# Patient Record
Sex: Male | Born: 1985 | Race: Black or African American | Hispanic: No | Marital: Single | Smoking: Current every day smoker
Health system: Southern US, Community
[De-identification: ages and names within clinical notes are randomized; demographics above are authoritative.]

## PROBLEM LIST (undated history)

## (undated) DIAGNOSIS — F32A Depression, unspecified: Secondary | ICD-10-CM

## (undated) DIAGNOSIS — F419 Anxiety disorder, unspecified: Secondary | ICD-10-CM

## (undated) HISTORY — DX: Anxiety disorder, unspecified: F41.9

## (undated) HISTORY — PX: NO PAST SURGERIES: SHX2092

## (undated) HISTORY — DX: Depression, unspecified: F32.A

---

## 2020-11-20 NOTE — Progress Notes (Signed)
Patient did not show for appointment.   

## 2020-11-21 ENCOUNTER — Telehealth: Payer: Self-pay | Admitting: Family

## 2020-11-21 ENCOUNTER — Encounter: Payer: Self-pay | Admitting: Family

## 2020-11-21 DIAGNOSIS — Z7689 Persons encountering health services in other specified circumstances: Secondary | ICD-10-CM

## 2020-11-24 ENCOUNTER — Ambulatory Visit (INDEPENDENT_AMBULATORY_CARE_PROVIDER_SITE_OTHER): Payer: Self-pay | Admitting: Physician Assistant

## 2020-11-24 ENCOUNTER — Other Ambulatory Visit: Payer: Self-pay

## 2020-11-24 ENCOUNTER — Ambulatory Visit (INDEPENDENT_AMBULATORY_CARE_PROVIDER_SITE_OTHER): Payer: Self-pay

## 2020-11-24 VITALS — BP 125/80 | HR 64 | Temp 97.4°F | Resp 18 | Ht 71.0 in | Wt 180.0 lb

## 2020-11-24 DIAGNOSIS — Z1159 Encounter for screening for other viral diseases: Secondary | ICD-10-CM

## 2020-11-24 DIAGNOSIS — Z114 Encounter for screening for human immunodeficiency virus [HIV]: Secondary | ICD-10-CM

## 2020-11-24 DIAGNOSIS — G8929 Other chronic pain: Secondary | ICD-10-CM

## 2020-11-24 DIAGNOSIS — M5441 Lumbago with sciatica, right side: Secondary | ICD-10-CM

## 2020-11-24 DIAGNOSIS — F411 Generalized anxiety disorder: Secondary | ICD-10-CM | POA: Insufficient documentation

## 2020-11-24 DIAGNOSIS — Z0000 Encounter for general adult medical examination without abnormal findings: Secondary | ICD-10-CM

## 2020-11-24 DIAGNOSIS — F5104 Psychophysiologic insomnia: Secondary | ICD-10-CM

## 2020-11-24 MED ORDER — HYDROXYZINE HCL 25 MG PO TABS: ORAL_TABLET | ORAL | 0 refills | Status: DC

## 2020-11-24 NOTE — Progress Notes (Signed)
**Note Randy-Identified via Obfuscation** New Patient Office Visit  Subjective:  Patient ID: Randy Braun, male    DOB: May 05, 1986  Age: 35 y.o. MRN: [id number]  CC:  Chief Complaint  Patient presents with   Establish Care    HPI Randy Braun presents as a new patient with several complaints.  Reports that he has been having elevated anxiety, difficulty sleeping, and back pain.  Reports that he has not been seen by a medical provider in 1 the past 20 years.  Reports that he has been experiencing elevated anxiety, believes that it is stress related.  States that when he gets upset his chest becomes tight, and then he will experience an anxiety attack he said his last one was on Friday, November 24, 2020, states it lasted approximately 20 minutes.  Endorses increased stressors, recently moved, starting a new job.  States that he does not sleep well, states that he will toss and turn most of the night, has used melatonin in the past with relief.  Reports that he has chronic low back pain, states that he was diagnosed with scoliosis when he was younger.  Reports that he will occasionally have sciatic pain involved as well.  Reports his back pain can usually be treated with ibuprofen with relief but has been using kratom as well.  Reports that he has been taking the kratom on a daily basis, hoping that it will also help him with his anxiety Reports that he uses 3 g every day    History reviewed. No pertinent past medical history.  History reviewed. No pertinent surgical history.  History reviewed. No pertinent family history.  Social History   Socioeconomic History   Marital status: Single    Spouse name: Not on file   Number of children: Not on file   Years of education: Not on file   Highest education level: Not on file  Occupational History   Not on file  Tobacco Use   Smoking status: Never Smoker   Smokeless tobacco: Never Used  Substance and Sexual Activity   Alcohol use: Not Currently   Drug use:  Never   Sexual activity: Yes  Other Topics Concern   Not on file  Social History Narrative   Not on file   Social Determinants of Health   Financial Resource Strain: Not on file  Food Insecurity: Not on file  Transportation Needs: Not on file  Physical Activity: Not on file  Stress: Not on file  Social Connections: Not on file  Intimate Partner Violence: Not on file    ROS Review of Systems  Constitutional: Negative.   HENT: Negative.   Eyes: Negative.   Respiratory: Negative.   Cardiovascular: Negative.   Gastrointestinal: Negative.   Endocrine: Negative.   Genitourinary: Negative.   Musculoskeletal: Positive for back pain.  Skin: Negative.   Allergic/Immunologic: Negative.   Neurological: Negative.   Hematological: Negative.   Psychiatric/Behavioral: Positive for sleep disturbance. Negative for self-injury and suicidal ideas. The patient is nervous/anxious.     Objective:   Today's Vitals: BP 125/80 (BP Location: Left Arm, Patient Position: Sitting, Cuff Size: Normal)    Pulse 64    Temp (!) 97.4 F (36.3 C) (Oral)    Resp 18    Ht 5\' 11"  (1.803 m)    Wt 180 lb (81.6 kg)    SpO2 98%    BMI 25.10 kg/m   Physical Exam Vitals and nursing note reviewed.   BP 125/80 (BP Location: Left Arm, Patient Position: Sitting,  Cuff Size: Normal)    Pulse 64    Temp (!) 97.4 F (36.3 C) (Oral)    Resp 18    Ht 5\' 11"  (1.803 m)    Wt 180 lb (81.6 kg)    SpO2 98%    BMI 25.10 kg/m   General Appearance:    Alert, cooperative, no distress, appears stated age  Head:    Normocephalic, without obvious abnormality, atraumatic  Eyes:    PERRL, conjunctiva/corneas clear, EOM's intact, fundi    benign, both eyes       Ears:    Normal TM's and external ear canals, both ears  Nose:   Nares normal, septum midline, mucosa normal, no drainage   or sinus tenderness  Throat:   Lips, mucosa, and tongue normal; teeth and gums normal  Neck:   Supple, symmetrical, trachea midline, no  adenopathy;       thyroid:  No enlargement/tenderness/nodules; no carotid   bruit or JVD  Back:     Symmetric, no curvature, ROM normal, no CVA tenderness  Lungs:     Clear to auscultation bilaterally, respirations unlabored  Chest wall:    No tenderness or deformity  Heart:    Regular rate and rhythm, S1 and S2 normal, no murmur, rub   or gallop           Extremities:   Extremities normal, atraumatic, no cyanosis or edema  Pulses:   2+ and symmetric all extremities  Skin:   Skin color, texture, turgor normal, no rashes or lesions  Lymph nodes:   Cervical, supraclavicular, and axillary nodes normal  Neurologic:   CNII-XII intact. Normal strength, sensation and reflexes      throughout     Assessment & Plan:   Problem List Items Addressed This Visit      Nervous and Auditory   Chronic right-sided low back pain with right-sided sciatica - Primary   Relevant Medications   hydrOXYzine (ATARAX/VISTARIL) 25 MG tablet   Other Relevant Orders   DG Lumbar Spine Complete (Completed)     Other   Anxiety state   Relevant Medications   hydrOXYzine (ATARAX/VISTARIL) 25 MG tablet   Other Relevant Orders   TSH   Vitamin D 1,25 dihydroxy   Psychophysiological insomnia   Relevant Orders   TSH    Other Visit Diagnoses    Wellness examination       Relevant Orders   CBC with Differential/Platelet   Comp. Metabolic Panel (12)   Screening for HIV without presence of risk factors       Relevant Orders   HIV antibody (with reflex)   Encounter for HCV screening test for low risk patient       Relevant Orders   HCV Ab w/Rflx to Verification      Outpatient Encounter Medications as of 11/24/2020  Medication Sig   hydrOXYzine (ATARAX/VISTARIL) 25 MG tablet Take 1 tab PO TID as needed for anxiety and take 1-2 tabs PO QHS PRN for insomnia   No facility-administered encounter medications on file as of 11/24/2020.   1. Chronic right-sided low back pain with right-sided sciatica Patient  education given, encouraged patient to work on discontinuing kratom - DG Lumbar Spine Complete  2. Anxiety state .  Trial hydroxyzine as needed during the day for anxiety, and at bedtime for insomnia - TSH - hydrOXYzine (ATARAX/VISTARIL) 25 MG tablet; Take 1 tab PO TID as needed for anxiety and take 1-2 tabs PO QHS PRN for insomnia  Dispense: 60 tablet; Refill: 0 - Vitamin D 1,25 dihydroxy  3. Psychophysiological insomnia  - TSH  4. Wellness examination  - CBC with Differential/Platelet - Comp. Metabolic Panel (12)  5. Screening for HIV without presence of risk factors  - HIV antibody (with reflex)  6. Encounter for HCV screening test for low risk patient  - HCV Ab w/Rflx to Verification  Patient given appointment to establish care with PCP, information for [place] financial assistance   I have reviewed the patient's medical history (PMH, PSH, Social History, Family History, Medications, and allergies) , and have been updated if relevant. I spent 30 minutes reviewing chart and  face to face time with patient.    Follow-up: Return in about 4 weeks (around 12/22/2020).   Kasandra Knudsen Mayers, PA-C

## 2020-11-24 NOTE — Patient Instructions (Signed)
For your anxiety, you can use 1/2 tablet of hydroxyzine, 12.5 mg 3 times a day.  For your insomnia, you can use 25 to 50 mg of the hydroxyzine.  Make sure that you have a quiet dark, cool sleeping environment without any electronics.  I encourage you to work on discontinuing use of the kratom.  We will call you with your lab results and x-ray results.  Please let us know if there is anything else we can do for you  Roney Jaffe, PA-C Physician Assistant [facility] [email or web address]    Managing Anxiety, Adult After being diagnosed with an anxiety disorder, you may be relieved to know why you have felt or behaved a certain way. You may also feel overwhelmed about the treatment ahead and what it will mean for your life. With care and support, you can manage this condition and recover from it. How to manage lifestyle changes Managing stress and anxiety Stress is your body's reaction to life changes and events, both good and bad. Most stress will last just a few hours, but stress can be ongoing and can lead to more than just stress. Although stress can play a major role in anxiety, it is not the same as anxiety. Stress is usually caused by something external, such as a deadline, test, or competition. Stress normally passes after the triggering event has ended.  Anxiety is caused by something internal, such as imagining a terrible outcome or worrying that something will go wrong that will devastate you. Anxiety often does not go away even after the triggering event is over, and it can become long-term (chronic) worry. It is important to understand the differences between stress and anxiety and to manage your stress effectively so that it does not lead to an anxious response. Talk with your health care provider or a counselor to learn more about reducing anxiety and stress. He or she may suggest tension reduction techniques, such as:  Music  therapy. This can include creating or listening to music that you enjoy and that inspires you.  Mindfulness-based meditation. This involves being aware of your normal breaths while not trying to control your breathing. It can be done while sitting or walking.  Centering prayer. This involves focusing on a word, phrase, or sacred image that means something to you and brings you peace.  Deep breathing. To do this, expand your stomach and inhale slowly through your nose. Hold your breath for 3-5 seconds. Then exhale slowly, letting your stomach muscles relax.  Self-talk. This involves identifying thought patterns that lead to anxiety reactions and changing those patterns.  Muscle relaxation. This involves tensing muscles and then relaxing them. Choose a tension reduction technique that suits your lifestyle and personality. These techniques take time and practice. Set aside 5-15 minutes a day to do them. Therapists can offer counseling and training in these techniques. The training to help with anxiety may be covered by some insurance plans. Other things you can do to manage stress and anxiety include:  Keeping a stress/anxiety diary. This can help you learn what triggers your reaction and then learn ways to manage your response.  Thinking about how you react to certain situations. You may not be able to control everything, but you can control your response.  Making time for activities that help you relax and not feeling guilty about spending your time in this way.  Visual imagery and yoga can help you stay calm and relax.   Medicines Medicines can help  ease symptoms. Medicines for anxiety include:  Anti-anxiety drugs.  Antidepressants. Medicines are often used as a primary treatment for anxiety disorder. Medicines will be prescribed by a health care provider. When used together, medicines, psychotherapy, and tension reduction techniques may be the most effective  treatment. Relationships Relationships can play a big part in helping you recover. Try to spend more time connecting with trusted friends and family members. Consider going to couples counseling, taking family education classes, or going to family therapy. Therapy can help you and others better understand your condition. How to recognize changes in your anxiety Everyone responds differently to treatment for anxiety. Recovery from anxiety happens when symptoms decrease and stop interfering with your daily activities at home or work. This may mean that you will start to:  Have better concentration and focus. Worry will interfere less in your daily thinking.  Sleep better.  Be less irritable.  Have more energy.  Have improved memory. It is important to recognize when your condition is getting worse. Contact your health care provider if your symptoms interfere with home or work and you feel like your condition is not improving. Follow these instructions at home: Activity  Exercise. Most adults should do the following: ? Exercise for at least 150 minutes each week. The exercise should increase your heart rate and make you sweat (moderate-intensity exercise). ? Strengthening exercises at least twice a week.  Get the right amount and quality of sleep. Most adults need 7-9 hours of sleep each night. Lifestyle  Eat a healthy diet that includes plenty of vegetables, fruits, whole grains, low-fat dairy products, and lean protein. Do not eat a lot of foods that are high in solid fats, added sugars, or salt.  Make choices that simplify your life.  Do not use any products that contain nicotine or tobacco, such as cigarettes, e-cigarettes, and chewing tobacco. If you need help quitting, ask your health care provider.  Avoid caffeine, alcohol, and certain over-the-counter cold medicines. These may make you feel worse. Ask your pharmacist which medicines to avoid.   General instructions  Take  over-the-counter and prescription medicines only as told by your health care provider.  Keep all follow-up visits as told by your health care provider. This is important. Where to find support You can get help and support from these sources:  Self-help groups.  Online and [employer].  A trusted spiritual leader.  Couples counseling.  Family education classes.  Family therapy. Where to find more information You may find that joining a support group helps you deal with your anxiety. The following sources can help you locate counselors or support groups near you:  Mental Health America: www.mentalhealthamerica.net  Anxiety and Depression Association of [place] (ADAA): ProgramCam.de  [employer] on Mental Illness (NAMI): www.nami.org Contact a health care provider if you:  Have a hard time staying focused or finishing daily tasks.  Spend many hours a day feeling worried about everyday life.  Become exhausted by worry.  Start to have headaches, feel tense, or have nausea.  Urinate more than normal.  Have diarrhea. Get help right away if you have:  A racing heart and shortness of breath.  Thoughts of hurting yourself or others. If you ever feel like you may hurt yourself or others, or have thoughts about taking your own life, get help right away. You can go to your nearest emergency department or call:  Your local emergency services (911 in the [place]  A suicide crisis helpline, such as the  National Suicide Prevention Lifeline at (865)670-2540. This is open 24 hours a day. Summary  Taking steps to learn and use tension reduction techniques can help calm you and help prevent triggering an anxiety reaction.  When used together, medicines, psychotherapy, and tension reduction techniques may be the most effective treatment.  Family, friends, and partners can play a big part in helping you recover from an anxiety disorder. This information is not  intended to replace advice given to you by your health care provider. Make sure you discuss any questions you have with your health care provider. Document Revised: 03/13/2019 Document Reviewed: 03/13/2019 Elsevier Patient Education  2021 Elsevier Inc.   Insomnia Insomnia is a sleep disorder that makes it difficult to fall asleep or stay asleep. Insomnia can cause fatigue, low energy, difficulty concentrating, mood swings, and poor performance at work or school. There are three different ways to classify insomnia:  Difficulty falling asleep.  Difficulty staying asleep.  Waking up too early in the morning. Any type of insomnia can be long-term (chronic) or short-term (acute). Both are common. Short-term insomnia usually lasts for three months or less. Chronic insomnia occurs at least three times a week for longer than three months. What are the causes? Insomnia may be caused by another condition, situation, or substance, such as:  Anxiety.  Certain medicines.  Gastroesophageal reflux disease (GERD) or other gastrointestinal conditions.  Asthma or other breathing conditions.  Restless legs syndrome, sleep apnea, or other sleep disorders.  Chronic pain.  Menopause.  Stroke.  Abuse of alcohol, tobacco, or illegal drugs.  Mental health conditions, such as depression.  Caffeine.  Neurological disorders, such as Alzheimer's disease.  An overactive thyroid (hyperthyroidism). Sometimes, the cause of insomnia may not be known. What increases the risk? Risk factors for insomnia include:  Gender. Women are affected more often than men.  Age. Insomnia is more common as you get older.  Stress.  Lack of exercise.  Irregular work schedule or working night shifts.  Traveling between different time zones.  Certain medical and mental health conditions. What are the signs or symptoms? If you have insomnia, the main symptom is having trouble falling asleep or having trouble  staying asleep. This may lead to other symptoms, such as:  Feeling fatigued or having low energy.  Feeling nervous about going to sleep.  Not feeling rested in the morning.  Having trouble concentrating.  Feeling irritable, anxious, or depressed. How is this diagnosed? This condition may be diagnosed based on:  Your symptoms and medical history. Your health care provider may ask about: ? Your sleep habits. ? Any medical conditions you have. ? Your mental health.  A physical exam. How is this treated? Treatment for insomnia depends on the cause. Treatment may focus on treating an underlying condition that is causing insomnia. Treatment may also include:  Medicines to help you sleep.  Counseling or therapy.  Lifestyle adjustments to help you sleep better. Follow these instructions at home: Eating and drinking  Limit or avoid alcohol, caffeinated beverages, and cigarettes, especially close to bedtime. These can disrupt your sleep.  Do not eat a large meal or eat spicy foods right before bedtime. This can lead to digestive discomfort that can make it hard for you to sleep.   Sleep habits  Keep a sleep diary to help you and your health care provider figure out what could be causing your insomnia. Write down: ? When you sleep. ? When you wake up during the night. ? How  well you sleep. ? How rested you feel the next day. ? Any side effects of medicines you are taking. ? What you eat and drink.  Make your bedroom a dark, comfortable place where it is easy to fall asleep. ? Put up shades or blackout curtains to block light from outside. ? Use a white noise machine to block noise. ? Keep the temperature cool.  Limit screen use before bedtime. This includes: ? Watching TV. ? Using your smartphone, tablet, or computer.  Stick to a routine that includes going to bed and waking up at the same times every day and night. This can help you fall asleep faster. Consider making a  quiet activity, such as reading, part of your nighttime routine.  Try to avoid taking naps during the day so that you sleep better at night.  Get out of bed if you are still awake after 15 minutes of trying to sleep. Keep the lights down, but try reading or doing a quiet activity. When you feel sleepy, go back to bed.   General instructions  Take over-the-counter and prescription medicines only as told by your health care provider.  Exercise regularly, as told by your health care provider. Avoid exercise starting several hours before bedtime.  Use relaxation techniques to manage stress. Ask your health care provider to suggest some techniques that may work well for you. These may include: ? Breathing exercises. ? Routines to release muscle tension. ? Visualizing peaceful scenes.  Make sure that you drive carefully. Avoid driving if you feel very sleepy.  Keep all follow-up visits as told by your health care provider. This is important. Contact a health care provider if:  You are tired throughout the day.  You have trouble in your daily routine due to sleepiness.  You continue to have sleep problems, or your sleep problems get worse. Get help right away if:  You have serious thoughts about hurting yourself or someone else. If you ever feel like you may hurt yourself or others, or have thoughts about taking your own life, get help right away. You can go to your nearest emergency department or call:  Your local emergency services (911 in the [place]  A suicide crisis helpline, such as the National Suicide Prevention Lifeline at 424 584 6013. This is open 24 hours a day. Summary  Insomnia is a sleep disorder that makes it difficult to fall asleep or stay asleep.  Insomnia can be long-term (chronic) or short-term (acute).  Treatment for insomnia depends on the cause. Treatment may focus on treating an underlying condition that is causing insomnia.  Keep a sleep diary to help you  and your health care provider figure out what could be causing your insomnia. This information is not intended to replace advice given to you by your health care provider. Make sure you discuss any questions you have with your health care provider. Document Revised: 08/21/2020 Document Reviewed: 08/21/2020 Elsevier Patient Education  2021 [employer].

## 2020-11-24 NOTE — Progress Notes (Signed)
Patient presents to establish care

## 2020-11-25 ENCOUNTER — Encounter: Payer: Self-pay | Admitting: Physician Assistant

## 2020-11-25 ENCOUNTER — Telehealth: Payer: Self-pay | Admitting: Physician Assistant

## 2020-11-25 NOTE — Telephone Encounter (Signed)
Pt would like to know his recent results from 11/24/20. My Chart was recommended for viewing  but would still like a call from office. Please advise and thank you.

## 2020-11-25 NOTE — Telephone Encounter (Signed)
Please inform patients of a 72 hr call back for results. His results were just drawn on yesterday.

## 2020-11-26 ENCOUNTER — Encounter: Payer: Self-pay | Admitting: Physician Assistant

## 2020-11-27 ENCOUNTER — Telehealth: Payer: Self-pay | Admitting: *Deleted

## 2020-11-27 NOTE — Telephone Encounter (Signed)
-----   Message from Roney Jaffe, [place] sent at 11/26/2020  1:18 PM EST ----- Please call patient and let him know that his x-ray did not show any abnormalities, and only mild scoliosis.

## 2020-11-27 NOTE — Telephone Encounter (Signed)
Patient verified DOB Patient is aware of xray showing no concerns only mild scoliosis. Patient is aware of lab results being provided 10 days after drawing due to send off vitamin d level.

## 2020-11-29 LAB — CBC WITH DIFFERENTIAL/PLATELET
Basophils Absolute: 0.1 10*3/uL (ref 0.0–0.2)
Basos: 1 %
EOS (ABSOLUTE): 0 10*3/uL (ref 0.0–0.4)
Eos: 1 %
Hematocrit: 43.3 % (ref 37.5–51.0)
Hemoglobin: 13.3 g/dL (ref 13.0–17.7)
Immature Grans (Abs): 0 10*3/uL (ref 0.0–0.1)
Immature Granulocytes: 0 %
Lymphocytes Absolute: 2.2 10*3/uL (ref 0.7–3.1)
Lymphs: 33 %
MCH: 24.8 pg — ABNORMAL LOW (ref 26.6–33.0)
MCHC: 30.7 g/dL — ABNORMAL LOW (ref 31.5–35.7)
MCV: 81 fL (ref 79–97)
Monocytes Absolute: 0.5 10*3/uL (ref 0.1–0.9)
Monocytes: 8 %
Neutrophils Absolute: 3.8 10*3/uL (ref 1.4–7.0)
Neutrophils: 57 %
Platelets: 178 10*3/uL (ref 150–450)
RBC: 5.37 x10E6/uL (ref 4.14–5.80)
RDW: 15.5 % — ABNORMAL HIGH (ref 11.6–15.4)
WBC: 6.6 10*3/uL (ref 3.4–10.8)

## 2020-11-29 LAB — COMP. METABOLIC PANEL (12)
AST: 23 IU/L (ref 0–40)
Albumin/Globulin Ratio: 2.5 — ABNORMAL HIGH (ref 1.2–2.2)
Albumin: 4.8 g/dL (ref 4.0–5.0)
Alkaline Phosphatase: 75 IU/L (ref 44–121)
BUN/Creatinine Ratio: 8 — ABNORMAL LOW (ref 9–20)
BUN: 7 mg/dL (ref 6–20)
Bilirubin Total: 0.3 mg/dL (ref 0.0–1.2)
Calcium: 9.3 mg/dL (ref 8.7–10.2)
Chloride: 103 mmol/L (ref 96–106)
Creatinine, Ser: 0.92 mg/dL (ref 0.76–1.27)
GFR calc Af Amer: 125 mL/min/{1.73_m2} (ref >59)
GFR calc non Af Amer: 108 mL/min/{1.73_m2} (ref >59)
Globulin, Total: 1.9 g/dL (ref 1.5–4.5)
Glucose: 89 mg/dL (ref 65–99)
Potassium: 4.5 mmol/L (ref 3.5–5.2)
Sodium: 141 mmol/L (ref 134–144)
Total Protein: 6.7 g/dL (ref 6.0–8.5)

## 2020-11-29 LAB — HIV ANTIBODY (ROUTINE TESTING W REFLEX): HIV Screen 4th Generation wRfx: NONREACTIVE

## 2020-11-29 LAB — HCV INTERPRETATION

## 2020-11-29 LAB — HCV AB W/RFLX TO VERIFICATION: HCV Ab: <0.1 s/co ratio (ref 0.0–0.9)

## 2020-11-29 LAB — VITAMIN D 1,25 DIHYDROXY
Vitamin D 1, 25 (OH)2 Total: 55 pg/mL
Vitamin D2 1, 25 (OH)2: <10 pg/mL
Vitamin D3 1, 25 (OH)2: 55 pg/mL

## 2020-11-29 LAB — TSH: TSH: 1.74 u[IU]/mL (ref 0.450–4.500)

## 2020-12-02 ENCOUNTER — Telehealth: Payer: Self-pay | Admitting: *Deleted

## 2020-12-02 NOTE — Telephone Encounter (Signed)
Patient verified DOB Patient is aware of labs being normal and no changes being made at this time. MA made patient aware of Financial appointment being this Friday at 4pm and to bring his completed application and paperwork to that appointment at [address].

## 2020-12-05 ENCOUNTER — Ambulatory Visit: Payer: Self-pay | Attending: Family Medicine

## 2020-12-05 ENCOUNTER — Other Ambulatory Visit: Payer: Self-pay

## 2020-12-29 ENCOUNTER — Telehealth: Payer: Self-pay

## 2020-12-29 NOTE — Telephone Encounter (Signed)
Copied from CRM [id number]. Topic: General - Other >> Dec 29, 2020 12:26 PM Gaetana Michaelis A wrote: Reason for CRM: Patient would like to be contacted regarding financial counseling Patient was unable to receive an orange card but continues to have financial difficulty   Patient had an appointment on 2/11 with you. Please follow up.

## 2020-12-30 ENCOUNTER — Telehealth: Payer: Self-pay | Admitting: General Practice

## 2020-12-30 NOTE — Telephone Encounter (Signed)
I return Pt call, he was inform that we still waiting for the cone application to be review

## 2021-01-12 ENCOUNTER — Telehealth: Payer: Self-pay | Admitting: General Practice

## 2021-01-12 NOTE — Telephone Encounter (Signed)
Pt. Has scheduled an appt. To establish care and he wants to speak with someone regarding mental health services. Pt. Saw Cari in January. Please follow up.

## 2021-01-15 NOTE — Telephone Encounter (Signed)
Placed call to patient. LCSW informed patient of services at [facility], which provides therapy and medication management to uninsured individuals. Pt requested LCSW to e-mail flyer to [email or web address]  Pt is requesting an internal referral from NP Zonia Kief, as his work schedule does not align with walk-in hours.

## 2021-01-16 NOTE — Telephone Encounter (Signed)
Referral placed per patient request

## 2021-01-16 NOTE — Addendum Note (Signed)
Addended by: Rema Fendt on: 01/16/2021 07:37 AM   Modules accepted: Orders

## 2021-01-23 ENCOUNTER — Encounter: Payer: Self-pay | Admitting: Family

## 2021-01-23 ENCOUNTER — Ambulatory Visit (INDEPENDENT_AMBULATORY_CARE_PROVIDER_SITE_OTHER): Payer: Self-pay | Admitting: Family

## 2021-01-23 ENCOUNTER — Other Ambulatory Visit: Payer: Self-pay

## 2021-01-23 VITALS — BP 106/70 | HR 79 | Ht 68.62 in | Wt 178.0 lb

## 2021-01-23 DIAGNOSIS — L989 Disorder of the skin and subcutaneous tissue, unspecified: Secondary | ICD-10-CM

## 2021-01-23 DIAGNOSIS — F5104 Psychophysiologic insomnia: Secondary | ICD-10-CM

## 2021-01-23 DIAGNOSIS — Z0120 Encounter for dental examination and cleaning without abnormal findings: Secondary | ICD-10-CM

## 2021-01-23 DIAGNOSIS — F411 Generalized anxiety disorder: Secondary | ICD-10-CM

## 2021-01-23 DIAGNOSIS — Z7689 Persons encountering health services in other specified circumstances: Secondary | ICD-10-CM

## 2021-01-23 NOTE — Patient Instructions (Addendum)
- Return for annual physical examination, labs, and health maintenance. Arrive fasting meaning having had no food and/or nothing to drink for at least 8 hours prior to appointment.  Please take scheduled medications as normal. - Referral to Psychiatry. [facility] [address] [place], [place] [address] Phone # [phone] [address]. - Referral to Dentistry. - Referral to Dermatology. Thank you for choosing Primary Care at [facility] for your medical home!    Randy Braun was seen by Rema Fendt, NP today.   Lajuan Byer's primary care provider is Rema Fendt, NP.   For the best care possible,  you should try to see Ricky Stabs, NP whenever you come to clinic.   We look forward to seeing you again soon!  If you have any questions about your visit today,  please call us at [phone]  Or feel free to reach your provider via MyChart.    DASH Eating Plan DASH stands for Dietary Approaches to Stop Hypertension. The DASH eating plan is a healthy eating plan that has been shown to:  Reduce high blood pressure (hypertension).  Reduce your risk for type 2 diabetes, heart disease, and stroke.  Help with weight loss. What are tips for following this plan? Reading food labels  Check food labels for the amount of salt (sodium) per serving. Choose foods with less than 5 percent of the Daily Value of sodium. Generally, foods with less than 300 milligrams (mg) of sodium per serving fit into this eating plan.  To find whole grains, look for the word "whole" as the first word in the ingredient list. Shopping  Buy products labeled as "low-sodium" or "no salt added."  Buy fresh foods. Avoid canned foods and pre-made or frozen meals. Cooking  Avoid adding salt when cooking. Use salt-free seasonings or herbs instead of table salt or sea salt. Check with your health care provider or pharmacist before using salt substitutes.  Do not fry foods.  Cook foods using healthy methods such as baking, boiling, grilling, roasting, and broiling instead.  Cook with heart-healthy oils, such as olive, canola, avocado, soybean, or sunflower oil. Meal planning  Eat a balanced diet that includes: ? 4 or more servings of fruits and 4 or more servings of vegetables each day. Try to fill one-half of your plate with fruits and vegetables. ? 6-8 servings of whole grains each day. ? Less than 6 oz (170 g) of lean meat, poultry, or fish each day. A 3-oz (85-g) serving of meat is about the same size as a deck of cards. One egg equals 1 oz (28 g). ? 2-3 servings of low-fat dairy each day. One serving is 1 cup (237 mL). ? 1 serving of nuts, seeds, or beans 5 times each week. ? 2-3 servings of heart-healthy fats. Healthy fats called omega-3 fatty acids are found in foods such as walnuts, flaxseeds, fortified milks, and eggs. These fats are also found in cold-water fish, such as sardines, salmon, and mackerel.  Limit how much you eat of: ? Canned or prepackaged foods. ? Food that is high in trans fat, such as some fried foods. ? Food that is high in saturated fat, such as fatty meat. ? Desserts and other sweets, sugary drinks, and other foods with added sugar. ? Full-fat dairy products.  Do not salt foods before eating.  Do not eat more than 4 egg yolks a week.  Try to eat at least 2 vegetarian meals a week.  Eat more home-cooked food and less restaurant, buffet, and fast food.   Lifestyle  When eating at a restaurant, ask that your food be prepared with less salt or no salt, if possible.  If you drink alcohol: ? Limit how much you use to:  0-1 drink a day for women who are not pregnant.  0-2 drinks a day for men. ? Be aware of how much alcohol is in your drink. In the [place] one drink equals one 12 oz bottle of beer (355 mL), one 5 oz glass of wine (148 mL), or one 1 oz glass of hard liquor (44 mL). General information  Avoid eating more than  2,300 mg of salt a day. If you have hypertension, you may need to reduce your sodium intake to 1,500 mg a day.  Work with your health care provider to maintain a healthy body weight or to lose weight. Ask what an ideal weight is for you.  Get at least 30 minutes of exercise that causes your heart to beat faster (aerobic exercise) most days of the week. Activities may include walking, swimming, or biking.  Work with your health care provider or dietitian to adjust your eating plan to your individual calorie needs. What foods should I eat? Fruits All fresh, dried, or frozen fruit. Canned fruit in natural juice (without added sugar). Vegetables Fresh or frozen vegetables (raw, steamed, roasted, or grilled). Low-sodium or reduced-sodium tomato and vegetable juice. Low-sodium or reduced-sodium tomato sauce and tomato paste. Low-sodium or reduced-sodium canned vegetables. Grains Whole-grain or whole-wheat bread. Whole-grain or whole-wheat pasta. Brown rice. Orpah Cobb. Bulgur. Whole-grain and low-sodium cereals. Pita bread. Low-fat, low-sodium crackers. Whole-wheat flour tortillas. Meats and other proteins Skinless chicken or [place]. Ground chicken or [place]. Pork with fat trimmed off. Fish and seafood. Egg whites. Dried beans, peas, or lentils. Unsalted nuts, nut butters, and seeds. Unsalted canned beans. Lean cuts of beef with fat trimmed off. Low-sodium, lean precooked or cured meat, such as sausages or meat loaves. Dairy Low-fat (1%) or fat-free (skim) milk. Reduced-fat, low-fat, or fat-free cheeses. Nonfat, low-sodium ricotta or cottage cheese. Low-fat or nonfat yogurt. Low-fat, low-sodium cheese. Fats and oils Soft margarine without trans fats. Vegetable oil. Reduced-fat, low-fat, or light mayonnaise and salad dressings (reduced-sodium). Canola, safflower, olive, avocado, soybean, and sunflower oils. Avocado. Seasonings and condiments Herbs. Spices. Seasoning mixes without salt. Other  foods Unsalted popcorn and pretzels. Fat-free sweets. The items listed above may not be a complete list of foods and beverages you can eat. Contact a dietitian for more information. What foods should I avoid? Fruits Canned fruit in a light or heavy syrup. Fried fruit. Fruit in cream or butter sauce. Vegetables Creamed or fried vegetables. Vegetables in a cheese sauce. Regular canned vegetables (not low-sodium or reduced-sodium). Regular canned tomato sauce and paste (not low-sodium or reduced-sodium). Regular tomato and vegetable juice (not low-sodium or reduced-sodium). Rosita Fire. Olives. Grains Baked goods made with fat, such as croissants, muffins, or some breads. Dry pasta or rice meal packs. Meats and other proteins Fatty cuts of meat. Ribs. Fried meat. Tomasa Blase. Bologna, salami, and other precooked or cured meats, such as sausages or meat loaves. Fat from the back of a pig (fatback). Bratwurst. Salted nuts and seeds. Canned beans with added salt. Canned or smoked fish. Whole eggs or egg yolks. Chicken or [place] with skin. Dairy Whole or 2% milk, cream, and half-and-half. Whole or full-fat cream cheese. Whole-fat or sweetened yogurt. Full-fat cheese. Nondairy creamers. Whipped toppings. Processed cheese and  cheese spreads. Fats and oils Butter. Stick margarine. Lard. Shortening. Ghee. Bacon fat. Tropical oils, such as coconut, palm kernel, or palm oil. Seasonings and condiments Onion salt, garlic salt, seasoned salt, table salt, and sea salt. Worcestershire sauce. Tartar sauce. Barbecue sauce. Teriyaki sauce. Soy sauce, including reduced-sodium. Steak sauce. Canned and packaged gravies. Fish sauce. Oyster sauce. Cocktail sauce. Store-bought horseradish. Ketchup. Mustard. Meat flavorings and tenderizers. Bouillon cubes. Hot sauces. Pre-made or packaged marinades. Pre-made or packaged taco seasonings. Relishes. Regular salad dressings. Other foods Salted popcorn and pretzels. The items listed above  may not be a complete list of foods and beverages you should avoid. Contact a dietitian for more information. Where to find more information  National Heart, Lung, and Blood Institute: PopSteam.is  American Heart Association: www.heart.org  Academy of Nutrition and Dietetics: www.eatright.org  National Kidney Foundation: www.kidney.org Summary  The DASH eating plan is a healthy eating plan that has been shown to reduce high blood pressure (hypertension). It may also reduce your risk for type 2 diabetes, heart disease, and stroke.  When on the DASH eating plan, aim to eat more fresh fruits and vegetables, whole grains, lean proteins, low-fat dairy, and heart-healthy fats.  With the DASH eating plan, you should limit salt (sodium) intake to 2,300 mg a day. If you have hypertension, you may need to reduce your sodium intake to 1,500 mg a day.  Work with your health care provider or dietitian to adjust your eating plan to your individual calorie needs. This information is not intended to replace advice given to you by your health care provider. Make sure you discuss any questions you have with your health care provider. Document Revised: 09/14/2019 Document Reviewed: 09/14/2019 Elsevier Patient Education  2021 [employer].

## 2021-01-23 NOTE — Progress Notes (Signed)
Establish care  Been having some mental health issues

## 2021-01-23 NOTE — Progress Notes (Signed)
Subjective:    Randy Braun - 35 y.o. male MRN [id number]  Date of birth: Oct 20, 1986  HPI  Randy Braun is to establish care. Patient has a PMH significant for chronic right-sided low back pain with right-sided sciatica, anxiety state, and psychological insomnia.   Current issues and/or concerns: 1. ANXIETY FOLLOW-UP: 11/24/2020 per PA note: - Trial hydroxyzine as needed during the day for anxiety, and at bedtime for insomnia  01/23/2021: Reports difficulty at his job where he works at a [employer]. Reports it was difficult for him to find this job. A friend of his helped him get the job and encouraged him that he would make new friends there.   Currently having issues with management. Reports he has to haggle customers to purchase items and he does not like that. He is paid hourly and by commission. He does not like arguing with customers and wants to be able to put his foot down when dealing with customers. Reports his manager told him that maybe he should find a new job. As a result 2 weeks ago he was suicidal with the intention of slitting his wrist. States he was able to "come out of the hole" by smoking marijuana and listening to motivational speaker Hassell Halim. He is still taking Hydroxyzine as needed. Reports sometimes taking Hydroxyzine too late at night and as a result he is tired the following morning. No longer using Kratom.  He is not suicidal or homicidal during today's visit. Requesting referral to Psychiatry.   2. DENTIST REQUEST: Swelling bilateral lower gums, some teeth need to be pulled, and impacted teeth. Requesting referral to Dentist.      3. SKIN CONCERNS: Bumps spontaneously appearing on left posterior upper arm and posterior lower neck. History of forehead bump since high school. Forehead bump usually worse when using a particular hair grease. Endorses he does pick with the bumps trying to pop them. Would like to have skin evaluated.     ROS per HPI     Health Maintenance:  There are no preventive care reminders to display for this patient.   Past Medical History: Patient Active Problem List   Diagnosis Date Noted  . Chronic right-sided low back pain with right-sided sciatica 11/24/2020  . Anxiety state 11/24/2020  . Psychophysiological insomnia 11/24/2020      Social History   reports that he has never smoked. He has never used smokeless tobacco. He reports previous alcohol use. He reports current drug use. Drug: Marijuana.   Family History  Family history is unknown by patient.   Medications: reviewed and updated   Objective:   Physical Exam BP 106/70 (BP Location: Left Arm, Patient Position: Sitting)   Pulse 79   Ht 5' 8.62" (1.743 m)   Wt 178 lb (80.7 kg)   SpO2 96%   BMI 26.58 kg/m  Physical Exam HENT:     Head: Normocephalic and atraumatic.  Eyes:     Extraocular Movements: Extraocular movements intact.     Conjunctiva/sclera: Conjunctivae normal.     Pupils: Pupils are equal, round, and reactive to light.  Cardiovascular:     Rate and Rhythm: Normal rate and regular rhythm.     Pulses: Normal pulses.     Heart sounds: Normal heart sounds.  Pulmonary:     Effort: Pulmonary effort is normal.     Breath sounds: Normal breath sounds.  Musculoskeletal:     Cervical back: Normal range of motion and neck supple.  Neurological:  General: No focal deficit present.     Mental Status: He is alert and oriented to person, place, and time.  Psychiatric:        Mood and Affect: Mood normal.        Behavior: Behavior normal.     Assessment & Plan:  1. Encounter to establish care: - Patient presents today to establish care.  - Return for annual physical examination, labs, and health maintenance. Arrive fasting meaning having had no food and/or nothing to drink for at least 8 hours prior to appointment.  Please take scheduled medications as normal.  2. Anxiety state: 3. Psychophysiological insomnia: -  Denies thoughts of self-harm, suicidal ideations, and homicidal ideations.  - Continue Hydroxyzine as prescribed.  - Referral to Psychiatry, [facility], placed 01/16/2021. Patient provided with Psychiatry referral address and phone number during today's visit. - Follow-up with primary provider as scheduled.  - hydrOXYzine (ATARAX/VISTARIL) 25 MG tablet; Take 1 tab PO TID as needed for anxiety and take 1-2 tabs PO QHS PRN for insomnia  Dispense: 60 tablet; Refill: 0  4. Encounter for routine dental examination: - Referral to Dentistry for further evaluation and management.  - Ambulatory referral to Dentistry  5. Bumps on skin: - Referral to Dermatology for further evaluation and management.  - Ambulatory referral to Dermatology   Patient was given clear instructions to go to Emergency Department or return to medical center if symptoms don't improve, worsen, or new problems develop.The patient verbalized understanding.  I discussed the assessment and treatment plan with the patient. The patient was provided an opportunity to ask questions and all were answered. The patient agreed with the plan and demonstrated an understanding of the instructions.   The patient was advised to call back or seek an in-person evaluation if the symptoms worsen or if the condition fails to improve as anticipated.    Ricky Stabs, NP 01/23/2021, 3:34 PM Primary Care at [facility]

## 2021-01-25 MED ORDER — HYDROXYZINE HCL 25 MG PO TABS: ORAL_TABLET | ORAL | 0 refills | Status: DC

## 2021-02-08 NOTE — Progress Notes (Signed)
Patient did not show for appointment.   

## 2021-02-09 ENCOUNTER — Encounter: Payer: Self-pay | Admitting: Family

## 2021-02-09 DIAGNOSIS — Z13220 Encounter for screening for lipoid disorders: Secondary | ICD-10-CM

## 2021-02-09 DIAGNOSIS — Z131 Encounter for screening for diabetes mellitus: Secondary | ICD-10-CM

## 2021-02-09 DIAGNOSIS — Z1329 Encounter for screening for other suspected endocrine disorder: Secondary | ICD-10-CM

## 2021-02-09 DIAGNOSIS — Z130 Encounter for screening for diseases of the blood and blood-forming organs and certain disorders involving the immune mechanism: Secondary | ICD-10-CM

## 2021-02-09 DIAGNOSIS — Z13228 Encounter for screening for other metabolic disorders: Secondary | ICD-10-CM

## 2021-02-09 DIAGNOSIS — Z0000 Encounter for general adult medical examination without abnormal findings: Secondary | ICD-10-CM

## 2021-02-25 NOTE — Progress Notes (Signed)
Patient ID: Randy Braun, male    DOB: 11-Sep-1986  MRN: [id number]  CC: Annual Physical Exam  Subjective: Randy Braun is a 35 y.o. male who presents for annual physical exam. His concerns today include:   1. KNEE PAIN: Duration: months Involved knee: left Mechanism of injury: none Location:diffuse Onset: gradual Radiation: no Aggravating factors: standing for long periods of time  Alleviating factors: Lidocaine patch  Status: fluctuating Treatments attempted: Lidocaine patch and ibuprofen  Relief with NSAIDs?:  mild Weakness with weight bearing or walking: no Sensation of giving way: no Locking: no Popping: no Bruising: no Swelling: no Redness: no Paresthesias/decreased sensation: no Fevers: no  2. ANXIETY STATE AND PSYCHOLOGICAL INSOMNIA FOLLOW-UP: 01/23/2021: - Continue Hydroxyzine as prescribed.  - Referral to Psychiatry, [facility], placed 01/16/2021. Patient provided with Psychiatry referral address and phone number during today's visit. - Follow-up with primary provider as scheduled.   02/27/2021: Requesting a decreased dose of Hydroxyzine. Reports does not take often. Reports when he does take medication during the day he feels very tired. Reports when he takes the medication at bedtime it is usually around 10:30 pm or 11:00 pm and difficult for him to get his morning started the next day. Reports he does plan to follow-up with Psychiatry soon but has been unable to make an appointment as of yet.  Depression screen [facility] 2/9 02/27/2021 01/23/2021 11/24/2020  Decreased Interest 1 2 3   Down, Depressed, Hopeless 1 3 3   PHQ - 2 Score 2 5 6   Altered sleeping 0 3 3  Tired, decreased energy 0 2 2  Change in appetite 1 1 2   Feeling bad or failure about yourself  0 3 3  Trouble concentrating 1 3 0  Moving slowly or fidgety/restless 0 2 2  Suicidal thoughts 0 3 0  PHQ-9 Score 4 22 18   Difficult doing work/chores Somewhat difficult Very  difficult -    Patient Active Problem List   Diagnosis Date Noted  . Chronic right-sided low back pain with right-sided sciatica 11/24/2020  . Anxiety state 11/24/2020  . Psychophysiological insomnia 11/24/2020     No current outpatient medications on file prior to visit.   No current facility-administered medications on file prior to visit.    Allergies  Allergen Reactions  . Vinegar [Acetic Acid] Other (See Comments)    Red Vinegar- flush    Social History   Socioeconomic History  . Marital status: Single    Spouse name: Not on file  . Number of children: Not on file  . Years of education: Not on file  . Highest education level: Not on file  Occupational History  . Not on file  Tobacco Use  . Smoking status: Never Smoker  . Smokeless tobacco: Never Used  Vaping Use  . Vaping Use: Every day  . Substances: Nicotine, THC  Substance and Sexual Activity  . Alcohol use: Not Currently  . Drug use: Yes    Types: Marijuana  . Sexual activity: Yes  Other Topics Concern  . Not on file  Social History Narrative  . Not on file   Social Determinants of Health   Financial Resource Strain: Not on file  Food Insecurity: Not on file  Transportation Needs: Not on file  Physical Activity: Not on file  Stress: Not on file  Social Connections: Not on file  Intimate Partner Violence: Not on file    Family History  Family history unknown: Yes    Past Surgical  History:  Procedure Laterality Date  . NO PAST SURGERIES      ROS: Review of Systems Negative except as stated above  PHYSICAL EXAM: BP 107/76 (BP Location: Left Arm, Patient Position: Sitting, Cuff Size: Normal)   Pulse (!) 57   Temp 98.4 F (36.9 C)   Resp 18   Ht 5' 8.62" (1.743 m)   Wt 178 lb 3.2 oz (80.8 kg)   SpO2 95%   BMI 26.61 kg/m   Wt Readings from Last 3 Encounters:  02/27/21 178 lb 3.2 oz (80.8 kg)  01/23/21 178 lb (80.7 kg)  11/24/20 180 lb (81.6 kg)    Physical Exam HENT:      Head: Normocephalic and atraumatic.     Right Ear: Tympanic membrane, ear canal and external ear normal.     Left Ear: Tympanic membrane, ear canal and external ear normal.     Nose: Nose normal.     Mouth/Throat:     Mouth: Mucous membranes are moist.     Pharynx: Oropharynx is clear.  Eyes:     Extraocular Movements: Extraocular movements intact.     Conjunctiva/sclera: Conjunctivae normal.     Pupils: Pupils are equal, round, and reactive to light.  Cardiovascular:     Rate and Rhythm: Bradycardia present.     Pulses: Normal pulses.     Heart sounds: Normal heart sounds.  Pulmonary:     Effort: Pulmonary effort is normal.     Breath sounds: Normal breath sounds.  Abdominal:     General: Bowel sounds are normal.     Palpations: Abdomen is soft.  Genitourinary:    Comments: Patient declined examination.  Musculoskeletal:        General: Normal range of motion.     Cervical back: Normal range of motion and neck supple.  Skin:    General: Skin is warm and dry.     Capillary Refill: Capillary refill takes less than 2 seconds.  Neurological:     General: No focal deficit present.     Mental Status: He is alert and oriented to person, place, and time.  Psychiatric:        Mood and Affect: Mood normal.        Behavior: Behavior normal.     ASSESSMENT AND PLAN: 1. Annual physical exam: - Counseled on 150 minutes of exercise per week as tolerated, healthy eating (including decreased daily intake of saturated fats, cholesterol, added sugars, sodium), STI prevention, and routine healthcare maintenance.  2. Screening for metabolic disorder: - CMP last obtained 11/24/2020.  3. Screening for deficiency anemia: - CBC last obtained 11/24/2020.  4. Diabetes mellitus screening: - Hemoglobin A1c to screen for pre-diabetes/diabetes. - Hemoglobin A1c  5. Screening cholesterol level: - Lipid panel to screen for high cholesterol.  - Lipid panel  6. Thyroid disorder screen: - TSH  last obtained 11/24/2020.  7. Anxiety state: 8. Psychophysiological insomnia - Stable. Denies thoughts of self-harm, suicidal ideations, and homicidal ideations.  - Decrease Hydroxyzine from 25 mg three times daily as needed to 10 mg daily at bedtime as needed. Patient with increased side effects of fatigue on previous dose.   - Counseled patient to try taking medication around 8:00 pm instead of 11:00 pm to see if this helps with side effect of fatigue. - Patient reports plans to follow-up with Psychiatry soon.  - Follow-up with primary provider in 4 weeks or sooner if needed.  - hydrOXYzine (ATARAX/VISTARIL) 10 MG tablet; Take 1 tablet (  10 mg total) by mouth at bedtime as needed.  Dispense: 30 tablet; Refill: 0  9. Acute pain of left knee: - Ibuprofen as prescribed. Take with food and plenty of water. Do not take any other products containing Ibuprofen, Motrin, Aleve, Naproxen, and Aspirin.  - Follow-up with primary provider as scheduled.  - ibuprofen (ADVIL) 600 MG tablet; Take 1 tablet (600 mg total) by mouth every 8 (eight) hours as needed.  Dispense: 30 tablet; Refill: 0  Patient was given the opportunity to ask questions.  Patient verbalized understanding of the plan and was able to repeat key elements of the plan. Patient was given clear instructions to go to Emergency Department or return to medical center if symptoms don't improve, worsen, or new problems develop.The patient verbalized understanding.   Orders Placed This Encounter  Procedures  . Lipid panel  . Hemoglobin A1c     Requested Prescriptions   Signed Prescriptions Disp Refills  . hydrOXYzine (ATARAX/VISTARIL) 10 MG tablet 30 tablet 0    Sig: Take 1 tablet (10 mg total) by mouth at bedtime as needed.  Marland Kitchen ibuprofen (ADVIL) 600 MG tablet 30 tablet 0    Sig: Take 1 tablet (600 mg total) by mouth every 8 (eight) hours as needed.    Return in about 4 weeks (around 03/27/2021) for Follow-Up anxiety.  Rema Fendt, NP

## 2021-02-27 ENCOUNTER — Other Ambulatory Visit: Payer: Self-pay

## 2021-02-27 ENCOUNTER — Encounter: Payer: Self-pay | Admitting: Family

## 2021-02-27 ENCOUNTER — Ambulatory Visit (INDEPENDENT_AMBULATORY_CARE_PROVIDER_SITE_OTHER): Payer: Self-pay | Admitting: Family

## 2021-02-27 VITALS — BP 107/76 | HR 57 | Temp 98.4°F | Resp 18 | Ht 68.62 in | Wt 178.2 lb

## 2021-02-27 DIAGNOSIS — F411 Generalized anxiety disorder: Secondary | ICD-10-CM

## 2021-02-27 DIAGNOSIS — Z13228 Encounter for screening for other metabolic disorders: Secondary | ICD-10-CM

## 2021-02-27 DIAGNOSIS — Z13220 Encounter for screening for lipoid disorders: Secondary | ICD-10-CM

## 2021-02-27 DIAGNOSIS — Z1329 Encounter for screening for other suspected endocrine disorder: Secondary | ICD-10-CM

## 2021-02-27 DIAGNOSIS — Z130 Encounter for screening for diseases of the blood and blood-forming organs and certain disorders involving the immune mechanism: Secondary | ICD-10-CM

## 2021-02-27 DIAGNOSIS — M25562 Pain in left knee: Secondary | ICD-10-CM

## 2021-02-27 DIAGNOSIS — Z131 Encounter for screening for diabetes mellitus: Secondary | ICD-10-CM

## 2021-02-27 DIAGNOSIS — Z0000 Encounter for general adult medical examination without abnormal findings: Secondary | ICD-10-CM

## 2021-02-27 DIAGNOSIS — F5104 Psychophysiologic insomnia: Secondary | ICD-10-CM

## 2021-02-27 MED ORDER — IBUPROFEN 600 MG PO TABS: 600.0000 mg | ORAL_TABLET | Freq: Three times a day (TID) | ORAL | 0 refills | Status: DC | PRN

## 2021-02-27 MED ORDER — HYDROXYZINE HCL 10 MG PO TABS: 10.0000 mg | ORAL_TABLET | Freq: Every evening | ORAL | 0 refills | Status: DC | PRN

## 2021-02-27 NOTE — Patient Instructions (Signed)
 Annual physical exam and labs today. Preventive Care 35-35 Years Old, Male Preventive care refers to lifestyle choices and visits with your health care provider that can promote health and wellness. This includes:  A yearly physical exam. This is also called an annual wellness visit.  Regular dental and eye exams.  Immunizations.  Screening for certain conditions.  Healthy lifestyle choices, such as: ? Eating a healthy diet. ? Getting regular exercise. ? Not using drugs or products that contain nicotine and tobacco. ? Limiting alcohol use. What can I expect for my preventive care visit? Physical exam Your health care provider may check your:  Height and weight. These may be used to calculate your BMI (body mass index). BMI is a measurement that tells if you are at a healthy weight.  Heart rate and blood pressure.  Body temperature.  Skin for abnormal spots. Counseling Your health care provider may ask you questions about your:  Past medical problems.  Family's medical history.  Alcohol, tobacco, and drug use.  Emotional well-being.  Home life and relationship well-being.  Sexual activity.  Diet, exercise, and sleep habits.  Work and work environment.  Access to firearms. What immunizations do I need? Vaccines are usually given at various ages, according to a schedule. Your health care provider will recommend vaccines for you based on your age, medical history, and lifestyle or other factors, such as travel or where you work.   What tests do I need? Blood tests  Lipid and cholesterol levels. These may be checked every 5 years starting at age 20.  Hepatitis C test.  Hepatitis B test. Screening  Diabetes screening. This is done by checking your blood sugar (glucose) after you have not eaten for a while (fasting).  Genital exam to check for testicular cancer or hernias.  STD (sexually transmitted disease) testing, if you are at risk. Talk with your  health care provider about your test results, treatment options, and if necessary, the need for more tests.   Follow these instructions at home: Eating and drinking  Eat a healthy diet that includes fresh fruits and vegetables, whole grains, lean protein, and low-fat dairy products.  Drink enough fluid to keep your urine pale yellow.  Take vitamin and mineral supplements as recommended by your health care provider.  Do not drink alcohol if your health care provider tells you not to drink.  If you drink alcohol: ? Limit how much you have to 0-2 drinks a day. ? Be aware of how much alcohol is in your drink. In the [place] one drink equals one 12 oz bottle of beer (355 mL), one 5 oz glass of wine (148 mL), or one 1 oz glass of hard liquor (44 mL).   Lifestyle  Take daily care of your teeth and gums. Brush your teeth every morning and night with fluoride toothpaste. Floss one time each day.  Stay active. Exercise for at least 30 minutes 5 or more days each week.  Do not use any products that contain nicotine or tobacco, such as cigarettes, e-cigarettes, and chewing tobacco. If you need help quitting, ask your health care provider.  Do not use drugs.  If you are sexually active, practice safe sex. Use a condom or other form of protection to prevent STIs (sexually transmitted infections).  Find healthy ways to cope with stress, such as: ? Meditation, yoga, or listening to music. ? Journaling. ? Talking to a trusted person. ? Spending time with friends and family. Safety    Always wear your seat belt while driving or riding in a vehicle.  Do not drive: ? If you have been drinking alcohol. Do not ride with someone who has been drinking. ? When you are tired or distracted. ? While texting.  Wear a helmet and other protective equipment during sports activities.  If you have firearms in your house, make sure you follow all gun safety procedures.  Seek help if you have been physically  or sexually abused. What's next?  Go to your health care provider once a year for an annual wellness visit.  Ask your health care provider how often you should have your eyes and teeth checked.  Stay up to date on all vaccines. This information is not intended to replace advice given to you by your health care provider. Make sure you discuss any questions you have with your health care provider. Document Revised: 06/27/2019 Document Reviewed: 10/05/2018 Elsevier Patient Education  2021 [employer].

## 2021-02-27 NOTE — Progress Notes (Signed)
Physical Left knee pain-stands all day at work  Wants lower dose of hydroxyzine if that is an option

## 2021-02-28 LAB — HEMOGLOBIN A1C
Est. average glucose Bld gHb Est-mCnc: 120 mg/dL
Hgb A1c MFr Bld: 5.8 % — ABNORMAL HIGH (ref 4.8–5.6)

## 2021-02-28 LAB — LIPID PANEL
Chol/HDL Ratio: 2.4 ratio (ref 0.0–5.0)
Cholesterol, Total: 134 mg/dL (ref 100–199)
HDL: 55 mg/dL (ref >39)
LDL Chol Calc (NIH): 67 mg/dL (ref 0–99)
Triglycerides: 57 mg/dL (ref 0–149)
VLDL Cholesterol Cal: 12 mg/dL (ref 5–40)

## 2021-02-28 NOTE — Progress Notes (Signed)
Cholesterol normal.   Hemoglobin A1c is consistent with pre-diabetes. Practice healthy eating habits of fresh fruit and vegetables, lean baked meats such as chicken, fish, and [place]; limit breads, rice, pastas, and desserts; practice regular aerobic exercise (at least 150 minutes a week as tolerated). Patient encouraged to have rechecked in 6 months.

## 2021-03-26 ENCOUNTER — Other Ambulatory Visit: Payer: Self-pay

## 2021-03-26 ENCOUNTER — Ambulatory Visit (INDEPENDENT_AMBULATORY_CARE_PROVIDER_SITE_OTHER): Payer: Self-pay | Admitting: Family Medicine

## 2021-03-26 DIAGNOSIS — L729 Follicular cyst of the skin and subcutaneous tissue, unspecified: Secondary | ICD-10-CM

## 2021-03-26 NOTE — Assessment & Plan Note (Signed)
Punch biopsy performed today to area of concern in the left upper lateral arm.  No complications.  Area appeared benign, thus did not send for pathology.  Postprocedural skin care instructions given to patient.

## 2021-03-26 NOTE — Progress Notes (Signed)
Patient ID: Randy Braun, male    DOB: Apr 25, 1986  MRN: [id number]  CC: Anxiety Follow-Up   Subjective: Randy Braun is a 35 y.o. male who presents for anxiety follow-up.   His concerns today include:   1. ANXIETY FOLLOW-UP: 02/27/2021: - Decrease Hydroxyzine from 25 mg three times daily as needed to 10 mg daily at bedtime as needed. Patient with increased side effects of fatigue on previous dose.   - Counseled patient to try taking medication around 8:00 pm instead of 11:00 pm to see if this helps with side effect of fatigue. - Patient reports plans to follow-up with Psychiatry soon.  - Follow-up with primary provider in 4 weeks or sooner if needed.   03/27/2021: Doing well on current dose of Hydroxyzine.  2. BACK PAIN: Location: upper back Onset: 1 week Description: feels like he pulled something, does heavy lifting  Radiation: neck Better with: nothing Trauma: no Popping/clicking sounds with movement/bending: no Muscle spasms: no   Depression screen [facility] 2/9 03/27/2021 02/27/2021 01/23/2021 11/24/2020  Decreased Interest 1 1 2 3   Down, Depressed, Hopeless 1 1 3 3   PHQ - 2 Score 2 2 5 6   Altered sleeping 1 0 3 3  Tired, decreased energy 0 0 2 2  Change in appetite 0 1 1 2   Feeling bad or failure about yourself  0 0 3 3  Trouble concentrating 0 1 3 0  Moving slowly or fidgety/restless 0 0 2 2  Suicidal thoughts 0 0 3 0  PHQ-9 Score 3 4 22 18   Difficult doing work/chores Not difficult at all Somewhat difficult Very difficult -     Patient Active Problem List   Diagnosis Date Noted  . Cyst of skin 03/26/2021  . Chronic right-sided low back pain with right-sided sciatica 11/24/2020  . Anxiety state 11/24/2020  . Psychophysiological insomnia 11/24/2020     Current Outpatient Medications on File Prior to Visit  Medication Sig Dispense Refill  . ibuprofen (ADVIL) 600 MG tablet Take 1 tablet (600 mg total) by mouth every 8 (eight) hours as needed. 30 tablet 0   No  current facility-administered medications on file prior to visit.    Allergies  Allergen Reactions  . Vinegar [Acetic Acid] Other (See Comments)    Red Vinegar- flush    Social History   Socioeconomic History  . Marital status: Single    Spouse name: Not on file  . Number of children: Not on file  . Years of education: Not on file  . Highest education level: Not on file  Occupational History  . Not on file  Tobacco Use  . Smoking status: Never Smoker  . Smokeless tobacco: Never Used  Vaping Use  . Vaping Use: Every day  . Substances: Nicotine, THC  Substance and Sexual Activity  . Alcohol use: Not Currently  . Drug use: Yes    Types: Marijuana  . Sexual activity: Yes  Other Topics Concern  . Not on file  Social History Narrative  . Not on file   Social Determinants of Health   Financial Resource Strain: Not on file  Food Insecurity: Not on file  Transportation Needs: Not on file  Physical Activity: Not on file  Stress: Not on file  Social Connections: Not on file  Intimate Partner Violence: Not on file    Family History  Family history unknown: Yes    Past Surgical History:  Procedure Laterality Date  . NO PAST SURGERIES  ROS: Review of Systems Negative except as stated above  PHYSICAL EXAM: BP 107/73 (BP Location: Left Arm, Patient Position: Sitting, Cuff Size: Normal)   Pulse 65   Temp 98.4 F (36.9 C)   Resp 15   Ht 5' 8.62" (1.743 m)   Wt 176 lb 3.2 oz (79.9 kg)   SpO2 98%   BMI 26.31 kg/m   Physical Exam HENT:     Head: Normocephalic and atraumatic.  Eyes:     Extraocular Movements: Extraocular movements intact.     Conjunctiva/sclera: Conjunctivae normal.     Pupils: Pupils are equal, round, and reactive to light.  Cardiovascular:     Rate and Rhythm: Normal rate and regular rhythm.     Pulses: Normal pulses.     Heart sounds: Normal heart sounds.  Pulmonary:     Effort: Pulmonary effort is normal.     Breath sounds: Normal  breath sounds.  Abdominal:     General: Bowel sounds are normal.     Palpations: Abdomen is soft.  Musculoskeletal:     Cervical back: Normal range of motion and neck supple.  Skin:    Capillary Refill: Capillary refill takes less than 2 seconds.  Neurological:     General: No focal deficit present.     Mental Status: He is alert and oriented to person, place, and time.  Psychiatric:        Mood and Affect: Mood normal.        Behavior: Behavior normal.     ASSESSMENT AND PLAN: 1. Anxiety state: 2. Psychophysiological insomnia: - Patient denies thoughts of self-harm, suicidal ideations, and homicidal ideations.   - Continue Hydroxyzine as prescribed.  - Follow-up with primary provider as scheduled.  - hydrOXYzine (ATARAX/VISTARIL) 10 MG tablet; Take 1 tablet (10 mg total) by mouth at bedtime as needed.  Dispense: 30 tablet; Refill: 1  3. Upper back pain: - Meloxicam as prescribed.  - Follow-up with primary provider in 4 weeks or sooner if needed. - meloxicam (MOBIC) 7.5 MG tablet; Take 1 tablet (7.5 mg total) by mouth daily.  Dispense: 30 tablet; Refill: 0   Patient was given the opportunity to ask questions.  Patient verbalized understanding of the plan and was able to repeat key elements of the plan. Patient was given clear instructions to go to Emergency Department or return to medical center if symptoms don't improve, worsen, or new problems develop.The patient verbalized understanding.    Requested Prescriptions   Signed Prescriptions Disp Refills  . meloxicam (MOBIC) 7.5 MG tablet 30 tablet 0    Sig: Take 1 tablet (7.5 mg total) by mouth daily.  . hydrOXYzine (ATARAX/VISTARIL) 10 MG tablet 30 tablet 1    Sig: Take 1 tablet (10 mg total) by mouth at bedtime as needed.    Return in about 4 weeks (around 04/24/2021) for Follow-Up back pain.  Rema Fendt, NP

## 2021-03-26 NOTE — Progress Notes (Signed)
    SUBJECTIVE:   CHIEF COMPLAINT / HPI:   Skin Concerns Patient presents today to discuss multiple areas of skin concerns.  States that several years ago he had a spot on his forehead that he believed was a pimple.  Initially, he did not do anything about it.  Once people started pointing it out repeatedly, states he took it to himself to take care of it.  Took a pen to the area and opened it up.  Since that time, he is noticed intermittent draining from the area.  He feels that it keeps coming up.  Lately, within the last month, he has noticed some foul odor to the area which has been most concerning to him.  Also notes a spot on the lateral aspect of his left arm which he has also picked at.  Feels that he did not completely get all of it out and it is bothersome to him.  There is no pain to the area and it is not growing.   Also notes an area to the back of his head that was similar. States that he had to put off getting a haircut because he was concerned that this area would bleed with the barbers blade.  He did feel somewhat embarrassed as well and also put off a haircut because of that.  He would like these areas removed.  PERTINENT  PMH / PSH:  Past Medical History:  Diagnosis Date  . Anxiety   . Depression     OBJECTIVE:   BP 100/60   Pulse 61   Ht 5\' 11"  (1.803 m)   Wt 179 lb 3.2 oz (81.3 kg)   SpO2 98%   BMI 24.99 kg/m    General: NAD, pleasant, able to participate in exam Respiratory: In no respiratory distress, breathing comfortably on room air Skin: Forehead without any lesions or bumps. No skin openings. Back of neck also without any obvious bump or opening of skin. Left forearm with 0.3x0.3 cm area that is hardened without erythema or pain on palpation with open comedone beneath  Psych: Normal affect and mood  Skin punch biopsy:   After informed written consent was obtained, using alcohol and Betadine for cleansing and 1% Lidocaine with epinephrine for anesthetic,  with sterile technique a 3 mm punch biopsy was used to obtain a biopsy specimen of the lesion. Lesion appeared benign and was not sent for pathology. Hemostasis was obtained by silver nitrate.   Antibiotic dressing was applied and area was covered with bandage. Wound care instructions provided. The procedure was well tolerated without complications.    ASSESSMENT/PLAN:   Cyst of skin Punch biopsy performed today to area of concern in the left upper lateral arm.  No complications.  Area appeared benign, thus did not send for pathology.  Postprocedural skin care instructions given to patient.         , DO  [facility]

## 2021-03-26 NOTE — Patient Instructions (Signed)
It was wonderful to meet you today. Thank you for allowing me to be a part of your care. Below is a short summary of what we discussed at your visit today:  Cyst on arm - the cyst on your left arm looks like a benign cyst - we removed it completely so it has about a 1% chance of recurring - take care of the site and keep it clean and dry - you should expect to heal within a week  If concerns - give [place] a call if the site develops warmth, redness, pain, or swelling - these signs may indicate and infection    If you have any questions or concerns, please do not hesitate to contact us via phone or MyChart message.   Fayette Pho, MD

## 2021-03-26 NOTE — Progress Notes (Deleted)
    SUBJECTIVE:   CHIEF COMPLAINT / HPI:   ***  PERTINENT  PMH / PSH: ***  OBJECTIVE:   BP 100/60   Pulse 61   Ht 5\' 11"  (1.803 m)   Wt 179 lb 3.2 oz (81.3 kg)   SpO2 98%   BMI 24.99 kg/m   ***  ASSESSMENT/PLAN:   No problem-specific Assessment & Plan notes found for this encounter.     , MD [facility]   {    This will disappear when note is signed, click to select method of visit    :1}

## 2021-03-27 ENCOUNTER — Ambulatory Visit (INDEPENDENT_AMBULATORY_CARE_PROVIDER_SITE_OTHER): Payer: Self-pay | Admitting: Family

## 2021-03-27 ENCOUNTER — Encounter: Payer: Self-pay | Admitting: Family

## 2021-03-27 VITALS — BP 107/73 | HR 65 | Temp 98.4°F | Resp 15 | Ht 68.62 in | Wt 176.2 lb

## 2021-03-27 DIAGNOSIS — M549 Dorsalgia, unspecified: Secondary | ICD-10-CM

## 2021-03-27 DIAGNOSIS — F411 Generalized anxiety disorder: Secondary | ICD-10-CM

## 2021-03-27 DIAGNOSIS — F5104 Psychophysiologic insomnia: Secondary | ICD-10-CM

## 2021-03-27 MED ORDER — MELOXICAM 7.5 MG PO TABS: 7.5000 mg | ORAL_TABLET | Freq: Every day | ORAL | 0 refills | Status: DC

## 2021-03-27 NOTE — Progress Notes (Signed)
Anxiety f/u-pt feels he doing well with medication Mild pain in upper back started last week feels like something pressing on nerve per pt

## 2021-03-28 MED ORDER — HYDROXYZINE HCL 10 MG PO TABS: 10.0000 mg | ORAL_TABLET | Freq: Every evening | ORAL | 1 refills | Status: DC | PRN

## 2021-04-23 ENCOUNTER — Ambulatory Visit
Admission: EM | Admit: 2021-04-23 | Discharge: 2021-04-23 | Disposition: A | Discharge status: Home / Self Care | Payer: Self-pay | Attending: Internal Medicine | Admitting: Internal Medicine

## 2021-04-23 ENCOUNTER — Ambulatory Visit (INDEPENDENT_AMBULATORY_CARE_PROVIDER_SITE_OTHER): Payer: Self-pay

## 2021-04-23 ENCOUNTER — Encounter: Payer: Self-pay | Admitting: *Deleted

## 2021-04-23 ENCOUNTER — Telehealth: Payer: Self-pay | Admitting: Family

## 2021-04-23 DIAGNOSIS — M542 Cervicalgia: Secondary | ICD-10-CM

## 2021-04-23 DIAGNOSIS — S46812A Strain of other muscles, fascia and tendons at shoulder and upper arm level, left arm, initial encounter: Secondary | ICD-10-CM

## 2021-04-23 MED ORDER — PREDNISONE 20 MG PO TABS: 40.0000 mg | ORAL_TABLET | Freq: Every day | ORAL | 0 refills | Status: AC

## 2021-04-23 NOTE — ED Provider Notes (Addendum)
[facility]    CSN: [id number] Arrival date & time: 04/23/21  1603      History   Chief Complaint Chief Complaint  Patient presents with   Neck Pain    HPI Randy Braun is a 35 y.o. male.   Patient presents to the urgent care for 1 month history of posterior cervical neck pain.  Denies any injury to neck.  Patient describes pain as a sharp pain that radiates down left arm and is causing numbness and tingling to left arm.  Denies chest pain or shortness of breath.  Numbness and tingling to left arm is primarily when patient is in the sitting position but has become more constant in any position throughout the past week.  Patient reports that he "popped" his neck about a week ago and now has greater range of motion of his neck but that pain has become more severe.  Pain becomes more severe with extension of neck.  Patient states that he does not routinely lift heavy objects at work or on a daily basis.  Denies any prior history of neck pain but does state that he has a history of scoliosis that was diagnosed in his teen years.  Patient was seen by PCP on 03/27/2021 where he complained of upper back pain.  Was prescribed meloxicam to take as needed for pain.  Patient states that he has been taking meloxicam as needed but that has not helped with his pain and last dose was approximately 2 days ago.   Neck Pain  Past Medical History:  Diagnosis Date   Anxiety    Depression     Patient Active Problem List   Diagnosis Date Noted   Cyst of skin 03/26/2021   Chronic right-sided low back pain with right-sided sciatica 11/24/2020   Anxiety state 11/24/2020   Psychophysiological insomnia 11/24/2020    Past Surgical History:  Procedure Laterality Date   NO PAST SURGERIES         Home Medications    Prior to Admission medications   Medication Sig Start Date End Date Taking? Authorizing Provider  hydrOXYzine (ATARAX/VISTARIL) 10 MG tablet Take 1 tablet (10 mg total) by  mouth at bedtime as needed. 03/28/21  Yes Zonia Kief, Amy J, NP  predniSONE (DELTASONE) 20 MG tablet Take 2 tablets (40 mg total) by mouth daily for 5 days. 04/23/21 04/28/21 Yes Lance Muss, FNP    Family History Family History  Family history unknown: Yes    Social History Social History   Tobacco Use   Smoking status: Never   Smokeless tobacco: Never  Vaping Use   Vaping Use: Every day   Substances: Nicotine, THC  Substance Use Topics   Alcohol use: Yes    Comment: occasionally   Drug use: Yes    Types: Marijuana     Allergies   Vinegar [acetic acid]   Review of Systems Review of Systems  Musculoskeletal:  Positive for neck pain.  Per HPI  Physical Exam Triage Vital Signs ED Triage Vitals [04/23/21 1624]  Enc Vitals Group     BP 113/66     Pulse Rate 84     Resp 16     Temp 98.4 F (36.9 C)     Temp Source Temporal     SpO2 96 %     Weight      Height      Head Circumference      Peak Flow      Pain Score  6     Pain Loc      Pain Edu?      Excl. in GC?    No data found.  Updated Vital Signs BP 113/66   Pulse 84   Temp 98.4 F (36.9 C) (Temporal)   Resp 16   SpO2 96%   Visual Acuity Right Eye Distance:   Left Eye Distance:   Bilateral Distance:    Right Eye Near:   Left Eye Near:    Bilateral Near:     Physical Exam Constitutional:      Appearance: Normal appearance.  HENT:     Head: Normocephalic and atraumatic.  Eyes:     Extraocular Movements: Extraocular movements intact.     Conjunctiva/sclera: Conjunctivae normal.  Pulmonary:     Effort: Pulmonary effort is normal.  Musculoskeletal:     Right upper arm: Normal.     Left upper arm: Normal. No tenderness.     Cervical back: Normal range of motion. Tenderness present. No edema or erythema. Pain with movement and muscular tenderness present. No spinous process tenderness.     Thoracic back: Normal.     Lumbar back: Normal.     Comments: Pain present on palpation to left  trapezius muscle directly to left of C7.  Patient also has pain to palpation directly over left scapula.  Normal sensation to the left upper extremity.Reflexes intact.   Neurological:     General: No focal deficit present.     Mental Status: He is alert and oriented to person, place, and time. Mental status is at baseline.  Psychiatric:        Mood and Affect: Mood normal.        Behavior: Behavior normal.        Thought Content: Thought content normal.        Judgment: Judgment normal.     UC Treatments / Results  Labs (all labs ordered are listed, but only abnormal results are displayed) Labs Reviewed - No data to display  EKG   Radiology DG Cervical Spine Complete  Result Date: 04/23/2021 CLINICAL DATA:  Neck pain for 1 month without known injury. EXAM: CERVICAL SPINE - COMPLETE 4+ VIEW COMPARISON:  None. FINDINGS: There is no evidence of cervical spine fracture or prevertebral soft tissue swelling. Alignment is normal. No other significant bone abnormalities are identified. IMPRESSION: Negative cervical spine radiographs. Electronically Signed   By: Lupita Raider M.D.   On: 04/23/2021 17:48    Procedures Procedures (including critical care time)  Medications Ordered in UC Medications - No data to display  Initial Impression / Assessment and Plan / UC Course  I have reviewed the triage vital signs and the nursing notes.  Pertinent labs & imaging results that were available during my care of the patient were reviewed by me and considered in my medical decision making (see chart for details).     Cervical spine x-ray negative for fracture or any bony abnormality.  Due to area of tenderness during palpation on physical exam, suspect strained muscle at trapezius that is resulting in patient's symptoms.  Will treat with steroid to decrease inflammation.  Patient was advised to avoid ibuprofen or any other NSAIDs while taking prednisone.  Patient to alternate ice and heat.   Suspicious that patient history of scoliosis could be causing strain on neck musculature that is resulting in patient pain and symptoms. Patient to follow-up with PCP or orthopedic specialist if symptoms do not improve or to  go to the hospital if symptoms significantly worsen.Discussed strict return precautions. Patient verbalized understanding and is agreeable with plan.  Final Clinical Impressions(s) / UC Diagnoses   Final diagnoses:  Trapezius muscle strain, left, initial encounter  Neck pain     Discharge Instructions      Pain is most likely a muscular strain. Pain may take 1-2 weeks to resolve.  You have been prescribed prednisone which is a steroid to decrease pain and inflammation.  Please alternate using ice/heating pad to help with any discomfort.   Please follow up with primary care or orthopedist if symptoms not improving in 2 weeks. Please return sooner if pain changes or worsens.       ED Prescriptions     Medication Sig Dispense Auth. Provider   predniSONE (DELTASONE) 20 MG tablet Take 2 tablets (40 mg total) by mouth daily for 5 days. 10 tablet Lance Muss, FNP      PDMP not reviewed this encounter.   Lance Muss, FNP 04/23/21 1812    Lance Muss, FNP 04/23/21 1813    Lance Muss, FNP 04/23/21 1836

## 2021-04-23 NOTE — ED Triage Notes (Signed)
C/O starting with left posterior neck pain approx 1 month ago without known injury; states when he attempted to do neck stretches to help with the pain, the pain became much worse, and has continued.  States now left posterior neck pain radiating down into left posterior shoulder and down into LUE with some numbness and tingling into LUE.

## 2021-04-23 NOTE — Progress Notes (Signed)
Patient ID: Randy Braun, male    DOB: 07-26-1986  MRN: [id number]  CC: Neck Pain Follow-Up   Subjective: Randy Braun is a 35 y.o. male who presents for neck pain follow-up.  His concerns today include:   NECK PAIN FOLLOW-UP: 03/27/2021: Meloxicam and follow-up in 4 weeks.   04/23/2021: Pt called in stating he is still having left side neck pain and now it is going into his left shoulder. Pt states he is having trouble sleeping now. He states his left arm is tingling and becoming numb and he is starting to have issues in the back area. I informed pt he has appointment tomorrow 7.1.2022 and to also make sure he lets his PCP know this same information.   Visit 04/23/2021 at [facility] at [facility] per NP note: Cervical spine x-ray negative for fracture or any bony abnormality.  Due to area of tenderness during palpation on physical exam, suspect strained muscle at trapezius that is resulting in patient's symptoms.  Will treat with steroid to decrease inflammation.  Patient was advised to avoid ibuprofen or any other NSAIDs while taking prednisone.  Patient to alternate ice and heat.  Suspicious that patient history of scoliosis could be causing strain on neck musculature that is resulting in patient pain and symptoms. Patient to follow-up with PCP or orthopedic specialist if symptoms do not improve or to go to the hospital if symptoms significantly worsen.Discussed strict return precautions. Patient verbalized understanding and is agreeable with plan.   04/24/2021: Reports feeling the same since visit on yesterday.    Patient Active Problem List   Diagnosis Date Noted   Cyst of skin 03/26/2021   Chronic right-sided low back pain with right-sided sciatica 11/24/2020   Anxiety state 11/24/2020   Psychophysiological insomnia 11/24/2020     Current Outpatient Medications on File Prior to Visit  Medication Sig Dispense Refill   hydrOXYzine (ATARAX/VISTARIL) 10 MG tablet  Take 1 tablet (10 mg total) by mouth at bedtime as needed. 30 tablet 1   predniSONE (DELTASONE) 20 MG tablet Take 2 tablets (40 mg total) by mouth daily for 5 days. 10 tablet 0   No current facility-administered medications on file prior to visit.    Allergies  Allergen Reactions   Vinegar [Acetic Acid] Other (See Comments)    Red Vinegar- flush    Social History   Socioeconomic History   Marital status: Single    Spouse name: Not on file   Number of children: Not on file   Years of education: Not on file   Highest education level: Not on file  Occupational History   Not on file  Tobacco Use   Smoking status: Never   Smokeless tobacco: Never  Vaping Use   Vaping Use: Every day   Substances: Nicotine, THC  Substance and Sexual Activity   Alcohol use: Yes    Comment: occasionally   Drug use: Yes    Types: Marijuana   Sexual activity: Not on file  Other Topics Concern   Not on file  Social History Narrative   Not on file   Social Determinants of Health   Financial Resource Strain: Not on file  Food Insecurity: Not on file  Transportation Needs: Not on file  Physical Activity: Not on file  Stress: Not on file  Social Connections: Not on file  Intimate Partner Violence: Not on file    Family History  Family history unknown: Yes    Past Surgical History:  Procedure Laterality Date   NO PAST SURGERIES      ROS: Review of Systems Negative except as stated above  PHYSICAL EXAM: BP 102/68 (BP Location: Left Arm, Patient Position: Sitting, Cuff Size: Normal)   Pulse 74   Temp 98 F (36.7 C)   Resp 17   Ht 5' 8.62" (1.743 m)   Wt 177 lb 12.8 oz (80.6 kg)   SpO2 96%   BMI 26.55 kg/m   Physical Exam HENT:     Head: Normocephalic and atraumatic.  Eyes:     Extraocular Movements: Extraocular movements intact.     Conjunctiva/sclera: Conjunctivae normal.     Pupils: Pupils are equal, round, and reactive to light.  Cardiovascular:     Rate and Rhythm:  Normal rate and regular rhythm.     Pulses: Normal pulses.     Heart sounds: Normal heart sounds.  Pulmonary:     Effort: Pulmonary effort is normal.     Breath sounds: Normal breath sounds.  Musculoskeletal:     Cervical back: Normal range of motion and neck supple.     Comments:  Pain present on palpation to left trapezius muscle directly to left of C7.  Patient also has pain to palpation directly over left scapula.  Normal sensation to the left upper extremity.Reflexes intact.    Neurological:     General: No focal deficit present.     Mental Status: He is alert and oriented to person, place, and time.  Psychiatric:        Mood and Affect: Mood normal.        Behavior: Behavior normal.    ASSESSMENT AND PLAN: 1. Neck pain: 2. Trapezius muscle strain, left, subsequent encounter: 3. History of scoliosis: - Diagnostic x-ray cervical spine on 04/23/2021 resulted no evidence of cervical spine fracture or prevertebral soft tissue swelling. Alignment is normal. No other significant bone abnormalities are identified. - Continue Prednisone as prescribed.  - Continue alternating heat and ice.  - Patient reports history of scoliosis in his teenage years.  - Referral to Orthopedic Surgery for further evaluation and management.  - Ambulatory referral to Orthopedic Surgery    Patient was given the opportunity to ask questions.  Patient verbalized understanding of the plan and was able to repeat key elements of the plan. Patient was given clear instructions to go to Emergency Department or return to medical center if symptoms don't improve, worsen, or new problems develop.The patient verbalized understanding.   Orders Placed This Encounter  Procedures   Ambulatory referral to Orthopedic Surgery    Follow-up with primary provider as scheduled.   Rema Fendt, NP

## 2021-04-23 NOTE — Telephone Encounter (Signed)
Pt called in stating he is still having left side neck pain and now it is going into his left shoulder. Pt states he is having trouble sleeping now. He states his left arm is tingling and becoming numb and he is starting to have issues in the back area. I informed pt he has appointment tomorrow 7.1.2022 and to also make sure he lets his PCP know this same information.

## 2021-04-23 NOTE — Discharge Instructions (Addendum)
Pain is most likely a muscular strain. Pain may take 1-2 weeks to resolve.  You have been prescribed prednisone which is a steroid to decrease pain and inflammation.  Please alternate using ice/heating pad to help with any discomfort.   Please follow up with primary care or orthopedist if symptoms not improving in 2 weeks. Please return sooner if pain changes or worsens.

## 2021-04-24 ENCOUNTER — Other Ambulatory Visit: Payer: Self-pay

## 2021-04-24 ENCOUNTER — Ambulatory Visit (INDEPENDENT_AMBULATORY_CARE_PROVIDER_SITE_OTHER): Payer: Self-pay | Admitting: Family

## 2021-04-24 VITALS — BP 102/68 | HR 74 | Temp 98.0°F | Resp 17 | Ht 68.62 in | Wt 177.8 lb

## 2021-04-24 DIAGNOSIS — Z8739 Personal history of other diseases of the musculoskeletal system and connective tissue: Secondary | ICD-10-CM

## 2021-04-24 DIAGNOSIS — S46812D Strain of other muscles, fascia and tendons at shoulder and upper arm level, left arm, subsequent encounter: Secondary | ICD-10-CM

## 2021-04-24 DIAGNOSIS — M542 Cervicalgia: Secondary | ICD-10-CM

## 2021-04-24 NOTE — Progress Notes (Signed)
Pt presents for back pain f/u. Pt went to UC on 6/29 for trapezius muscle strain states he's a little sore today.pt started prednisone today

## 2021-04-28 ENCOUNTER — Other Ambulatory Visit: Payer: Self-pay

## 2021-04-28 ENCOUNTER — Ambulatory Visit (INDEPENDENT_AMBULATORY_CARE_PROVIDER_SITE_OTHER): Payer: No Payment, Other | Admitting: Licensed Clinical Social Worker

## 2021-04-28 DIAGNOSIS — F6381 Intermittent explosive disorder: Secondary | ICD-10-CM | POA: Insufficient documentation

## 2021-04-28 DIAGNOSIS — F331 Major depressive disorder, recurrent, moderate: Secondary | ICD-10-CM | POA: Diagnosis not present
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
## 2021-04-30 ENCOUNTER — Other Ambulatory Visit: Payer: Self-pay

## 2021-04-30 ENCOUNTER — Encounter: Payer: Self-pay | Admitting: Family Medicine

## 2021-04-30 ENCOUNTER — Ambulatory Visit (INDEPENDENT_AMBULATORY_CARE_PROVIDER_SITE_OTHER): Payer: Self-pay | Admitting: Family Medicine

## 2021-04-30 DIAGNOSIS — M542 Cervicalgia: Secondary | ICD-10-CM

## 2021-04-30 MED ORDER — BACLOFEN 10 MG PO TABS: 5.0000 mg | ORAL_TABLET | Freq: Three times a day (TID) | ORAL | 3 refills | Status: DC | PRN

## 2021-04-30 MED ORDER — DICLOFENAC SODIUM 75 MG PO TBEC: 75.0000 mg | DELAYED_RELEASE_TABLET | Freq: Two times a day (BID) | ORAL | 3 refills | Status: DC | PRN

## 2021-04-30 NOTE — Progress Notes (Signed)
Office Visit Note   Patient: Randy Braun           Date of Birth: 1986/09/13           MRN: [id number] Visit Date: 04/30/2021 Requested by: Rema Fendt, NP [address] [place],  [place] [address] PCP: Rema Fendt, NP  Subjective: Chief Complaint  Patient presents with   Neck - Pain    Pain in the left side of the neck and down into the muscles around the left scapula since early June. NKI. He said he normally suffers from "a lot of stiff necks." Started having tingling in the arm down to the hand about 2 weeks ago. Went to UC, had xrays and was given prednisone - finished this. Has meloxicam that he takes for his knees-- only worked well once for him. Also has ibuprofen 800 mg - meloxicam works a bit better than the ibuprofen for this pain.    HPI: He is here with neck and left arm pain.  Symptoms originally started about a year and a half ago with intermittent tightness in his neck after sleeping.  Last month he woke up 1 day with pain and a shooting sensation into his left arm.  He went to an urgent care and had x-rays obtained which were negative for acute abnormality.  He was given prednisone and that has helped a little bit.  1 day he turned his neck in a certain position and it seemed to pop a little bit, which helped his neck rotate better but he has continued to have a numbness sensation in his left arm.  He is right-hand dominant.                ROS:   All other systems were reviewed and are negative.  Objective: Vital Signs: There were no vitals taken for this visit.  Physical Exam:  General:  Alert and oriented, in no acute distress. Pulm:  Breathing unlabored. Psy:  Normal mood, congruent affect  Neck: He has pain at the extremes of rotation to the left and upward gaze.  He is tender to palpation in the left lower cervical paraspinous muscles and into the rhomboid area.  Full range of motion of the shoulder with 5/5 upper extremity strength and 2+  DTRs.  Imaging: No results found.  Assessment & Plan: Neck and left arm pain, concerning for cervical disc protrusion. -We will try physical therapy.  If he fails to improve, then MRI of the cervical spine. -Baclofen and diclofenac as needed.     Procedures: No procedures performed        PMFS History: Patient Active Problem List   Diagnosis Date Noted   Intermittent explosive disorder 04/28/2021   Major depressive disorder, recurrent episode, moderate (HCC) 04/28/2021   Cyst of skin 03/26/2021   Chronic right-sided low back pain with right-sided sciatica 11/24/2020   Anxiety state 11/24/2020   Psychophysiological insomnia 11/24/2020   Past Medical History:  Diagnosis Date   Anxiety    Depression     Family History  Family history unknown: Yes    Past Surgical History:  Procedure Laterality Date   NO PAST SURGERIES     Social History   Occupational History   Not on file  Tobacco Use   Smoking status: Never   Smokeless tobacco: Never  Vaping Use   Vaping Use: Every day   Substances: Nicotine, THC  Substance and Sexual Activity   Alcohol use: Yes  Comment: occasionally   Drug use: Yes    Types: Marijuana   Sexual activity: Not on file

## 2021-05-05 ENCOUNTER — Ambulatory Visit (INDEPENDENT_AMBULATORY_CARE_PROVIDER_SITE_OTHER): Payer: Self-pay | Admitting: Physical Therapy

## 2021-05-05 ENCOUNTER — Encounter (HOSPITAL_COMMUNITY): Payer: Self-pay | Admitting: Physician Assistant

## 2021-05-05 ENCOUNTER — Encounter: Payer: Self-pay | Admitting: Physical Therapy

## 2021-05-05 ENCOUNTER — Other Ambulatory Visit: Payer: Self-pay

## 2021-05-05 ENCOUNTER — Ambulatory Visit (INDEPENDENT_AMBULATORY_CARE_PROVIDER_SITE_OTHER): Payer: No Payment, Other | Admitting: Physician Assistant

## 2021-05-05 VITALS — BP 115/77 | HR 74 | Ht 70.0 in | Wt 181.0 lb

## 2021-05-05 DIAGNOSIS — F411 Generalized anxiety disorder: Secondary | ICD-10-CM

## 2021-05-05 DIAGNOSIS — F3161 Bipolar disorder, current episode mixed, mild: Secondary | ICD-10-CM | POA: Insufficient documentation

## 2021-05-05 DIAGNOSIS — F99 Mental disorder, not otherwise specified: Secondary | ICD-10-CM | POA: Diagnosis not present

## 2021-05-05 DIAGNOSIS — R293 Abnormal posture: Secondary | ICD-10-CM

## 2021-05-05 DIAGNOSIS — R29898 Other symptoms and signs involving the musculoskeletal system: Secondary | ICD-10-CM

## 2021-05-05 DIAGNOSIS — M6281 Muscle weakness (generalized): Secondary | ICD-10-CM

## 2021-05-05 DIAGNOSIS — M5412 Radiculopathy, cervical region: Secondary | ICD-10-CM

## 2021-05-05 DIAGNOSIS — F5105 Insomnia due to other mental disorder: Secondary | ICD-10-CM | POA: Diagnosis not present

## 2021-05-05 MED ORDER — TRAZODONE HCL 50 MG PO TABS: 50.0000 mg | ORAL_TABLET | Freq: Every day | ORAL | 1 refills | Status: DC

## 2021-05-05 MED ORDER — ARIPIPRAZOLE 5 MG PO TABS: 5.0000 mg | ORAL_TABLET | Freq: Every day | ORAL | 1 refills | Status: DC

## 2021-05-05 NOTE — Patient Instructions (Signed)
Access Code: [facility] URL: [email or web address] Date: 05/05/2021 Prepared by: Moshe Cipro  Exercises Supine Cervical Retraction with Towel - 3-5 x daily - 7 x weekly - 1 sets - 10 reps - 10 sec hold Seated Cervical Retraction - 3-5 x daily - 7 x weekly - 1 sets - 10 reps - 10 sec hold Standing Backward Shoulder Rolls - 2 x daily - 7 x weekly - 10 reps - 1 sets Seated Scapular Retraction - 2 x daily - 7 x weekly - 10 reps - 1 sets - 5 sec hold
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
## 2021-05-05 NOTE — Therapy (Addendum)
[facility] [address] [place], [place], [address] Phone: [phone]   Fax:  [phone]  Physical Therapy Evaluation  Patient Details  Name: Randy Braun MRN: [id number] Date of Birth: Sep 08, 1986 Referring Provider (PT): Lavada Mesi, MD   Encounter Date: 05/05/2021    05/05/21 0913  PT Visits / Re-Eval  Visit Number 1  Number of Visits 12  Date for PT Re-Evaluation 06/16/21  Authorization  Authorization Type CAFA exp 06/04/21  PT Time Calculation  PT Start Time 0845  PT Stop Time 0925  PT Time Calculation (min) 40 min  PT - End of Session  Activity Tolerance Patient tolerated treatment well  Behavior During Therapy [facility] for tasks assessed/performed   Past Medical History:  Diagnosis Date   Anxiety    Depression     Past Surgical History:  Procedure Laterality Date   NO PAST SURGERIES      There were no vitals filed for this visit.    Subjective Assessment - 05/05/21 0848     Subjective Pt is a 35 y/o male who presents to OPPT for Lt sided neck pain since early June.  He reports feeling stiffness in his neck after reading on his tablet, and then "stretched something wrong."  After about 1 month he noticed is Lt arm numbness/tingling all the way to his ring/pinky fingers.  He went to UC at that time, and medications have helped to decrease the pain, but he's still having numbness and tingling.    Limitations Lifting    Patient Stated Goals improve symptoms    Currently in Pain? Yes    Pain Score 0-No pain   up to 8/10   Pain Location Scapula   neck/scapula/shoulder   Pain Orientation Left    Pain Descriptors / Indicators Sore;Tightness;Spasm;Numbness;Tingling    Pain Type Acute pain    Pain Onset More than a month ago    Pain Frequency Constant    Aggravating Factors  prolonged sitting    Pain Relieving Factors medication    Effect of Pain on Daily Activities no problems at work - standing                 [facility] PT Assessment - 05/05/21 0853       Assessment   Medical Diagnosis M54.2 (ICD-10-CM) - Neck pain    Referring Provider (PT) Hilts, Michael, MD    Onset Date/Surgical Date --   early June   Hand Dominance Right    Next MD Visit PRN    Prior Therapy for scoliosis - in middle school      Precautions   Precautions None      Restrictions   Weight Bearing Restrictions No      Balance Screen   Has the patient fallen in the past 6 months No    Has the patient had a decrease in activity level because of a fear of falling?  No    Is the patient reluctant to leave their home because of a fear of falling?  No      Home Environment   Living Environment Private residence    Living Arrangements Alone    Type of Home Apartment    Additional Comments denies difficulty with ADLs      Prior Function   Level of Independence Independent    Vocation Full time employment    Vocation Requirements works at a [employer] - standing all day, some lifting (TVs, other items)    Leisure  occasional video games; no regular exercise - before pain was doing push ups/crunches      Cognition   Overall Cognitive Status Within Functional Limits for tasks assessed      Observation/Other Assessments   Focus on Therapeutic Outcomes (FOTO)  63 (predicted 74)      Posture/Postural Control   Posture/Postural Control Postural limitations    Postural Limitations Forward head;Rounded Shoulders      ROM / Strength   AROM / PROM / Strength AROM;Strength      AROM   AROM Assessment Site Cervical    Cervical Flexion 55   with pulling pain   Cervical Extension 40   with pain Lt side   Cervical - Right Side Bend 30    Cervical - Left Side Bend 36   with pain   Cervical - Right Rotation 75    Cervical - Left Rotation 72   with pain     Strength   Strength Assessment Site Shoulder;Hand    Right/Left Shoulder Right;Left    Right Shoulder Flexion 5/5    Right Shoulder ABduction 5/5    Right Shoulder Internal  Rotation 5/5    Right Shoulder External Rotation 5/5    Left Shoulder Flexion 4/5    Left Shoulder ABduction 4/5    Left Shoulder Internal Rotation 5/5    Left Shoulder External Rotation 5/5    Right/Left hand --   grip 5/5 on Rt, 4/5 on Lt     Palpation   Palpation comment trigger points noted in Lt upper trap, levator scapula, rhomboids and infraspinatus      Special Tests    Special Tests Cervical    Cervical Tests Spurling's;Dictraction      Spurling's   Findings Positive    Side Left      Distraction Test   Findngs Positive    side Left    Comment slight reduction in symptoms                        Objective measurements completed on examination: See above findings.       [facility] Adult PT Treatment/Exercise - 05/05/21 0853       Exercises   Exercises Other Exercises    Other Exercises  see pt instructions - verbally reviewed and pt performed 1-5 reps of each exercise      Modalities   Modalities Traction      Traction   Type of Traction Cervical    Min (lbs) 15    Max (lbs) 20    Hold Time 60    Rest Time 20    Time 12                    PT Education - 05/05/21 0912     Education Details HEP    Person(s) Educated Patient    Methods Explanation;Demonstration;Handout    Comprehension Verbalized understanding;Returned demonstration;Need further instruction              PT Short Term Goals - 05/05/21 0943       PT SHORT TERM GOAL #1   Title Independent with initial HEP    Time 3    Period Weeks    Status New    Target Date 05/26/21               PT Long Term Goals - 05/05/21 0943       PT LONG TERM GOAL #1  Title Independent with final HEP    Time 6    Period Weeks    Status New    Target Date 06/16/21      PT LONG TERM GOAL #2   Title FOTO score improved to 74 for improved function    Time 6    Period Weeks    Status New    Target Date 06/16/21      PT LONG TERM GOAL #3   Title Perform cervical  AROM without pain for improved mobility    Time 6    Period Weeks    Status New    Target Date 06/16/21      PT LONG TERM GOAL #4   Title Report centralization of symptoms for improved function    Time 6    Period Weeks    Status New    Target Date 06/16/21      PT LONG TERM GOAL #5   Title Demonstrate at least 5/5 strength in LUE for improved function    Time 6    Period Weeks    Status New    Target Date 06/16/21                    Plan - 05/05/21 0917     Clinical Impression Statement Pt is a 35 y/o male who presents to OPPT for acute onset of Lt sided neck pain x ~ 6 weeks without known injury.  Pt demonstrates increased pain and symptoms consistent with cervical radiculopathy as noted with positive Spurling's test.  He also has trigger points in upper trap/levator scapula/rhomboids and infraspinatus so this may be impacting symptoms as well. He had reduction in pain with traction today and will benefit from PT to address deficits listed.    Personal Factors and Comorbidities Comorbidity 2;Transportation;Past/Current Experience;Finances    Comorbidities anxiety, depression    Examination-Activity Limitations Sit;Sleep;Lift;Reach Overhead    Examination-Participation Restrictions Community Activity;Occupation    Stability/Clinical Decision Making Evolving/Moderate complexity    Clinical Decision Making Moderate    Rehab Potential Good    PT Frequency 2x / week   1-2x/wk   PT Duration 6 weeks    PT Treatment/Interventions ADLs/Self Care Home Management;Cryotherapy;Electrical Stimulation;Moist Heat;Traction;Therapeutic exercise;Therapeutic activities;Functional mobility training;Neuromuscular re-education;Patient/family education;Manual techniques;Taping;Dry needling;Passive range of motion    PT Next Visit Plan review HEP, assess response to traction and continue PRN, may need DN, as symptoms centralize shift to strengthening program    PT Home Exercise Plan Access  Code: [email or web address]    Consulted and Agree with Plan of Care Patient             Patient will benefit from skilled therapeutic intervention in order to improve the following deficits and impairments:  Impaired tone, Increased fascial restricitons, Pain, Decreased strength, Impaired flexibility, Postural dysfunction  Visit Diagnosis: Radiculopathy, cervical region - Plan: PT plan of care cert/re-cert  Abnormal posture - Plan: PT plan of care cert/re-cert  Muscle weakness (generalized) - Plan: PT plan of care cert/re-cert  Other symptoms and signs involving the musculoskeletal system - Plan: PT plan of care cert/re-cert     Problem List Patient Active Problem List   Diagnosis Date Noted   Intermittent explosive disorder 04/28/2021   Major depressive disorder, recurrent episode, moderate (HCC) 04/28/2021   Cyst of skin 03/26/2021   Chronic right-sided low back pain with right-sided sciatica 11/24/2020   Anxiety state 11/24/2020   Psychophysiological insomnia 11/24/2020     Clarita Crane, PT,  DPT 05/05/21 9:47 AM      [facility] [address] [place], [place], [address] Phone: [phone]   Fax:  [phone]  Name: Randy Braun MRN: [id number] Date of Birth: 01/09/1986

## 2021-05-13 ENCOUNTER — Encounter: Payer: Self-pay | Admitting: Rehabilitative and Restorative Service Providers"

## 2021-05-13 ENCOUNTER — Ambulatory Visit (INDEPENDENT_AMBULATORY_CARE_PROVIDER_SITE_OTHER): Payer: Self-pay | Admitting: Rehabilitative and Restorative Service Providers"

## 2021-05-13 ENCOUNTER — Other Ambulatory Visit: Payer: Self-pay

## 2021-05-13 DIAGNOSIS — R293 Abnormal posture: Secondary | ICD-10-CM

## 2021-05-13 DIAGNOSIS — M6281 Muscle weakness (generalized): Secondary | ICD-10-CM

## 2021-05-13 DIAGNOSIS — R29898 Other symptoms and signs involving the musculoskeletal system: Secondary | ICD-10-CM

## 2021-05-13 DIAGNOSIS — M5412 Radiculopathy, cervical region: Secondary | ICD-10-CM

## 2021-05-13 NOTE — Therapy (Signed)
[facility] [address] [place], [place], [address] Phone: [phone]   Fax:  [phone]  Physical Therapy Treatment  Patient Details  Name: Randy Braun MRN: [id number] Date of Birth: 1985-11-23 Referring Provider (PT): Lavada Mesi, MD   Encounter Date: 05/13/2021   PT End of Session - 05/13/21 1200     Visit Number 2    Number of Visits 12    Date for PT Re-Evaluation 06/16/21    Authorization Type CAFA exp 06/04/21    PT Start Time 1145    PT Stop Time 1225    PT Time Calculation (min) 40 min    Activity Tolerance Patient tolerated treatment well    Behavior During Therapy [facility] for tasks assessed/performed             Past Medical History:  Diagnosis Date   Anxiety    Depression     Past Surgical History:  Procedure Laterality Date   NO PAST SURGERIES      There were no vitals filed for this visit.   Subjective Assessment - 05/13/21 1141     Subjective Pt. stated with day to day activity he has noted somewhat of a decrease with some flexiblity gains.  Pt. stated still limited some lifting at work.  Pt. stated he feels like leaning head to Lt makes Lt arm more noted.   Pt. indicated retraction exercise gives some into Lt neck/shoulder area for several minutes afterward (described as aching).    Limitations Lifting    Patient Stated Goals improve symptoms    Pain Location Other (Comment)   neck, Lt shoulder, arm   Pain Orientation Left    Pain Descriptors / Indicators Aching;Tightness;Sore    Pain Onset More than a month ago    Pain Frequency Intermittent    Aggravating Factors  leaning neck to Lt    Pain Relieving Factors some exercise, medicine, traction helped                               [facility] Adult PT Treatment/Exercise - 05/13/21 0001       Exercises   Exercises Other Exercises;Neck    Other Exercises  Reviewed HEP, discussion about DN and topical cream use for symptom relief.       Neck Exercises: Seated   Other Seated Exercise scapular retraction 2 sec hold x 10, shoulder rolls posteriorly x 10      Traction   Min (lbs) 15    Max (lbs) 20    Hold Time 60    Rest Time 20    Time 10      Manual Therapy   Manual therapy comments compression Lt upper trap, skilled palpation during DN      Neck Exercises: Stretches   Upper Trapezius Stretch 5 reps;Left   15 seconds x 5             Trigger Point Dry Needling - 05/13/21 0001     Consent Given? Yes    Education Handout Provided Yes    Muscles Treated Head and Neck Upper trapezius   Lt   Upper Trapezius Response Twitch reponse elicited                  PT Education - 05/13/21 1200     Education Details HEP update, DN    Person(s) Educated Patient    Methods Explanation;Demonstration;Verbal cues;Handout    Comprehension Returned  demonstration;Verbalized understanding              PT Short Term Goals - 05/05/21 0943       PT SHORT TERM GOAL #1   Title Independent with initial HEP    Time 3    Period Weeks    Status New    Target Date 05/26/21               PT Long Term Goals - 05/05/21 0943       PT LONG TERM GOAL #1   Title Independent with final HEP    Time 6    Period Weeks    Status New    Target Date 06/16/21      PT LONG TERM GOAL #2   Title FOTO score improved to 74 for improved function    Time 6    Period Weeks    Status New    Target Date 06/16/21      PT LONG TERM GOAL #3   Title Perform cervical AROM without pain for improved mobility    Time 6    Period Weeks    Status New    Target Date 06/16/21      PT LONG TERM GOAL #4   Title Report centralization of symptoms for improved function    Time 6    Period Weeks    Status New    Target Date 06/16/21      PT LONG TERM GOAL #5   Title Demonstrate at least 5/5 strength in LUE for improved function    Time 6    Period Weeks    Status New    Target Date 06/16/21                    Plan - 05/13/21 1208     Clinical Impression Statement Positive indications from cervical traction use for clinical reduction of symptoms.  In addition, inclusion of trigger point treatment to Lt upper trap region produced concordant symptoms in area and was appropriate for addressing complaints from Lt cervical/superior shoulder region.    Personal Factors and Comorbidities Comorbidity 2;Transportation;Past/Current Experience;Finances    Comorbidities anxiety, depression    Examination-Activity Limitations Sit;Sleep;Lift;Reach Overhead    Examination-Participation Restrictions Community Activity;Occupation    Stability/Clinical Decision Making Evolving/Moderate complexity    Rehab Potential Good    PT Frequency 2x / week   1-2x/wk   PT Duration 6 weeks    PT Treatment/Interventions ADLs/Self Care Home Management;Cryotherapy;Electrical Stimulation;Moist Heat;Traction;Therapeutic exercise;Therapeutic activities;Functional mobility training;Neuromuscular re-education;Patient/family education;Manual techniques;Taping;Dry needling;Passive range of motion    PT Next Visit Plan Traction use prn, follow up on myofascial release    PT Home Exercise Plan Access Code: [email or web address]    Consulted and Agree with Plan of Care Patient             Patient will benefit from skilled therapeutic intervention in order to improve the following deficits and impairments:  Impaired tone, Increased fascial restricitons, Pain, Decreased strength, Impaired flexibility, Postural dysfunction  Visit Diagnosis: Radiculopathy, cervical region  Abnormal posture  Muscle weakness (generalized)  Other symptoms and signs involving the musculoskeletal system     Problem List Patient Active Problem List   Diagnosis Date Noted   Bipolar disorder, current episode mixed, mild (HCC) 05/05/2021   Insomnia due to other mental disorder 05/05/2021   Intermittent explosive disorder 04/28/2021   Major depressive disorder,  recurrent episode, moderate (HCC) 04/28/2021   Cyst of skin 03/26/2021  Chronic right-sided low back pain with right-sided sciatica 11/24/2020   Generalized anxiety disorder 11/24/2020   Psychophysiological insomnia 11/24/2020   Chyrel Masson, PT, DPT, OCS, ATC 05/13/21  12:10 PM    [facility] [address] [place], [place], [address] Phone: [phone]   Fax:  [phone]  Name: Randy Braun MRN: [id number] Date of Birth: [birth year]

## 2021-05-13 NOTE — Patient Instructions (Signed)
Access Code: [facility] URL: [email or web address] Date: 05/13/2021 Prepared by: Chyrel Masson  Exercises Supine Cervical Retraction with Towel - 3-5 x daily - 7 x weekly - 1 sets - 10 reps - 10 sec hold Seated Cervical Retraction - 3-5 x daily - 7 x weekly - 1 sets - 10 reps - 10 sec hold Standing Backward Shoulder Rolls - 2 x daily - 7 x weekly - 10 reps - 1 sets Seated Scapular Retraction - 2 x daily - 7 x weekly - 10 reps - 1 sets - 5 sec hold Seated Upper Trapezius Stretch (Mirrored) - 2 x daily - 7 x weekly - 1 sets - 5 reps - 15 hold  Patient Education Trigger Point Dry Needling

## 2021-05-15 ENCOUNTER — Ambulatory Visit (INDEPENDENT_AMBULATORY_CARE_PROVIDER_SITE_OTHER): Payer: Self-pay | Admitting: Rehabilitative and Restorative Service Providers"

## 2021-05-15 ENCOUNTER — Encounter: Payer: Self-pay | Admitting: Rehabilitative and Restorative Service Providers"

## 2021-05-15 ENCOUNTER — Other Ambulatory Visit: Payer: Self-pay

## 2021-05-15 ENCOUNTER — Telehealth: Payer: Self-pay | Admitting: Family Medicine

## 2021-05-15 DIAGNOSIS — M6281 Muscle weakness (generalized): Secondary | ICD-10-CM

## 2021-05-15 DIAGNOSIS — R293 Abnormal posture: Secondary | ICD-10-CM

## 2021-05-15 DIAGNOSIS — R29898 Other symptoms and signs involving the musculoskeletal system: Secondary | ICD-10-CM

## 2021-05-15 DIAGNOSIS — M5412 Radiculopathy, cervical region: Secondary | ICD-10-CM

## 2021-05-15 NOTE — Telephone Encounter (Signed)
Patient would like a refill on baclofen. His number is [phone]. He was here for PT today.

## 2021-05-15 NOTE — Therapy (Signed)
[facility] [address] [place], [place], [address] Phone: [phone]   Fax:  [phone]  Physical Therapy Treatment  Patient Details  Name: Randy Braun MRN: [id number] Date of Birth: [birth year] Referring Provider (PT): Lavada Mesi, MD   Encounter Date: 05/15/2021   PT End of Session - 05/15/21 1102     Visit Number 3    Number of Visits 12    Date for PT Re-Evaluation 06/16/21    Authorization Type CAFA exp 06/04/21    PT Start Time 1053    Activity Tolerance Patient tolerated treatment well    Behavior During Therapy [facility] for tasks assessed/performed             Past Medical History:  Diagnosis Date   Anxiety    Depression     Past Surgical History:  Procedure Laterality Date   NO PAST SURGERIES      There were no vitals filed for this visit.   Subjective Assessment - 05/15/21 1059     Subjective Pt. indicated feeling looser in Lt neck/shoulder and some reduction of arm symptoms as well.  Pt. indicated feeling some spasms on back of Lt shoulder and mid back reported no pain upon arrival today.    Limitations Lifting    Patient Stated Goals improve symptoms    Currently in Pain? No/denies    Pain Score 0-No pain    Pain Onset More than a month ago                               [facility] Adult PT Treatment/Exercise - 05/15/21 0001       Neck Exercises: Machines for Strengthening   UBE (Upper Arm Bike) 3 mins fwd/back each way lvl 3.5      Neck Exercises: Seated   Other Seated Exercise scapular retraction 2 sec hold x 10, shoulder rolls posteriorly x 10      Traction   Min (lbs) 15    Max (lbs) 20    Hold Time 60    Rest Time 20    Time 15      Manual Therapy   Manual therapy comments compression Lt upper trap, skilled palpation during DN      Neck Exercises: Stretches   Upper Trapezius Stretch 5 reps;Left   15 sec             Trigger Point Dry Needling - 05/15/21 0001      Consent Given? Yes    Education Handout Provided Previously provided    Muscles Treated Head and Neck Upper trapezius   Lt   Muscles Treated Upper Quadrant Rhomboids   Lt   Upper Trapezius Response Twitch reponse elicited    Rhomboids Response Twitch response elicited                    PT Short Term Goals - 05/15/21 1122       PT SHORT TERM GOAL #1   Title Independent with initial HEP    Time 3    Period Weeks    Status On-going    Target Date 05/26/21               PT Long Term Goals - 05/05/21 0943       PT LONG TERM GOAL #1   Title Independent with final HEP    Time 6    Period Weeks    Status  New    Target Date 06/16/21      PT LONG TERM GOAL #2   Title FOTO score improved to 74 for improved function    Time 6    Period Weeks    Status New    Target Date 06/16/21      PT LONG TERM GOAL #3   Title Perform cervical AROM without pain for improved mobility    Time 6    Period Weeks    Status New    Target Date 06/16/21      PT LONG TERM GOAL #4   Title Report centralization of symptoms for improved function    Time 6    Period Weeks    Status New    Target Date 06/16/21      PT LONG TERM GOAL #5   Title Demonstrate at least 5/5 strength in LUE for improved function    Time 6    Period Weeks    Status New    Target Date 06/16/21                   Plan - 05/15/21 1121     Clinical Impression Statement Lt cervical/upper trap symptoms showed progress from last treatment visit per Pt. report.  Treated medial scapular rhomboid attachement today to address pin point trigger point location in that area.  Continued traction use indicated as symptoms in Lt arm have responsed from use.  Continued skilled PT services indicated at this time.    Personal Factors and Comorbidities Comorbidity 2;Transportation;Past/Current Experience;Finances    Comorbidities anxiety, depression    Examination-Activity Limitations Sit;Sleep;Lift;Reach  Overhead    Examination-Participation Restrictions Community Activity;Occupation    Stability/Clinical Decision Making Evolving/Moderate complexity    Rehab Potential Good    PT Frequency 2x / week   1-2x/wk   PT Duration 6 weeks    PT Treatment/Interventions ADLs/Self Care Home Management;Cryotherapy;Electrical Stimulation;Moist Heat;Traction;Therapeutic exercise;Therapeutic activities;Functional mobility training;Neuromuscular re-education;Patient/family education;Manual techniques;Taping;Dry needling;Passive range of motion    PT Next Visit Plan Traction use prn, dry needling as desired    PT Home Exercise Plan Access Code: [email or web address]    Consulted and Agree with Plan of Care Patient             Patient will benefit from skilled therapeutic intervention in order to improve the following deficits and impairments:  Impaired tone, Increased fascial restricitons, Pain, Decreased strength, Impaired flexibility, Postural dysfunction  Visit Diagnosis: Radiculopathy, cervical region  Abnormal posture  Muscle weakness (generalized)  Other symptoms and signs involving the musculoskeletal system     Problem List Patient Active Problem List   Diagnosis Date Noted   Bipolar disorder, current episode mixed, mild (HCC) 05/05/2021   Insomnia due to other mental disorder 05/05/2021   Intermittent explosive disorder 04/28/2021   Major depressive disorder, recurrent episode, moderate (HCC) 04/28/2021   Cyst of skin 03/26/2021   Chronic right-sided low back pain with right-sided sciatica 11/24/2020   Generalized anxiety disorder 11/24/2020   Psychophysiological insomnia 11/24/2020   Chyrel Masson, PT, DPT, OCS, ATC 05/15/21  11:28 AM   [facility] [address] [place], [place], [address] Phone: [phone]   Fax:  [phone]  Name: Wilber Fini MRN: [id number] Date of Birth: [birth year]

## 2021-05-15 NOTE — Telephone Encounter (Signed)
I looked the medication on his list--looks like he has 3 refills on it--I called and advised patient that he has 3 refills on it. He will call his pharmacy and have them to refill it.

## 2021-05-19 ENCOUNTER — Encounter: Payer: Self-pay | Admitting: Physical Therapy

## 2021-05-21 ENCOUNTER — Other Ambulatory Visit: Payer: Self-pay

## 2021-05-21 ENCOUNTER — Ambulatory Visit (INDEPENDENT_AMBULATORY_CARE_PROVIDER_SITE_OTHER): Payer: Self-pay | Admitting: Physical Therapy

## 2021-05-21 ENCOUNTER — Encounter: Payer: Self-pay | Admitting: Physical Therapy

## 2021-05-21 DIAGNOSIS — M5412 Radiculopathy, cervical region: Secondary | ICD-10-CM

## 2021-05-21 DIAGNOSIS — R293 Abnormal posture: Secondary | ICD-10-CM

## 2021-05-21 DIAGNOSIS — R29898 Other symptoms and signs involving the musculoskeletal system: Secondary | ICD-10-CM

## 2021-05-21 DIAGNOSIS — M6281 Muscle weakness (generalized): Secondary | ICD-10-CM

## 2021-05-21 NOTE — Therapy (Signed)
[facility] [address] [place], [place], [address] Phone: [phone]   Fax:  [phone]  Physical Therapy Treatment  Patient Details  Name: Randy Braun MRN: [id number] Date of Birth: 24-Oct-1986 Referring Provider (PT): Lavada Mesi, MD   Encounter Date: 05/21/2021   PT End of Session - 05/21/21 0957     Visit Number 4    Number of Visits 12    Date for PT Re-Evaluation 06/16/21    Authorization Type CAFA exp 06/04/21    PT Start Time 0930    PT Stop Time 1005    PT Time Calculation (min) 35 min    Activity Tolerance Patient tolerated treatment well    Behavior During Therapy [facility] for tasks assessed/performed             Past Medical History:  Diagnosis Date   Anxiety    Depression     Past Surgical History:  Procedure Laterality Date   NO PAST SURGERIES      There were no vitals filed for this visit.   Subjective Assessment - 05/21/21 0932     Subjective fell on Sunday - being chased by a wasp/bee and tripped and fell on back felt a pop in the neck.  since then is experiencing numbness in the thumb and episodes of sharp pain from scapula into Lt upper arm. Also reports trying to do a push up and feels like Lt side "isn't working"    Limitations Lifting    Patient Stated Goals improve symptoms    Currently in Pain? No/denies                [facility] PT Assessment - 05/21/21 0939       Assessment   Medical Diagnosis M54.2 (ICD-10-CM) - Neck pain    Referring Provider (PT) Hilts, Michael, MD      Sensation   Additional Comments c/o numbness into Lt thumb      Strength   Overall Strength Comments difficulty with elbow extension 4/5 on Lt; increased difficulty with tricep push up on Lt side noted      Spurling's   Findings Positive    Side Left                           [facility] Adult PT Treatment/Exercise - 05/21/21 0945       Traction   Min (lbs) 15    Max (lbs) 20    Hold Time 60     Rest Time 20    Time 15                      PT Short Term Goals - 05/21/21 0957       PT SHORT TERM GOAL #1   Title Independent with initial HEP    Time 3    Period Weeks    Status On-going    Target Date 05/26/21               PT Long Term Goals - 05/05/21 0943       PT LONG TERM GOAL #1   Title Independent with final HEP    Time 6    Period Weeks    Status New    Target Date 06/16/21      PT LONG TERM GOAL #2   Title FOTO score improved to 74 for improved function    Time 6    Period Weeks  Status New    Target Date 06/16/21      PT LONG TERM GOAL #3   Title Perform cervical AROM without pain for improved mobility    Time 6    Period Weeks    Status New    Target Date 06/16/21      PT LONG TERM GOAL #4   Title Report centralization of symptoms for improved function    Time 6    Period Weeks    Status New    Target Date 06/16/21      PT LONG TERM GOAL #5   Title Demonstrate at least 5/5 strength in LUE for improved function    Time 6    Period Weeks    Status New    Target Date 06/16/21                   Plan - 05/21/21 0957     Clinical Impression Statement Pt arrived today with new c/o of recent fall onto back and experiencing a pop in neck at that time.  Initially felt it increased his ROM, and then noticed new onset of Lt thumb numbness and mild tricep weakness.  Traction continued today to see if symptoms improve.  If no improvement recommend follow up with MD to discuss symptoms.    Personal Factors and Comorbidities Comorbidity 2;Transportation;Past/Current Experience;Finances    Comorbidities anxiety, depression    Examination-Activity Limitations Sit;Sleep;Lift;Reach Overhead    Examination-Participation Restrictions Community Activity;Occupation    Stability/Clinical Decision Making Evolving/Moderate complexity    Rehab Potential Good    PT Frequency 2x / week   1-2x/wk   PT Duration 6 weeks    PT  Treatment/Interventions ADLs/Self Care Home Management;Cryotherapy;Electrical Stimulation;Moist Heat;Traction;Therapeutic exercise;Therapeutic activities;Functional mobility training;Neuromuscular re-education;Patient/family education;Manual techniques;Taping;Dry needling;Passive range of motion    PT Next Visit Plan see if symptoms improved, continue with traction and DN PRN    PT Home Exercise Plan Access Code: [email or web address]    Consulted and Agree with Plan of Care Patient             Patient will benefit from skilled therapeutic intervention in order to improve the following deficits and impairments:  Impaired tone, Increased fascial restricitons, Pain, Decreased strength, Impaired flexibility, Postural dysfunction  Visit Diagnosis: Radiculopathy, cervical region  Abnormal posture  Muscle weakness (generalized)  Other symptoms and signs involving the musculoskeletal system     Problem List Patient Active Problem List   Diagnosis Date Noted   Bipolar disorder, current episode mixed, mild (HCC) 05/05/2021   Insomnia due to other mental disorder 05/05/2021   Intermittent explosive disorder 04/28/2021   Major depressive disorder, recurrent episode, moderate (HCC) 04/28/2021   Cyst of skin 03/26/2021   Chronic right-sided low back pain with right-sided sciatica 11/24/2020   Generalized anxiety disorder 11/24/2020   Psychophysiological insomnia 11/24/2020      Clarita Crane, PT, DPT 05/21/21 10:07 AM     [facility] [address] [place], [place], [address] Phone: [phone]   Fax:  [phone]  Name: Randy Braun MRN: [id number] Date of Birth: 15-Dec-1985

## 2021-05-26 ENCOUNTER — Other Ambulatory Visit: Payer: Self-pay

## 2021-05-26 ENCOUNTER — Ambulatory Visit (INDEPENDENT_AMBULATORY_CARE_PROVIDER_SITE_OTHER): Payer: Self-pay | Admitting: Physical Therapy

## 2021-05-26 ENCOUNTER — Encounter: Payer: Self-pay | Admitting: Physical Therapy

## 2021-05-26 DIAGNOSIS — M6281 Muscle weakness (generalized): Secondary | ICD-10-CM

## 2021-05-26 DIAGNOSIS — R29898 Other symptoms and signs involving the musculoskeletal system: Secondary | ICD-10-CM

## 2021-05-26 DIAGNOSIS — M5412 Radiculopathy, cervical region: Secondary | ICD-10-CM

## 2021-05-26 DIAGNOSIS — R293 Abnormal posture: Secondary | ICD-10-CM

## 2021-05-26 NOTE — Therapy (Signed)
[facility] Physical Therapy [address] [place], [place], [address] Phone: [phone]   Fax:  [phone]  Physical Therapy Treatment  Patient Details  Name: Randy Braun MRN: [id number] Date of Birth: 09-Oct-1986 Referring Provider (PT): Lavada Mesi, MD   Encounter Date: 05/26/2021   PT End of Session - 05/26/21 0957     Visit Number 5    Number of Visits 12    Date for PT Re-Evaluation 06/16/21    Authorization Type CAFA exp 06/04/21    PT Start Time 0930    PT Stop Time 1006    PT Time Calculation (min) 36 min    Activity Tolerance Patient tolerated treatment well    Behavior During Therapy [facility] for tasks assessed/performed             Past Medical History:  Diagnosis Date   Anxiety    Depression     Past Surgical History:  Procedure Laterality Date   NO PAST SURGERIES      There were no vitals filed for this visit.   Subjective Assessment - 05/26/21 0927     Subjective fell on Sunday - being chased by a wasp/bee and tripped and fell on back felt a pop in the neck.  since then is experiencing numbness in the thumb and episodes of sharp pain from scapula into Lt upper arm. Also reports trying to do a push up and feels like Lt side "isn't working"    Limitations Lifting    Patient Stated Goals improve symptoms    Currently in Pain? No/denies                [facility] PT Assessment - 05/26/21 0001       Assessment   Medical Diagnosis M54.2 (ICD-10-CM) - Neck pain    Referring Provider (PT) Hilts, Michael, MD      Sensation   Additional Comments reports improved numbness in Lt thumb, still tingling      Strength   Overall Strength Comments improved quality of push up on Lt side noted today                           [facility] Adult PT Treatment/Exercise - 05/26/21 0001       Traction   Type of Traction Cervical    Min (lbs) 15    Max (lbs) 20    Hold Time 60    Rest Time 20    Time 10      Manual Therapy    Manual therapy comments compression Lt upper trap and levator scapula, skilled palpation during DN              Trigger Point Dry Needling - 05/26/21 0001     Consent Given? Yes    Education Handout Provided Previously provided    Muscles Treated Head and Neck Levator scapulae;Upper trapezius    Upper Trapezius Response Twitch reponse elicited    Levator Scapulae Response Twitch response elicited                    PT Short Term Goals - 05/26/21 0958       PT SHORT TERM GOAL #1   Title Independent with initial HEP    Baseline 8/2: pt reports compliance    Time 3    Period Weeks    Status Achieved    Target Date 05/26/21  PT Long Term Goals - 05/26/21 0958       PT LONG TERM GOAL #1   Title Independent with final HEP    Time 6    Period Weeks    Status On-going    Target Date 06/16/21      PT LONG TERM GOAL #2   Title FOTO score improved to 74 for improved function    Time 6    Period Weeks    Status On-going    Target Date 06/16/21      PT LONG TERM GOAL #3   Title Perform cervical AROM without pain for improved mobility    Time 6    Period Weeks    Status On-going    Target Date 06/16/21      PT LONG TERM GOAL #4   Title Report centralization of symptoms for improved function    Time 6    Period Weeks    Status On-going    Target Date 06/16/21      PT LONG TERM GOAL #5   Title Demonstrate at least 5/5 strength in LUE for improved function    Time 6    Period Weeks    Status On-going    Target Date 06/16/21                   Plan - 05/26/21 0959     Clinical Impression Statement Symptoms improved today compared to last visit, and with improved push up quality noted today as well.  Pain isolated to levator and upper trap trigger point with good response to DN and manual therpay today.  Will continue to benefit from PT to maximize function.    Personal Factors and Comorbidities Comorbidity  2;Transportation;Past/Current Experience;Finances    Comorbidities anxiety, depression    Examination-Activity Limitations Sit;Sleep;Lift;Reach Overhead    Examination-Participation Restrictions Community Activity;Occupation    Stability/Clinical Decision Making Evolving/Moderate complexity    Rehab Potential Good    PT Frequency 2x / week   1-2x/wk   PT Duration 6 weeks    PT Treatment/Interventions ADLs/Self Care Home Management;Cryotherapy;Electrical Stimulation;Moist Heat;Traction;Therapeutic exercise;Therapeutic activities;Functional mobility training;Neuromuscular re-education;Patient/family education;Manual techniques;Taping;Dry needling;Passive range of motion    PT Next Visit Plan continue with traction and DN PRN, see how DN to levator went    PT Home Exercise Plan Access Code: [email or web address]    Consulted and Agree with Plan of Care Patient             Patient will benefit from skilled therapeutic intervention in order to improve the following deficits and impairments:  Impaired tone, Increased fascial restricitons, Pain, Decreased strength, Impaired flexibility, Postural dysfunction  Visit Diagnosis: Radiculopathy, cervical region  Abnormal posture  Muscle weakness (generalized)  Other symptoms and signs involving the musculoskeletal system     Problem List Patient Active Problem List   Diagnosis Date Noted   Bipolar disorder, current episode mixed, mild (HCC) 05/05/2021   Insomnia due to other mental disorder 05/05/2021   Intermittent explosive disorder 04/28/2021   Major depressive disorder, recurrent episode, moderate (HCC) 04/28/2021   Cyst of skin 03/26/2021   Chronic right-sided low back pain with right-sided sciatica 11/24/2020   Generalized anxiety disorder 11/24/2020   Psychophysiological insomnia 11/24/2020     Clarita Crane, PT, DPT 05/26/21 10:07 AM    [facility] Physical Therapy [address] [place], [place],  [address] Phone: [phone]   Fax:  [phone]  Name: Jibri Schriefer MRN: [id number] Date of Birth: [birth year]

## 2021-05-29 ENCOUNTER — Ambulatory Visit (INDEPENDENT_AMBULATORY_CARE_PROVIDER_SITE_OTHER): Payer: Self-pay | Admitting: Physical Therapy

## 2021-05-29 ENCOUNTER — Other Ambulatory Visit: Payer: Self-pay

## 2021-05-29 DIAGNOSIS — R293 Abnormal posture: Secondary | ICD-10-CM

## 2021-05-29 DIAGNOSIS — M6281 Muscle weakness (generalized): Secondary | ICD-10-CM

## 2021-05-29 DIAGNOSIS — M5412 Radiculopathy, cervical region: Secondary | ICD-10-CM

## 2021-05-29 DIAGNOSIS — R29898 Other symptoms and signs involving the musculoskeletal system: Secondary | ICD-10-CM

## 2021-05-29 NOTE — Therapy (Signed)
[facility] Physical Therapy [address] [place], [place], [address] Phone: [phone]   Fax:  [phone]  Physical Therapy Treatment  Patient Details  Name: Randy Braun MRN: [id number] Date of Birth: 05-17-1986 Referring Provider (PT): Lavada Mesi, MD   Encounter Date: 05/29/2021   PT End of Session - 05/29/21 1007     Visit Number 6    Number of Visits 12    Date for PT Re-Evaluation 06/16/21    Authorization Type CAFA exp 06/04/21    PT Start Time 0930    PT Stop Time 1007    PT Time Calculation (min) 37 min    Activity Tolerance Patient tolerated treatment well    Behavior During Therapy [facility] for tasks assessed/performed             Past Medical History:  Diagnosis Date   Anxiety    Depression     Past Surgical History:  Procedure Laterality Date   NO PAST SURGERIES      There were no vitals filed for this visit.   Subjective Assessment - 05/29/21 0933     Subjective doing better now, work is very stressful at this time    Limitations Lifting    Patient Stated Goals improve symptoms    Currently in Pain? Yes    Pain Score 2     Pain Location Neck    Pain Orientation Left    Pain Descriptors / Indicators Aching;Sore;Tightness    Pain Type Acute pain    Pain Radiating Towards LUE into hand    Pain Onset More than a month ago    Pain Frequency Intermittent    Aggravating Factors  leaning neck to LT    Pain Relieving Factors exercises, meds, traction                               [facility] Adult PT Treatment/Exercise - 05/29/21 0001       Traction   Type of Traction Cervical    Min (lbs) 15    Max (lbs) 20    Hold Time 60    Rest Time 20    Time 12      Manual Therapy   Manual therapy comments compression and STM to Lt posterior shoulder girdle with percussive device; also demonstrated on use of cane for STM and TPR                      PT Short Term Goals - 05/26/21 0958       PT  SHORT TERM GOAL #1   Title Independent with initial HEP    Baseline 8/2: pt reports compliance    Time 3    Period Weeks    Status Achieved    Target Date 05/26/21               PT Long Term Goals - 05/26/21 0958       PT LONG TERM GOAL #1   Title Independent with final HEP    Time 6    Period Weeks    Status On-going    Target Date 06/16/21      PT LONG TERM GOAL #2   Title FOTO score improved to 74 for improved function    Time 6    Period Weeks    Status On-going    Target Date 06/16/21      PT LONG TERM GOAL #  3   Title Perform cervical AROM without pain for improved mobility    Time 6    Period Weeks    Status On-going    Target Date 06/16/21      PT LONG TERM GOAL #4   Title Report centralization of symptoms for improved function    Time 6    Period Weeks    Status On-going    Target Date 06/16/21      PT LONG TERM GOAL #5   Title Demonstrate at least 5/5 strength in LUE for improved function    Time 6    Period Weeks    Status On-going    Target Date 06/16/21                   Plan - 05/29/21 1007     Clinical Impression Statement Continues to demonstrate improvement in symptoms and good response to percussive device today (held DN).  His CAFA expires next week, so plan to reassess and may need to hold until he is able to apply for financial assistance.    Personal Factors and Comorbidities Comorbidity 2;Transportation;Past/Current Experience;Finances    Comorbidities anxiety, depression    Examination-Activity Limitations Sit;Sleep;Lift;Reach Overhead    Examination-Participation Restrictions Community Activity;Occupation    Stability/Clinical Decision Making Evolving/Moderate complexity    Rehab Potential Good    PT Frequency 2x / week   1-2x/wk   PT Duration 6 weeks    PT Treatment/Interventions ADLs/Self Care Home Management;Cryotherapy;Electrical Stimulation;Moist Heat;Traction;Therapeutic exercise;Therapeutic activities;Functional  mobility training;Neuromuscular re-education;Patient/family education;Manual techniques;Taping;Dry needling;Passive range of motion    PT Next Visit Plan continue with traction and DN PRN, reassess and continue with POC    PT Home Exercise Plan Access Code: [email or web address]    Consulted and Agree with Plan of Care Patient             Patient will benefit from skilled therapeutic intervention in order to improve the following deficits and impairments:  Impaired tone, Increased fascial restricitons, Pain, Decreased strength, Impaired flexibility, Postural dysfunction  Visit Diagnosis: Radiculopathy, cervical region  Abnormal posture  Muscle weakness (generalized)  Other symptoms and signs involving the musculoskeletal system     Problem List Patient Active Problem List   Diagnosis Date Noted   Bipolar disorder, current episode mixed, mild (HCC) 05/05/2021   Insomnia due to other mental disorder 05/05/2021   Intermittent explosive disorder 04/28/2021   Major depressive disorder, recurrent episode, moderate (HCC) 04/28/2021   Cyst of skin 03/26/2021   Chronic right-sided low back pain with right-sided sciatica 11/24/2020   Generalized anxiety disorder 11/24/2020   Psychophysiological insomnia 11/24/2020      Clarita Crane, PT, DPT 05/29/21 10:12 AM     [facility] Physical Therapy [address] [place], [place], [address] Phone: [phone]   Fax:  [phone]  Name: Randy Braun MRN: [id number] Date of Birth: 05-18-1986

## 2021-06-01 ENCOUNTER — Encounter: Payer: Self-pay | Admitting: Physical Therapy

## 2021-06-01 ENCOUNTER — Other Ambulatory Visit: Payer: Self-pay

## 2021-06-01 ENCOUNTER — Ambulatory Visit (INDEPENDENT_AMBULATORY_CARE_PROVIDER_SITE_OTHER): Payer: Self-pay | Admitting: Physical Therapy

## 2021-06-01 DIAGNOSIS — M6281 Muscle weakness (generalized): Secondary | ICD-10-CM

## 2021-06-01 DIAGNOSIS — R29898 Other symptoms and signs involving the musculoskeletal system: Secondary | ICD-10-CM

## 2021-06-01 DIAGNOSIS — M5412 Radiculopathy, cervical region: Secondary | ICD-10-CM

## 2021-06-01 DIAGNOSIS — R293 Abnormal posture: Secondary | ICD-10-CM

## 2021-06-01 NOTE — Therapy (Signed)
[facility] Physical Therapy [address] [place], [place], [address] Phone: [phone]   Fax:  [phone]  Physical Therapy Treatment  Patient Details  Name: Randy Braun MRN: [id number] Date of Birth: 12-Apr-1986 Referring Provider (PT): Lavada Mesi, MD   Encounter Date: 06/01/2021   PT End of Session - 06/01/21 1542     Visit Number 7    Number of Visits 12    Date for PT Re-Evaluation 06/16/21    Authorization Type CAFA exp 06/04/21    PT Start Time 1512    PT Stop Time 1543    PT Time Calculation (min) 31 min    Activity Tolerance Patient tolerated treatment well    Behavior During Therapy [facility] for tasks assessed/performed             Past Medical History:  Diagnosis Date   Anxiety    Depression     Past Surgical History:  Procedure Laterality Date   NO PAST SURGERIES      There were no vitals filed for this visit.   Subjective Assessment - 06/01/21 1513     Subjective had a stressful day at work, so he's worked up today.    Limitations Lifting    Patient Stated Goals improve symptoms    Currently in Pain? No/denies   denies pain, reports discomfort                              [facility] Adult PT Treatment/Exercise - 06/01/21 1533       Traction   Type of Traction Cervical    Min (lbs) 15    Max (lbs) 20    Hold Time 60    Rest Time 20    Time 12      Manual Therapy   Manual therapy comments compression and STM to Lt levator scapula              Trigger Point Dry Needling - 06/01/21 1534     Consent Given? Yes    Education Handout Provided Previously provided    Muscles Treated Head and Neck Levator scapulae    Levator Scapulae Response Twitch response elicited                    PT Short Term Goals - 05/26/21 0958       PT SHORT TERM GOAL #1   Title Independent with initial HEP    Baseline 8/2: pt reports compliance    Time 3    Period Weeks    Status Achieved    Target  Date 05/26/21               PT Long Term Goals - 05/26/21 0958       PT LONG TERM GOAL #1   Title Independent with final HEP    Time 6    Period Weeks    Status On-going    Target Date 06/16/21      PT LONG TERM GOAL #2   Title FOTO score improved to 74 for improved function    Time 6    Period Weeks    Status On-going    Target Date 06/16/21      PT LONG TERM GOAL #3   Title Perform cervical AROM without pain for improved mobility    Time 6    Period Weeks    Status On-going    Target Date 06/16/21  PT LONG TERM GOAL #4   Title Report centralization of symptoms for improved function    Time 6    Period Weeks    Status On-going    Target Date 06/16/21      PT LONG TERM GOAL #5   Title Demonstrate at least 5/5 strength in LUE for improved function    Time 6    Period Weeks    Status On-going    Target Date 06/16/21                   Plan - 06/01/21 1542     Clinical Impression Statement Pt with reports of decreasing pain overall, and improved symptoms noted.  Will continue to benefit from PT to improve symptoms.  His CAFA expires this week so will reassess and hold until we await financial assistance infomation.    Personal Factors and Comorbidities Comorbidity 2;Transportation;Past/Current Experience;Finances    Comorbidities anxiety, depression    Examination-Activity Limitations Sit;Sleep;Lift;Reach Overhead    Examination-Participation Restrictions Community Activity;Occupation    Stability/Clinical Decision Making Evolving/Moderate complexity    Rehab Potential Good    PT Frequency 2x / week   1-2x/wk   PT Duration 6 weeks    PT Treatment/Interventions ADLs/Self Care Home Management;Cryotherapy;Electrical Stimulation;Moist Heat;Traction;Therapeutic exercise;Therapeutic activities;Functional mobility training;Neuromuscular re-education;Patient/family education;Manual techniques;Taping;Dry needling;Passive range of motion    PT Next Visit  Plan check goals, hold PT    PT Home Exercise Plan Access Code: [email or web address]    Consulted and Agree with Plan of Care Patient             Patient will benefit from skilled therapeutic intervention in order to improve the following deficits and impairments:  Impaired tone, Increased fascial restricitons, Pain, Decreased strength, Impaired flexibility, Postural dysfunction  Visit Diagnosis: Radiculopathy, cervical region  Abnormal posture  Muscle weakness (generalized)  Other symptoms and signs involving the musculoskeletal system     Problem List Patient Active Problem List   Diagnosis Date Noted   Bipolar disorder, current episode mixed, mild (HCC) 05/05/2021   Insomnia due to other mental disorder 05/05/2021   Intermittent explosive disorder 04/28/2021   Major depressive disorder, recurrent episode, moderate (HCC) 04/28/2021   Cyst of skin 03/26/2021   Chronic right-sided low back pain with right-sided sciatica 11/24/2020   Generalized anxiety disorder 11/24/2020   Psychophysiological insomnia 11/24/2020      Clarita Crane, PT, DPT 06/01/21 3:48 PM      [facility] Physical Therapy [address] [place], [place], [address] Phone: [phone]   Fax:  [phone]  Name: Tag Wurtz MRN: [id number] Date of Birth: 1986/02/19

## 2021-06-03 ENCOUNTER — Encounter: Payer: Self-pay | Admitting: Rehabilitative and Restorative Service Providers"

## 2021-06-03 ENCOUNTER — Encounter: Payer: Self-pay | Admitting: Physical Therapy

## 2021-06-03 ENCOUNTER — Ambulatory Visit (INDEPENDENT_AMBULATORY_CARE_PROVIDER_SITE_OTHER): Payer: No Payment, Other | Admitting: Licensed Clinical Social Worker

## 2021-06-03 ENCOUNTER — Ambulatory Visit (INDEPENDENT_AMBULATORY_CARE_PROVIDER_SITE_OTHER): Payer: Self-pay | Admitting: Physical Therapy

## 2021-06-03 ENCOUNTER — Other Ambulatory Visit: Payer: Self-pay

## 2021-06-03 DIAGNOSIS — R29898 Other symptoms and signs involving the musculoskeletal system: Secondary | ICD-10-CM

## 2021-06-03 DIAGNOSIS — F3161 Bipolar disorder, current episode mixed, mild: Secondary | ICD-10-CM | POA: Diagnosis not present

## 2021-06-03 DIAGNOSIS — M5412 Radiculopathy, cervical region: Secondary | ICD-10-CM

## 2021-06-03 DIAGNOSIS — M6281 Muscle weakness (generalized): Secondary | ICD-10-CM

## 2021-06-03 DIAGNOSIS — R293 Abnormal posture: Secondary | ICD-10-CM

## 2021-06-03 NOTE — Therapy (Signed)
[facility] Physical Therapy [address] [place], [place], [address] Phone: [phone]   Fax:  [phone]  Physical Therapy Treatment  Patient Details  Name: Randy Braun MRN: [id number] Date of Birth: 06/13/1986 Referring Provider (PT): Eunice Blase, MD   Encounter Date: 06/03/2021   PT End of Session - 06/03/21 1011     Visit Number 8    Number of Visits 12    Date for PT Re-Evaluation 06/16/21    Authorization Type CAFA exp 06/04/21    PT Start Time 1010    PT Stop Time 1047    PT Time Calculation (min) 37 min    Activity Tolerance Patient tolerated treatment well    Behavior During Therapy [facility] for tasks assessed/performed             Past Medical History:  Diagnosis Date   Anxiety    Depression     Past Surgical History:  Procedure Laterality Date   NO PAST SURGERIES      There were no vitals filed for this visit.   Subjective Assessment - 06/03/21 1013     Subjective financial assistance expires this week, will need to hold PT after today. tingling is improved, arm sometimes goes to sleep    Limitations Lifting    Patient Stated Goals improve symptoms    Currently in Pain? No/denies                [facility] PT Assessment - 06/03/21 1013       Assessment   Medical Diagnosis M54.2 (ICD-10-CM) - Neck pain    Referring Provider (PT) Hilts, Michael, MD      AROM   Overall AROM Comments mild pain with Lt cervical sidebending, otherwise no pain with other movements      Strength   Left Shoulder Flexion 5/5    Left Shoulder ABduction 5/5                           [facility] Adult PT Treatment/Exercise - 06/03/21 0001       Traction   Type of Traction Cervical    Min (lbs) 15    Max (lbs) 20    Hold Time 60    Rest Time 20    Time 12      Manual Therapy   Manual therapy comments compression and STM to Lt levator scapula              Trigger Point Dry Needling - 06/03/21 1045     Consent Given?  Yes    Education Handout Provided Previously provided    Muscles Treated Head and Neck Levator scapulae    Muscles Treated Upper Quadrant Subscapularis    Levator Scapulae Response Twitch response elicited    Subscapularis Response Twitch response elicited                    PT Short Term Goals - 05/26/21 0958       PT SHORT TERM GOAL #1   Title Independent with initial HEP    Baseline 8/2: pt reports compliance    Time 3    Period Weeks    Status Achieved    Target Date 05/26/21               PT Long Term Goals - 06/03/21 1011       PT LONG TERM GOAL #1   Title Independent with final HEP  Time 6    Period Weeks    Status On-going    Target Date 06/16/21      PT LONG TERM GOAL #2   Title FOTO score improved to 74 for improved function    Baseline 8/10: 72    Time 6    Period Weeks    Status On-going    Target Date 06/16/21      PT LONG TERM GOAL #3   Title Perform cervical AROM without pain for improved mobility    Baseline 8/10: improved; only min pain with Lt sidebending    Time 6    Period Weeks    Status On-going    Target Date 06/16/21      PT LONG TERM GOAL #4   Title Report centralization of symptoms for improved function    Time 6    Period Weeks    Status Achieved      PT LONG TERM GOAL #5   Title Demonstrate at least 5/5 strength in LUE for improved function    Time 6    Period Weeks    Status Achieved                   Plan - 06/03/21 1047     Clinical Impression Statement Pt has met 2 LTGs to date and overall demonstrated progress towards all LTGs.  His insurance expires tomorrow, so he will need to meet with a financial counselor to determine eligibility for additional coverage so at this time we are holding PT as he is unable to proceed as self pay.  Will d/c if he doesn't return in 30 days, otherwise will plan to reassess and continue as indicated.    Personal Factors and Comorbidities Comorbidity  2;Transportation;Past/Current Experience;Finances    Comorbidities anxiety, depression    Examination-Activity Limitations Sit;Sleep;Lift;Reach Overhead    Examination-Participation Restrictions Community Activity;Occupation    Stability/Clinical Decision Making Evolving/Moderate complexity    Rehab Potential Good    PT Frequency 2x / week   1-2x/wk   PT Duration 6 weeks    PT Treatment/Interventions ADLs/Self Care Home Management;Cryotherapy;Electrical Stimulation;Moist Heat;Traction;Therapeutic exercise;Therapeutic activities;Functional mobility training;Neuromuscular re-education;Patient/family education;Manual techniques;Taping;Dry needling;Passive range of motion    PT Next Visit Plan hold PT, reassess if pt returns    PT Home Exercise Plan Access Code: [email or web address]    Consulted and Agree with Plan of Care Patient             Patient will benefit from skilled therapeutic intervention in order to improve the following deficits and impairments:  Impaired tone, Increased fascial restricitons, Pain, Decreased strength, Impaired flexibility, Postural dysfunction  Visit Diagnosis: Radiculopathy, cervical region  Abnormal posture  Muscle weakness (generalized)  Other symptoms and signs involving the musculoskeletal system     Problem List Patient Active Problem List   Diagnosis Date Noted   Bipolar disorder, current episode mixed, mild ([place]) 05/05/2021   Insomnia due to other mental disorder 05/05/2021   Intermittent explosive disorder 04/28/2021   Major depressive disorder, recurrent episode, moderate ([place]) 04/28/2021   Cyst of skin 03/26/2021   Chronic right-sided low back pain with right-sided sciatica 11/24/2020   Generalized anxiety disorder 11/24/2020   Psychophysiological insomnia 11/24/2020      Laureen Abrahams, PT, DPT 06/03/21 10:59 AM       [facility] Physical Therapy [address] [place], [place], [address] Phone: [phone]    Fax:  [phone]  Name: Randy Braun MRN: [id number] Date of Birth: April 26, 1986

## 2021-06-03 NOTE — Progress Notes (Signed)
   THERAPIST PROGRESS NOTE  Session Time: 29  Participation Level: Active  Behavioral Response: CasualAlertAnxious  Type of Therapy: Individual Therapy  Treatment Goals addressed: bipolar depression  Interventions: CBT and Supportive  Summary: Randy Braun is a 35 y.o. male who presents with anxious mood\affect.  Thought process for patient was rapid and tangential. Aiken was alert and oriented x5.  He was pleasant, cooperative, and maintained good eye contact.  Primary stressor for patient is family conflict and work.  Patient currently works in a Dentist.  He states that the job is stressful due to the employees that he currently works with.  Patient reports that his manager may be cutting his hours short on a weekly basis which is something that he has to check and make sure that he is getting paid properly. Derion reports tension and worry due to disgruntled customers.  Patient currently feels that he has no way out.  LCSW and patient spoke about the best time to go looking for job is when you have a job.   Patient has also exhibited stressors for family.  Patient reports that his mother texted him a randomly. Jamorian states that this happens a few times per year, patient reports that mother was not a part of his life growing up.  LCSW spoke with patient about prioritization, making sure that he is taking care of himself first before he handles external factors. Suicidal/Homicidal: NAwithout intent/plan  Therapist Response:    Intervention/Plan: LCSW utilized cognitive behavioral therapy and supportive therapy in today's session.  Prioritization was utilized in session today.  LCSW utilizes calm and collective tone.  LCSW utilized Chartered certified accountant.  LCSW utilized supportive therapy as evidenced by giving patient resources to [place] works.  Plan for patient moving forward is to follow-up with LCSW in 4 weeks and follow-up with [place] works or [employer] for  employment opportunities.  Plan: Return again in 4 weeks.      Weber Cooks, LCSW 06/03/2021

## 2021-06-11 ENCOUNTER — Encounter: Payer: Self-pay | Admitting: Family

## 2021-06-17 ENCOUNTER — Ambulatory Visit: Payer: Self-pay

## 2021-06-24 ENCOUNTER — Encounter (HOSPITAL_COMMUNITY): Payer: Self-pay | Admitting: Physician Assistant

## 2021-06-26 ENCOUNTER — Other Ambulatory Visit: Payer: Self-pay

## 2021-06-26 ENCOUNTER — Ambulatory Visit (INDEPENDENT_AMBULATORY_CARE_PROVIDER_SITE_OTHER): Payer: Self-pay | Admitting: Rehabilitative and Restorative Service Providers"

## 2021-06-26 ENCOUNTER — Encounter: Payer: Self-pay | Admitting: Rehabilitative and Restorative Service Providers"

## 2021-06-26 DIAGNOSIS — M5412 Radiculopathy, cervical region: Secondary | ICD-10-CM

## 2021-06-26 DIAGNOSIS — M6281 Muscle weakness (generalized): Secondary | ICD-10-CM

## 2021-06-26 DIAGNOSIS — R29898 Other symptoms and signs involving the musculoskeletal system: Secondary | ICD-10-CM

## 2021-06-26 DIAGNOSIS — R293 Abnormal posture: Secondary | ICD-10-CM

## 2021-06-26 NOTE — Patient Instructions (Signed)
Access Code: [facility] URL: [email or web address] Date: 06/26/2021 Prepared by: Pauletta Browns  Exercises Supine Cervical Retraction with Towel - 3-5 x daily - 7 x weekly - 1 sets - 10 reps - 10 sec hold Seated Cervical Retraction - 3-5 x daily - 7 x weekly - 1 sets - 10 reps - 10 sec hold Standing Backward Shoulder Rolls - 2 x daily - 7 x weekly - 10 reps - 1 sets Seated Scapular Retraction - 2 x daily - 7 x weekly - 10 reps - 1 sets - 5 sec hold Seated Upper Trapezius Stretch (Mirrored) - 2 x daily - 7 x weekly - 1 sets - 5 reps - 15 hold Standing Scapular Retraction - 5 x daily - 7 x weekly - 1 sets - 5 reps - 5 second hold Scapular Retraction with Resistance - 1 x daily - 7 x weekly - 1-2 sets - 20 reps - 3 seconds hold Shoulder External Rotation with Anchored Resistance with Towel Under Elbow - 1 x daily - 3 x weekly - 1-2 sets - 10 reps - 3 hold  Patient Education Trigger Point Dry Needling

## 2021-06-26 NOTE — Therapy (Addendum)
[facility] Physical Therapy [address] [place], [place], [address] Phone: [phone]   Fax:  [phone]  Physical Therapy Treatment/Discharge  Patient Details  Name: Randy Braun MRN: [id number] Date of Birth: 02/04/1986 Referring Provider (PT): Eunice Blase, MD   Encounter Date: 06/26/2021   PT End of Session - 06/26/21 1658     Visit Number 9    Number of Visits 12    Date for PT Re-Evaluation 06/16/21    Authorization Type CAFA exp 06/04/21    PT Start Time 1430    PT Stop Time 1515    PT Time Calculation (min) 45 min    Activity Tolerance Patient tolerated treatment well;No increased pain    Behavior During Therapy WFL for tasks assessed/performed             Past Medical History:  Diagnosis Date   Anxiety    Depression     Past Surgical History:  Procedure Laterality Date   NO PAST SURGERIES      There were no vitals filed for this visit.   Subjective Assessment - 06/26/21 1449     Subjective Jerrold notes symptoms have improved "about 50%" from the start of care.  Symptoms are still to the fingers but are less frequent and less intense.  Yusif notes symptoms are now noted in certain positions vs all the time.    Limitations Lifting    Patient Stated Goals improve symptoms    Currently in Pain? No/denies    Pain Score 0-No pain    Pain Location Neck    Pain Orientation Left    Pain Descriptors / Indicators Tiring    Pain Type Acute pain    Pain Radiating Towards L hand    Pain Onset More than a month ago    Pain Frequency Intermittent    Aggravating Factors  Poor posture, lifting and fatigue    Pain Relieving Factors Exercise, postural correction, traction    Effect of Pain on Daily Activities Limits lifting at work and normal workout activities    Multiple Pain Sites No                               [facility] Adult PT Treatment/Exercise - 06/26/21 0001       Therapeutic Activites    Therapeutic  Activities ADL's    ADL's Discussed lifting techniques, posture, spine anatomy and body mechanics      Exercises   Exercises Neck      Neck Exercises: Theraband   Rows 20 reps;Blue;Limitations    Rows Limitations Pull to chest palms in and squeeze scapulae at the top    Shoulder External Rotation 10 reps;Blue;Limitations    Shoulder External Rotation Limitations Perfect posture      Neck Exercises: Standing   Other Standing Exercises Shoulder blade pinches 10X 5 seconds      Traction   Type of Traction Cervical    Min (lbs) 15    Max (lbs) 25    Hold Time 8 minutes static    Rest Time 1 minute static    Time 10 minutes                    PT Education - 06/26/21 1657     Education Details Reviewed posture and body mechanicseducation for his job and computer use.  Discussed cervical spine anatomy and added 2 new postural/scapular strength exercises.  Person(s) Educated Patient    Methods Explanation;Demonstration;Verbal cues;Handout    Comprehension Verbal cues required;Returned demonstration;Verbalized understanding;Need further instruction              PT Short Term Goals - 05/26/21 0958       PT SHORT TERM GOAL #1   Title Independent with initial HEP    Baseline 8/2: pt reports compliance    Time 3    Period Weeks    Status Achieved    Target Date 05/26/21               PT Long Term Goals - 06/03/21 1011       PT LONG TERM GOAL #1   Title Independent with final HEP    Time 6    Period Weeks    Status On-going    Target Date 06/16/21      PT LONG TERM GOAL #2   Title FOTO score improved to 74 for improved function    Baseline 8/10: 72    Time 6    Period Weeks    Status On-going    Target Date 06/16/21      PT LONG TERM GOAL #3   Title Perform cervical AROM without pain for improved mobility    Baseline 8/10: improved; only min pain with Lt sidebending    Time 6    Period Weeks    Status On-going    Target Date 06/16/21       PT LONG TERM GOAL #4   Title Report centralization of symptoms for improved function    Time 6    Period Weeks    Status Achieved      PT LONG TERM GOAL #5   Title Demonstrate at least 5/5 strength in LUE for improved function    Time 6    Period Weeks    Status Achieved                   Plan - 06/26/21 1659     Clinical Impression Statement Mesiah reports symptoms have improved "50% since starting PT."  We reviewed some body mechanics, anatomy and postural education, progressed weight with traction to the recommended 25 pounds for measurable joint seperation, and added some postural strengthening activities.  He will benefit from another few visits with traction and postural strength progressions for return to pain-free function.    Personal Factors and Comorbidities Comorbidity 2;Transportation;Past/Current Experience;Finances    Comorbidities anxiety, depression    Examination-Activity Limitations Sit;Sleep;Lift;Reach Overhead    Examination-Participation Restrictions Community Activity;Occupation    Stability/Clinical Decision Making Evolving/Moderate complexity    Rehab Potential Good    PT Frequency 2x / week   1-2x/wk   PT Duration 6 weeks    PT Treatment/Interventions ADLs/Self Care Home Management;Cryotherapy;Electrical Stimulation;Moist Heat;Traction;Therapeutic exercise;Therapeutic activities;Functional mobility training;Neuromuscular re-education;Patient/family education;Manual techniques;Taping;Dry needling;Passive range of motion    PT Next Visit Plan 25-30 pounds of traction, postural and cervical strength progressions    PT Home Exercise Plan Access Code: [email or web address]    Consulted and Agree with Plan of Care Patient             Patient will benefit from skilled therapeutic intervention in order to improve the following deficits and impairments:  Impaired tone, Increased fascial restricitons, Pain, Decreased strength, Impaired flexibility, Postural  dysfunction  Visit Diagnosis: Abnormal posture  Muscle weakness (generalized)  Radiculopathy, cervical region  Other symptoms and signs involving the musculoskeletal system     Problem List Patient  Active Problem List   Diagnosis Date Noted   Bipolar disorder, current episode mixed, mild ([place]) 05/05/2021   Insomnia due to other mental disorder 05/05/2021   Intermittent explosive disorder 04/28/2021   Major depressive disorder, recurrent episode, moderate ([place]) 04/28/2021   Cyst of skin 03/26/2021   Chronic right-sided low back pain with right-sided sciatica 11/24/2020   Generalized anxiety disorder 11/24/2020   Psychophysiological insomnia 11/24/2020    Farley Ly PT, MPT 06/26/2021, 5:02 PM  PHYSICAL THERAPY DISCHARGE SUMMARY  Visits from Start of Care: 9  Current functional level related to goals / functional outcomes: See note   Remaining deficits: See Note   Education / Equipment: HEP   Patient agrees to discharge. Patient goals were partially met. Patient is being discharged due to not returning since the last visit. Scot Jun, PT, DPT, OCS, ATC 07/31/21  1:30 PM     [facility] Physical Therapy [address] [place], [place], [address] Phone: [phone]   Fax:  [phone]  Name: Collins Kerby MRN: [id number] Date of Birth: 08/24/1986

## 2021-07-01 ENCOUNTER — Ambulatory Visit (HOSPITAL_COMMUNITY): Payer: No Payment, Other | Admitting: Licensed Clinical Social Worker

## 2021-07-01 ENCOUNTER — Ambulatory Visit (INDEPENDENT_AMBULATORY_CARE_PROVIDER_SITE_OTHER): Payer: No Payment, Other | Admitting: Physician Assistant

## 2021-07-01 ENCOUNTER — Other Ambulatory Visit: Payer: Self-pay

## 2021-07-01 DIAGNOSIS — F3161 Bipolar disorder, current episode mixed, mild: Secondary | ICD-10-CM

## 2021-07-01 DIAGNOSIS — F411 Generalized anxiety disorder: Secondary | ICD-10-CM | POA: Diagnosis not present

## 2021-07-01 DIAGNOSIS — F310 Bipolar disorder, current episode hypomanic: Secondary | ICD-10-CM | POA: Insufficient documentation

## 2021-07-01 DIAGNOSIS — F5105 Insomnia due to other mental disorder: Secondary | ICD-10-CM | POA: Diagnosis not present

## 2021-07-01 DIAGNOSIS — F5104 Psychophysiologic insomnia: Secondary | ICD-10-CM | POA: Diagnosis not present

## 2021-07-01 DIAGNOSIS — F99 Mental disorder, not otherwise specified: Secondary | ICD-10-CM

## 2021-07-01 MED ORDER — HYDROXYZINE HCL 10 MG PO TABS: 10.0000 mg | ORAL_TABLET | Freq: Every evening | ORAL | 1 refills | Status: DC | PRN

## 2021-07-01 MED ORDER — TRAZODONE HCL 50 MG PO TABS: 50.0000 mg | ORAL_TABLET | Freq: Every day | ORAL | 1 refills | Status: DC

## 2021-07-01 MED ORDER — ARIPIPRAZOLE 5 MG PO TABS
5.0000 mg | ORAL_TABLET | Freq: Every day | ORAL | 1 refills | Status: DC
  Filled 2021-08-21: qty 30, 30d supply, fill #0

## 2021-07-01 NOTE — Progress Notes (Signed)
   THERAPIST PROGRESS NOTE  Session Time: 28  Participation Level: Active  Behavioral Response: CasualAlertDepressed and flat   Type of Therapy: Individual Therapy  Treatment Goals addressed: Coping and Diagnosis: Depression   Interventions: Motivational Interviewing, Supportive, and Reframing  Summary: Randy Braun is a 35 y.o. male who presents with depressed\anxious mood and affect.  Patient was pleasant, cooperative, and maintained good eye contact.  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  He reports that he did cash out all his vacation time to take a couple weeks off for himself.  During that time patient reports plenty of rest and states that he got into "NFT and Crypto".  He reports that this is something that he is very passionate about but knows very little about.  He states "I have made more friends in the last couple weeks doing NFTs than I have in the last 10 years living in [place]".  Patient reports multiple anger outbursts in the last 4 weeks stating that as he has started working his new hobby he tends to have anger outburst towards the people that he works with.    suicidal/Homicidal: NAwithout intent/plan  Therapist Response:    Intervention/Plan: LCSW offered solution focused therapy.  By offering him resources to the [employer] for anger management classes and support groups.  LCSW spoke in depth about the benefits of anger management classes.  Plan for patient is to follow-up with [facility]  [employer] in the next 7 business days.  LCSW got verbal consent from patient to send out referral to anger management classes so they can follow-up with him.  LCSW offered supportive therapy in the form of praise and encouragement.  LCSW offered patient unconditional positive  regard in regards to seeking out treatment for his mental health.  LCSW utilized narrative therapy in today's session providing patient explain childhood trauma of abandonment issues with his mother.  LCSW utilized genuineness Speaking on trauma.  LCSW utilized motivational interviewing by actively listening by expressing empathy.  Plan: Return again in 4 weeks.     Weber Cooks, LCSW 07/01/2021

## 2021-07-02 ENCOUNTER — Encounter (HOSPITAL_COMMUNITY): Payer: Self-pay | Admitting: Physician Assistant

## 2021-07-02 NOTE — Progress Notes (Signed)
[facility] MD/PA/NP OP Progress Note  07/02/2021 11:49 PM Randy Braun  MRN:  [id number]  Chief Complaint:  Chief Complaint   Medication Management    HPI:   Randy Braun  Visit Diagnosis:    ICD-10-CM   1. Bipolar disorder, current episode mixed, mild (HCC)  F31.61 ARIPiprazole (ABILIFY) 5 MG tablet    2. Insomnia due to other mental disorder  F51.05 traZODone (DESYREL) 50 MG tablet   F99     3. Anxiety state  F41.1 hydrOXYzine (ATARAX/VISTARIL) 10 MG tablet    4. Psychophysiological insomnia  F51.04 hydrOXYzine (ATARAX/VISTARIL) 10 MG tablet      Past Psychiatric History:  Bipolar disorder Insomnia Anxiety  Past Medical History:  Past Medical History:  Diagnosis Date   Anxiety    Depression     Past Surgical History:  Procedure Laterality Date   NO PAST SURGERIES      Family Psychiatric History:  Patient is unsure of family psychiatric history  Family History:  Family History  Family history unknown: Yes    Social History:  Social History   Socioeconomic History   Marital status: Single    Spouse name: Not on file   Number of children: Not on file   Years of education: Not on file   Highest education level: Not on file  Occupational History   Not on file  Tobacco Use   Smoking status: Never   Smokeless tobacco: Never  Vaping Use   Vaping Use: Every day   Substances: Nicotine, THC  Substance and Sexual Activity   Alcohol use: Yes    Comment: occasionally   Drug use: Yes    Types: Marijuana   Sexual activity: Not on file  Other Topics Concern   Not on file  Social History Narrative   Not on file   Social Determinants of Health   Financial Resource Strain: Medium Risk   Difficulty of Paying Living Expenses: Somewhat hard  Food Insecurity: No Food Insecurity   Worried About Running Out of Food in the Last Year: Never true   Ran Out of Food in the Last Year: Never true  Transportation Needs: No Transportation Needs   Lack of Transportation  (Medical): No   Lack of Transportation (Non-Medical): No  Physical Activity: Inactive   Days of Exercise per Week: 0 days   Minutes of Exercise per Session: 0 min  Stress: Stress Concern Present   Feeling of Stress : To some extent  Social Connections: Socially Isolated   Frequency of Communication with Friends and Family: More than three times a week   Frequency of Social Gatherings with Friends and Family: Once a week   Attends Religious Services: Never   Database administrator or Organizations: No   Attends Banker Meetings: Never   Marital Status: Never married    Allergies:  Allergies  Allergen Reactions   Vinegar [Acetic Acid] Other (See Comments)    Red Vinegar- flush    Metabolic Disorder Labs: Lab Results  Component Value Date   HGBA1C 5.8 (H) 02/27/2021   No results found for: PROLACTIN Lab Results  Component Value Date   CHOL 134 02/27/2021   TRIG 57 02/27/2021   HDL 55 02/27/2021   CHOLHDL 2.4 02/27/2021   LDLCALC 67 02/27/2021   Lab Results  Component Value Date   TSH 1.740 11/24/2020    Therapeutic Level Labs: No results found for: LITHIUM No results found for: VALPROATE No components found for:  CBMZ  Current Medications: Current Outpatient Medications  Medication Sig Dispense Refill   ARIPiprazole (ABILIFY) 5 MG tablet Take 1 tablet (5 mg total) by mouth daily. 30 tablet 1   baclofen (LIORESAL) 10 MG tablet Take 0.5-1 tablets (5-10 mg total) by mouth 3 (three) times daily as needed for muscle spasms. 30 each 3   diclofenac (VOLTAREN) 75 MG EC tablet Take 1 tablet (75 mg total) by mouth 2 (two) times daily as needed. 60 tablet 3   hydrOXYzine (ATARAX/VISTARIL) 10 MG tablet Take 1 tablet (10 mg total) by mouth at bedtime as needed. 60 tablet 1   ibuprofen (ADVIL) 800 MG tablet Take 800 mg by mouth every 8 (eight) hours as needed.     meloxicam (MOBIC) 7.5 MG tablet Take 7.5 mg by mouth daily.     traZODone (DESYREL) 50 MG tablet Take  1 tablet (50 mg total) by mouth at bedtime. 30 tablet 1   No current facility-administered medications for this visit.     Musculoskeletal: Strength & Muscle Tone: within normal limits Gait & Station: normal Patient leans: N/A  Psychiatric Specialty Exam: Review of Systems  Psychiatric/Behavioral:  Positive for agitation, decreased concentration and sleep disturbance. Negative for dysphoric mood, hallucinations, self-injury and suicidal ideas. The patient is nervous/anxious. The patient is not hyperactive.    Blood pressure 109/63, pulse 61, height 5\' 10"  (1.778 m), weight 187 lb (84.8 kg), SpO2 100 %.Body mass index is 26.83 kg/m.  General Appearance: Well Groomed  Eye Contact:  Good  Speech:  Clear and Coherent and Normal Rate  Volume:  Normal  Mood:  Anxious and Euthymic  Affect:  Appropriate and Congruent  Thought Process:  Coherent, Goal Directed, and Descriptions of Associations: Intact  Orientation:  Full (Time, Place, and Person)  Thought Content: WDL   Suicidal Thoughts:  No  Homicidal Thoughts:  No  Memory:  Immediate;   Good Recent;   Good Remote;   Good  Judgement:  Good  Insight:  Good  Psychomotor Activity:  Normal  Concentration:  Concentration: Good and Attention Span: Good  Recall:  Good  Fund of Knowledge: Good  Language: Good  Akathisia:  NA  Handed:  Right  AIMS (if indicated): not done  Assets:  Communication Skills Desire for Improvement Housing Vocational/Educational  ADL's:  Intact  Cognition: WNL  Sleep:  Fair   Screenings: GAD-7    Flowsheet Row Office Visit from 07/01/2021 in [facility] Office Visit from 05/05/2021 in [facility] Office Visit from 02/27/2021 in Primary Care at [facility] Office Visit from 11/24/2020 in Primary Care at [facility]  Total GAD-7 Score 5 11 6 20       PHQ2-9    Flowsheet Row Office Visit from 07/01/2021 in [facility]  Office Visit from 05/05/2021 in [facility] Counselor from 04/28/2021 in [facility] Office Visit from 04/24/2021 in Primary Care at [facility] Office Visit from 03/27/2021 in Primary Care at [facility] Total Score 0 1 4 1 2   PHQ-9 Total Score -- -- 8 5 3       Flowsheet Row Office Visit from 07/01/2021 in [facility] Office Visit from 05/05/2021 in [facility] Counselor from 04/28/2021 in [facility]  C-SSRS RISK CATEGORY Moderate Risk High Risk No Risk        Assessment and Plan:     1.  Bipolar disorder, current episode mixed, mild (HCC)  - ARIPiprazole (ABILIFY) 5 MG tablet; Take 1 tablet (5 mg total) by mouth daily.  Dispense: 30 tablet; Refill: 1  2. Insomnia due to other mental disorder  - traZODone (DESYREL) 50 MG tablet; Take 1 tablet (50 mg total) by mouth at bedtime.  Dispense: 30 tablet; Refill: 1  3. Anxiety state  - hydrOXYzine (ATARAX/VISTARIL) 10 MG tablet; Take 1 tablet (10 mg total) by mouth at bedtime as needed.  Dispense: 60 tablet; Refill: 1  4. Psychophysiological insomnia  - hydrOXYzine (ATARAX/VISTARIL) 10 MG tablet; Take 1 tablet (10 mg total) by mouth at bedtime as needed.  Dispense: 60 tablet; Refill: 1   Patient to follow up in 2 months Provider spent a total of 20 minutes with the patient/reviewing patient's chart  Meta Hatchet, PA 07/02/2021, 11:49 PM

## 2021-07-20 ENCOUNTER — Other Ambulatory Visit: Payer: Self-pay

## 2021-07-20 ENCOUNTER — Ambulatory Visit: Payer: Self-pay | Attending: Family

## 2021-07-29 ENCOUNTER — Ambulatory Visit (HOSPITAL_COMMUNITY): Payer: No Payment, Other | Admitting: Licensed Clinical Social Worker

## 2021-08-06 ENCOUNTER — Ambulatory Visit: Payer: Self-pay | Admitting: Nurse Practitioner

## 2021-08-21 ENCOUNTER — Other Ambulatory Visit: Payer: Self-pay

## 2021-08-24 ENCOUNTER — Other Ambulatory Visit: Payer: Self-pay

## 2021-08-24 MED ORDER — TRAZODONE HCL 50 MG PO TABS
ORAL_TABLET | ORAL | 1 refills | Status: DC
  Filled 2021-08-24: qty 30, 30d supply, fill #0

## 2021-08-26 NOTE — Progress Notes (Signed)
Patient ID: Randy Braun, male    DOB: 01-Aug-1986  MRN: [id number]  CC: BACK PAIN  Subjective: Randy Braun is a 35 y.o. male who presents for back pain.  His concerns today include:   BACK PAIN: Reports upper and lower back pain for at least 10 years or more. Reports in the past he was able to deal with back pain but not able to do so recently. Back pain radiating to left leg, walking makes worse. Carrying heavy groceries make worse. Reports bilateral hips off ratio, right leg longer than left leg. Had a fall about 1 month ago where he landed on his back while running away from bee, did not seek medical evaluation at that time. Planning to apply for disability due to being unable to work related to back pain.  2. NECK PAIN FOLLOW-UP: 3. TRAPEZIUS MUSCLE STRAIN LEFT FOLLOW-UP: 4. HISTORY OF SCOLIOSIS FOLLOW-UP: 04/24/2021: - Diagnostic x-ray cervical spine on 04/23/2021 resulted no evidence of cervical spine fracture or prevertebral soft tissue swelling. Alignment is normal. No other significant bone abnormalities are identified. - Continue Prednisone as prescribed.  - Continue alternating heat and ice.  - Patient reports history of scoliosis in his teenage years.  - Referral to Orthopedic Surgery for further evaluation and management.   08/27/2021: Continue neck pain. No tingling in arm. Would like to return to Physical Therapy.   Patient Active Problem List   Diagnosis Date Noted   Bipolar disorder, current episode hypomanic (HCC) 07/01/2021   Bipolar disorder, current episode mixed, mild (HCC) 05/05/2021   Insomnia due to other mental disorder 05/05/2021   Intermittent explosive disorder 04/28/2021   Major depressive disorder, recurrent episode, moderate (HCC) 04/28/2021   Cyst of skin 03/26/2021   Chronic right-sided low back pain with right-sided sciatica 11/24/2020   Generalized anxiety disorder 11/24/2020   Psychophysiological insomnia 11/24/2020     Current  Outpatient Medications on File Prior to Visit  Medication Sig Dispense Refill   ARIPiprazole (ABILIFY) 5 MG tablet Take 1 tablet (5 mg total) by mouth daily. 30 tablet 1   baclofen (LIORESAL) 10 MG tablet Take 0.5-1 tablets (5-10 mg total) by mouth 3 (three) times daily as needed for muscle spasms. 30 each 3   diclofenac (VOLTAREN) 75 MG EC tablet Take 1 tablet (75 mg total) by mouth 2 (two) times daily as needed. 60 tablet 3   hydrOXYzine (ATARAX/VISTARIL) 10 MG tablet Take 1 tablet (10 mg total) by mouth at bedtime as needed. 60 tablet 1   traZODone (DESYREL) 50 MG tablet Take 1 tablet (50 mg total) by mouth at bedtime. 30 tablet 1   ibuprofen (ADVIL) 800 MG tablet Take 800 mg by mouth every 8 (eight) hours as needed. (Patient not taking: Reported on 08/27/2021)     meloxicam (MOBIC) 7.5 MG tablet Take 7.5 mg by mouth daily. (Patient not taking: Reported on 08/27/2021)     No current facility-administered medications on file prior to visit.    Allergies  Allergen Reactions   Vinegar [Acetic Acid] Other (See Comments)    Red Vinegar- flush    Social History   Socioeconomic History   Marital status: Single    Spouse name: Not on file   Number of children: Not on file   Years of education: Not on file   Highest education level: Not on file  Occupational History   Not on file  Tobacco Use   Smoking status: Never   Smokeless tobacco: Never  Vaping Use  Vaping Use: Every day   Substances: Nicotine, THC  Substance and Sexual Activity   Alcohol use: Yes    Comment: occasionally   Drug use: Yes    Types: Marijuana   Sexual activity: Not on file  Other Topics Concern   Not on file  Social History Narrative   Not on file   Social Determinants of Health   Financial Resource Strain: Medium Risk   Difficulty of Paying Living Expenses: Somewhat hard  Food Insecurity: No Food Insecurity   Worried About Running Out of Food in the Last Year: Never true   Ran Out of Food in the Last  Year: Never true  Transportation Needs: No Transportation Needs   Lack of Transportation (Medical): No   Lack of Transportation (Non-Medical): No  Physical Activity: Inactive   Days of Exercise per Week: 0 days   Minutes of Exercise per Session: 0 min  Stress: Stress Concern Present   Feeling of Stress : To some extent  Social Connections: Socially Isolated   Frequency of Communication with Friends and Family: More than three times a week   Frequency of Social Gatherings with Friends and Family: Once a week   Attends Religious Services: Never   Database administrator or Organizations: No   Attends Engineer, structural: Never   Marital Status: Never married  Catering manager Violence: Not At Risk   Fear of Current or Ex-Partner: No   Emotionally Abused: No   Physically Abused: No   Sexually Abused: No    Family History  Family history unknown: Yes    Past Surgical History:  Procedure Laterality Date   NO PAST SURGERIES      ROS: Review of Systems Negative except as stated above  PHYSICAL EXAM: BP 122/77 (BP Location: Right Arm, Patient Position: Sitting, Cuff Size: Normal)   Pulse 61   Resp 20   Wt 186 lb (84.4 kg)   SpO2 97%   BMI 26.69 kg/m   Physical Exam HENT:     Head: Normocephalic and atraumatic.  Eyes:     Extraocular Movements: Extraocular movements intact.     Conjunctiva/sclera: Conjunctivae normal.     Pupils: Pupils are equal, round, and reactive to light.  Cardiovascular:     Rate and Rhythm: Normal rate and regular rhythm.     Pulses: Normal pulses.     Heart sounds: Normal heart sounds.  Pulmonary:     Effort: Pulmonary effort is normal.     Breath sounds: Normal breath sounds.  Musculoskeletal:     Cervical back: Normal range of motion and neck supple.  Neurological:     General: No focal deficit present.     Mental Status: He is alert and oriented to person, place, and time.  Psychiatric:        Mood and Affect: Mood normal.         Behavior: Behavior normal.   ASSESSMENT AND PLAN: 1. Chronic bilateral thoracic back pain: 2. Chronic bilateral low back pain, unspecified whether sciatica present: - Diagnostic x-ray thoracic spine for further evaluation.  - Diagnostic x-ray lumbar spine last obtained 11/24/2020. - Diagnostic x-ray cervical spine last obtained 04/23/2021. - Referral to Orthopedic Surgery for further evaluation and management.  - Referral to Physical Therapy for further evaluation and management. - DG Thoracic Spine W/Swimmers; Future - Ambulatory referral to Orthopedic Surgery - Ambulatory referral to Physical Therapy  3. Neck pain: 4. Trapezius muscle strain, left, subsequent encounter: 5. History of  scoliosis: - Referral to Orthopedic Surgery for further evaluation and management.  - Referral to Physical Therapy for further evaluation and management. - Ambulatory referral to Orthopedic Surgery - Ambulatory referral to Physical Therapy  6. Encounter for completion of form with patient: - Patient planning to apply for disability. Presents today in office without paperwork. - Counseled patient he will need an appointment for completion of disability paperwork, patient agreeable.   Patient was given the opportunity to ask questions.  Patient verbalized understanding of the plan and was able to repeat key elements of the plan. Patient was given clear instructions to go to Emergency Department or return to medical center if symptoms don't improve, worsen, or new problems develop.The patient verbalized understanding.   Orders Placed This Encounter  Procedures   DG Thoracic Spine W/Swimmers   Ambulatory referral to Orthopedic Surgery   Ambulatory referral to Physical Therapy    Follow-up with primary provider as scheduled.  Rema Fendt, NP

## 2021-08-27 ENCOUNTER — Ambulatory Visit (INDEPENDENT_AMBULATORY_CARE_PROVIDER_SITE_OTHER): Payer: Self-pay

## 2021-08-27 ENCOUNTER — Encounter: Payer: Self-pay | Admitting: Family

## 2021-08-27 ENCOUNTER — Other Ambulatory Visit: Payer: Self-pay

## 2021-08-27 ENCOUNTER — Ambulatory Visit (INDEPENDENT_AMBULATORY_CARE_PROVIDER_SITE_OTHER): Payer: Self-pay | Admitting: Family

## 2021-08-27 VITALS — BP 122/77 | HR 61 | Resp 20 | Wt 186.0 lb

## 2021-08-27 DIAGNOSIS — M545 Low back pain, unspecified: Secondary | ICD-10-CM

## 2021-08-27 DIAGNOSIS — G8929 Other chronic pain: Secondary | ICD-10-CM

## 2021-08-27 DIAGNOSIS — Z0289 Encounter for other administrative examinations: Secondary | ICD-10-CM

## 2021-08-27 DIAGNOSIS — M546 Pain in thoracic spine: Secondary | ICD-10-CM

## 2021-08-27 DIAGNOSIS — M542 Cervicalgia: Secondary | ICD-10-CM

## 2021-08-27 DIAGNOSIS — S46812D Strain of other muscles, fascia and tendons at shoulder and upper arm level, left arm, subsequent encounter: Secondary | ICD-10-CM

## 2021-08-27 DIAGNOSIS — Z8739 Personal history of other diseases of the musculoskeletal system and connective tissue: Secondary | ICD-10-CM

## 2021-08-28 NOTE — Progress Notes (Signed)
Scoliosis and mild arthritis. Will continue with plan discussed in office on 08/27/2021.

## 2021-08-31 ENCOUNTER — Other Ambulatory Visit: Payer: Self-pay

## 2021-09-03 ENCOUNTER — Other Ambulatory Visit: Payer: Self-pay

## 2021-09-03 ENCOUNTER — Encounter (HOSPITAL_COMMUNITY): Payer: Self-pay | Admitting: Physician Assistant

## 2021-09-03 ENCOUNTER — Ambulatory Visit (INDEPENDENT_AMBULATORY_CARE_PROVIDER_SITE_OTHER): Payer: No Payment, Other | Admitting: Physician Assistant

## 2021-09-03 DIAGNOSIS — F3161 Bipolar disorder, current episode mixed, mild: Secondary | ICD-10-CM

## 2021-09-03 DIAGNOSIS — F411 Generalized anxiety disorder: Secondary | ICD-10-CM

## 2021-09-03 DIAGNOSIS — F5105 Insomnia due to other mental disorder: Secondary | ICD-10-CM | POA: Diagnosis not present

## 2021-09-03 DIAGNOSIS — F99 Mental disorder, not otherwise specified: Secondary | ICD-10-CM

## 2021-09-03 DIAGNOSIS — F5104 Psychophysiologic insomnia: Secondary | ICD-10-CM | POA: Diagnosis not present

## 2021-09-03 MED ORDER — HYDROXYZINE HCL 10 MG PO TABS
10.0000 mg | ORAL_TABLET | Freq: Every evening | ORAL | 1 refills | Status: DC | PRN
  Filled 2021-09-03: qty 30, 30d supply, fill #0

## 2021-09-03 MED ORDER — TRAZODONE HCL 50 MG PO TABS
50.0000 mg | ORAL_TABLET | Freq: Every day | ORAL | 1 refills | Status: DC
  Filled 2021-09-03: qty 30, 30d supply, fill #0
  Filled 2021-10-08: qty 30, 30d supply, fill #1

## 2021-09-03 MED ORDER — ARIPIPRAZOLE 5 MG PO TABS
5.0000 mg | ORAL_TABLET | Freq: Every day | ORAL | 1 refills | Status: DC
  Filled 2021-09-03 – 2021-09-21 (×2): qty 30, 30d supply, fill #0
  Filled 2021-10-21: qty 30, 30d supply, fill #1

## 2021-09-03 NOTE — Progress Notes (Signed)
BH MD/PA/NP OP Progress Note  09/03/2021 2:24 PM Randy Braun  MRN:  [id number]  Chief Complaint:  Chief Complaint   Medication Management    HPI:   Randy Braun is a 35 year old male with a past psychiatric history significant for anxiety, psychophysiological insomnia, and bipolar disorder who presents to [facility] for follow-up and medication management.  Patient is currently being managed on the following medications:  Abilify 5 mg daily Trazodone 50 mg at bedtime Hydroxyzine 10 mg at bedtime as needed  Patient reports that he had to get his medications transferred to another pharmacy.  While his medications were being transferred to another pharmacy, patient states that he was without medications for 3 to 4 days.  During the days he was not on medications, patient states that he was very irritable, agitated, and angry about his current situation.  Once he started his medications, he reports that it took a while for them to kick in but once they kicked in, patient felt more normal.  Patient endorses depression he attributes to his current living/work situation.  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  Patient reports that he has a new job but he is not getting enough hours.  Patient endorses the following depressive symptoms: lack of motivation and low mood.  Patient endorses anxiety stating that noises around the area he lives makes him nervous.  He reports that the fear of death keeps him up at night.  Patient stressors include financial instability and living in the projects.  A PHQ-9 screen was performed with the patient scoring a 9.  A GAD-7 screen was also performed with the patient scoring 11.  A [place] Suicide Severity Rating Scale was performed with the patient being considered moderate risk.  Patient is alert and oriented x4, calm, cooperative, and fully engaged in  conversation during the encounter.  Patient endorses being in a pretty good mood.  Patient denies suicidal or homicidal ideations.  He further denies auditory or visual hallucinations and does not appear to be responding to internal/external stimuli.  Patient endorses fair sleep and receives on average 5 to 6 hours of sleep each night.  Patient endorses good appetite and eats on average 3 meals and a snack.  He notes that he has been eating a lot of carbohydrates.  Patient endorses alcohol consumption.  Patient endorses nicotine use.  Patient denies illicit drug use.  Visit Diagnosis:    ICD-10-CM   1. Anxiety state  F41.1 hydrOXYzine (ATARAX/VISTARIL) 10 MG tablet    2. Psychophysiological insomnia  F51.04 hydrOXYzine (ATARAX/VISTARIL) 10 MG tablet    3. Bipolar disorder, current episode mixed, mild (HCC)  F31.61 ARIPiprazole (ABILIFY) 5 MG tablet    4. Insomnia due to other mental disorder  F51.05 traZODone (DESYREL) 50 MG tablet   F99       Past Psychiatric History:  Bipolar disorder Insomnia Anxiety  Past Medical History:  Past Medical History:  Diagnosis Date   Anxiety    Depression     Past Surgical History:  Procedure Laterality Date   NO PAST SURGERIES      Family Psychiatric History:  Patient is unsure of family psychiatric history  Family History:  Family History  Family history unknown: Yes    Social History:  Social History   Socioeconomic History   Marital status: Single    Spouse name: Not on file  Number of children: Not on file   Years of education: Not on file   Highest education level: Not on file  Occupational History   Not on file  Tobacco Use   Smoking status: Never   Smokeless tobacco: Never  Vaping Use   Vaping Use: Every day   Substances: Nicotine, THC  Substance and Sexual Activity   Alcohol use: Yes    Comment: occasionally   Drug use: Yes    Types: Marijuana   Sexual activity: Not on file  Other Topics Concern   Not on file   Social History Narrative   Not on file   Social Determinants of Health   Financial Resource Strain: Medium Risk   Difficulty of Paying Living Expenses: Somewhat hard  Food Insecurity: No Food Insecurity   Worried About Running Out of Food in the Last Year: Never true   Ran Out of Food in the Last Year: Never true  Transportation Needs: No Transportation Needs   Lack of Transportation (Medical): No   Lack of Transportation (Non-Medical): No  Physical Activity: Inactive   Days of Exercise per Week: 0 days   Minutes of Exercise per Session: 0 min  Stress: Stress Concern Present   Feeling of Stress : To some extent  Social Connections: Socially Isolated   Frequency of Communication with Friends and Family: More than three times a week   Frequency of Social Gatherings with Friends and Family: Once a week   Attends Religious Services: Never   Database administrator or Organizations: No   Attends Banker Meetings: Never   Marital Status: Never married    Allergies:  Allergies  Allergen Reactions   Vinegar [Acetic Acid] Other (See Comments)    Red Vinegar- flush    Metabolic Disorder Labs: Lab Results  Component Value Date   HGBA1C 5.8 (H) 02/27/2021   No results found for: PROLACTIN Lab Results  Component Value Date   CHOL 134 02/27/2021   TRIG 57 02/27/2021   HDL 55 02/27/2021   CHOLHDL 2.4 02/27/2021   LDLCALC 67 02/27/2021   Lab Results  Component Value Date   TSH 1.740 11/24/2020    Therapeutic Level Labs: No results found for: LITHIUM No results found for: VALPROATE No components found for:  CBMZ  Current Medications: Current Outpatient Medications  Medication Sig Dispense Refill   baclofen (LIORESAL) 10 MG tablet Take 0.5-1 tablets (5-10 mg total) by mouth 3 (three) times daily as needed for muscle spasms. 30 each 3   diclofenac (VOLTAREN) 75 MG EC tablet Take 1 tablet (75 mg total) by mouth 2 (two) times daily as needed. 60 tablet 3    ibuprofen (ADVIL) 800 MG tablet Take 800 mg by mouth every 8 (eight) hours as needed.     meloxicam (MOBIC) 7.5 MG tablet Take 7.5 mg by mouth daily.     ARIPiprazole (ABILIFY) 5 MG tablet Take 1 tablet (5 mg total) by mouth daily. 30 tablet 1   hydrOXYzine (ATARAX/VISTARIL) 10 MG tablet Take 1 tablet (10 mg total) by mouth at bedtime as needed. 60 tablet 1   traZODone (DESYREL) 50 MG tablet Take 1 tablet (50 mg total) by mouth at bedtime. 30 tablet 1   No current facility-administered medications for this visit.     Musculoskeletal: Strength & Muscle Tone: within normal limits Gait & Station: normal Patient leans: N/A  Psychiatric Specialty Exam: Review of Systems  Psychiatric/Behavioral:  Positive for sleep disturbance. Negative for decreased concentration,  dysphoric mood, hallucinations, self-injury and suicidal ideas. The patient is nervous/anxious. The patient is not hyperactive.    Blood pressure 109/72, pulse 69, height 5\' 10"  (1.778 m), weight 189 lb (85.7 kg).Body mass index is 27.12 kg/m.  General Appearance: Well Groomed  Eye Contact:  Good  Speech:  Clear and Coherent and Normal Rate  Volume:  Normal  Mood:  Anxious and Depressed  Affect:  Congruent and Depressed  Thought Process:  Coherent and Descriptions of Associations: Intact  Orientation:  Full (Time, Place, and Person)  Thought Content: WDL   Suicidal Thoughts:  No  Homicidal Thoughts:  No  Memory:  Immediate;   Good Recent;   Good Remote;   Good  Judgement:  Good  Insight:  Good  Psychomotor Activity:  Normal  Concentration:  Concentration: Good and Attention Span: Good  Recall:  Good  Fund of Knowledge: Good  Language: Good  Akathisia:  NA  Handed:  Right  AIMS (if indicated): not done  Assets:  Communication Skills Desire for Improvement Housing Vocational/Educational  ADL's:  Intact  Cognition: WNL  Sleep:  Fair   Screenings: GAD-7    Flowsheet Row Office Visit from 09/03/2021 in [facility] Office Visit from 08/27/2021 in Primary Care at [facility] Office Visit from 07/01/2021 in [facility] Office Visit from 05/05/2021 in [facility] Office Visit from 02/27/2021 in Primary Care at [facility]  Total GAD-7 Score 11 5 5 11 6       PHQ2-9    Flowsheet Row Office Visit from 09/03/2021 in [facility] Office Visit from 08/27/2021 in Primary Care at [facility] Office Visit from 07/01/2021 in [facility] Office Visit from 05/05/2021 in [facility] Counselor from 04/28/2021 in [facility]  PHQ-2 Total Score 2 2 0 1 4  PHQ-9 Total Score 9 3 -- -- 8      Flowsheet Row Office Visit from 09/03/2021 in [facility] Office Visit from 07/01/2021 in [facility] Office Visit from 05/05/2021 in [facility]  C-SSRS RISK CATEGORY Moderate Risk Moderate Risk High Risk        Assessment and Plan:   Hamsa Mallick is a 35 year old male with a past psychiatric history significant for anxiety, psychophysiological insomnia, and bipolar disorder who presents to [facility] for follow-up and medication management.  Patient reports that he was without medication for 3 to 4 days due to having to transfer his medications to a different pharmacy.  Once patient was on his medication regimen for some time, he felt more normal.  Patient continues to endorse depressive symptoms as well as anxiety related to work-related and financial stressors.  Patient would like to continue taking his medications at the same dosage to see if his medications improve his symptoms more.  Patient is requesting refills on all his medications following the conclusion of the encounter.  Patient's medications to be  e-prescribed to pharmacy of choice.  1. Anxiety state  - hydrOXYzine (ATARAX/VISTARIL) 10 MG tablet; Take 1 tablet (10 mg total) by mouth at bedtime as needed.  Dispense: 60 tablet; Refill: 1  2. Psychophysiological insomnia  - hydrOXYzine (ATARAX/VISTARIL) 10 MG tablet; Take 1 tablet (10 mg total) by mouth at bedtime as needed.  Dispense: 60 tablet; Refill: 1  3. Bipolar disorder, current episode mixed, mild (  HCC)  - ARIPiprazole (ABILIFY) 5 MG tablet; Take 1 tablet (5 mg total) by mouth daily.  Dispense: 30 tablet; Refill: 1  4. Insomnia due to other mental disorder  - traZODone (DESYREL) 50 MG tablet; Take 1 tablet (50 mg total) by mouth at bedtime.  Dispense: 30 tablet; Refill: 1  Patient to follow up in 2 months Provider spent a total of 19 minutes with the patient/reviewing the patient's chart  Meta Hatchet, PA 09/03/2021, 2:24 PM

## 2021-09-04 ENCOUNTER — Other Ambulatory Visit: Payer: Self-pay

## 2021-09-09 ENCOUNTER — Encounter (HOSPITAL_COMMUNITY): Payer: Self-pay | Admitting: Physician Assistant

## 2021-09-15 ENCOUNTER — Ambulatory Visit (INDEPENDENT_AMBULATORY_CARE_PROVIDER_SITE_OTHER): Payer: Self-pay | Admitting: Orthopaedic Surgery

## 2021-09-15 ENCOUNTER — Other Ambulatory Visit: Payer: Self-pay

## 2021-09-15 ENCOUNTER — Encounter: Payer: Self-pay | Admitting: Orthopaedic Surgery

## 2021-09-15 VITALS — BP 138/62 | HR 66 | Resp 96

## 2021-09-15 DIAGNOSIS — M5441 Lumbago with sciatica, right side: Secondary | ICD-10-CM

## 2021-09-15 DIAGNOSIS — G8929 Other chronic pain: Secondary | ICD-10-CM

## 2021-09-15 NOTE — Progress Notes (Signed)
Office Visit Note   Patient: Randy Braun           Date of Birth: 1986/08/07           MRN: [id number] Visit Date: 09/15/2021              Requested by: Rema Fendt, NP [address] [place],  [place] [address] PCP: Rema Fendt, NP   Assessment & Plan: Visit Diagnoses:  1. Chronic right-sided low back pain with right-sided sciatica     Plan: We discussed the core strengthening and stretching program that he will he can work on.  This is a new job for him and involves a lot more bending turning twisting and previous activities he is done at work.  We reviewed x-rays of his thoracic spine with minimal curvature as well as lumbar spine negative for acute changes.  Patient is neurologically intact.  He can follow-up as needed.  Follow-Up Instructions: No follow-ups on file.   Orders:  No orders of the defined types were placed in this encounter.  No orders of the defined types were placed in this encounter.     Procedures: No procedures performed   Clinical Data: No additional findings.   Subjective: Chief Complaint  Patient presents with   Spine - Follow-up    HPI 35 year old male with some thoracic scoliosis is seen with low back pain.  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  He is not taking any medication for it.  He states he was diagnosed with scoliosis as an adolescent.  Not had any previous x-rays no previous back surgeries.  No bowel or bladder symptoms.  He has had some increased discomfort over the last month since he has been at this new job.  Other problems include bipolar disorder and anxiety.  Review of Systems negative for fever chills no bowel bladder symptoms all other systems noncontributory to HPI.   Objective: Vital Signs: BP 138/62 (BP Location: Right Arm, Patient Position: Sitting, Cuff Size: Normal)    Pulse 66   Resp (!) 96   Physical Exam Constitutional:      Appearance: He is well-developed.  HENT:     Head: Normocephalic and atraumatic.     Right Ear: External ear normal.     Left Ear: External ear normal.  Eyes:     Pupils: Pupils are equal, round, and reactive to light.  Neck:     Thyroid: No thyromegaly.     Trachea: No tracheal deviation.  Cardiovascular:     Rate and Rhythm: Normal rate.  Pulmonary:     Effort: Pulmonary effort is normal.     Breath sounds: No wheezing.  Abdominal:     General: Bowel sounds are normal.     Palpations: Abdomen is soft.  Musculoskeletal:     Cervical back: Neck supple.  Skin:    General: Skin is warm and dry.     Capillary Refill: Capillary refill takes less than 2 seconds.  Neurological:     Mental Status: He is alert and oriented to person, place, and time.  Psychiatric:        Behavior: Behavior normal.        Thought Content: Thought content normal.        Judgment: Judgment normal.    Ortho Exam patient has tenderness lumbosacral junction.  No sciatic notch  tenderness negative logroll right and left hip.  Trochanteric bursa is normal.  Knees reach full extension.  Knee and ankle jerks are 2+ and symmetrical he is able  Specialty Comments:  No specialty comments available.  Imaging: CLINICAL DATA:  Chronic back pain   EXAM: THORACIC SPINE - 3 VIEWS   COMPARISON:  None   FINDINGS: Thoracolumbar scoliosis deformity is identified convexity is towards the right and centered at the T11-T12 level. The vertebral body heights are well preserved. Mild ventral endplate spurring noted. No signs of fracture or dislocation.   IMPRESSION: 1. Thoracolumbar scoliosis. 2. No acute findings. 3. Mild degenerative disc disease.     Electronically Signed   By: Signa Kell M.D.   On: 08/28/2021 10:13   Narrative & Impression  CLINICAL DATA:  35 year old male with low back pain.   EXAM: LUMBAR SPINE - COMPLETE 4+ VIEW    COMPARISON:  None.   FINDINGS: Six lumbar type vertebra. The lowermost vertebra is designated as S1. There is no acute fracture or subluxation of the lumbar spine. Mild scoliosis. The vertebral body heights and disc spaces are maintained. The visualized posterior elements are intact. The soft tissues are unremarkable.   IMPRESSION: No acute/traumatic lumbar spine pathology.     Electronically Signed   By: Elgie Collard M.D.   On: 11/24/2020 21:18       PMFS History: Patient Active Problem List   Diagnosis Date Noted   Bipolar disorder, current episode hypomanic (HCC) 07/01/2021   Bipolar disorder, current episode mixed, mild (HCC) 05/05/2021   Insomnia due to other mental disorder 05/05/2021   Intermittent explosive disorder 04/28/2021   Major depressive disorder, recurrent episode, moderate (HCC) 04/28/2021   Cyst of skin 03/26/2021   Chronic right-sided low back pain with right-sided sciatica 11/24/2020   Generalized anxiety disorder 11/24/2020   Psychophysiological insomnia 11/24/2020   Past Medical History:  Diagnosis Date   Anxiety    Depression     Family History  Family history unknown: Yes    Past Surgical History:  Procedure Laterality Date   NO PAST SURGERIES     Social History   Occupational History   Not on file  Tobacco Use   Smoking status: Never   Smokeless tobacco: Never  Vaping Use   Vaping Use: Every day   Substances: Nicotine, THC  Substance and Sexual Activity   Alcohol use: Yes    Comment: occasionally   Drug use: Yes    Types: Marijuana   Sexual activity: Not on file

## 2021-09-21 ENCOUNTER — Other Ambulatory Visit: Payer: Self-pay

## 2021-09-22 ENCOUNTER — Other Ambulatory Visit: Payer: Self-pay

## 2021-09-29 ENCOUNTER — Ambulatory Visit: Payer: Self-pay | Attending: Family

## 2021-09-29 ENCOUNTER — Other Ambulatory Visit: Payer: Self-pay

## 2021-09-29 DIAGNOSIS — G8929 Other chronic pain: Secondary | ICD-10-CM | POA: Insufficient documentation

## 2021-09-29 DIAGNOSIS — M6281 Muscle weakness (generalized): Secondary | ICD-10-CM | POA: Insufficient documentation

## 2021-09-29 DIAGNOSIS — M5432 Sciatica, left side: Secondary | ICD-10-CM | POA: Insufficient documentation

## 2021-09-29 DIAGNOSIS — M546 Pain in thoracic spine: Secondary | ICD-10-CM | POA: Insufficient documentation

## 2021-09-29 DIAGNOSIS — M545 Low back pain, unspecified: Secondary | ICD-10-CM | POA: Insufficient documentation

## 2021-09-29 DIAGNOSIS — R293 Abnormal posture: Secondary | ICD-10-CM | POA: Insufficient documentation

## 2021-09-29 DIAGNOSIS — S46812D Strain of other muscles, fascia and tendons at shoulder and upper arm level, left arm, subsequent encounter: Secondary | ICD-10-CM | POA: Insufficient documentation

## 2021-09-29 DIAGNOSIS — Z8739 Personal history of other diseases of the musculoskeletal system and connective tissue: Secondary | ICD-10-CM | POA: Insufficient documentation

## 2021-09-29 DIAGNOSIS — M542 Cervicalgia: Secondary | ICD-10-CM | POA: Insufficient documentation

## 2021-09-29 NOTE — Therapy (Signed)
[place], [place], [address] Phone: [phone]   Fax:  [phone]  Physical Therapy Evaluation  Patient Details  Name: Randy Braun MRN: [id number] Date of Birth: 13-Oct-1986 Referring Provider (PT): Durene Fruits NP   Encounter Date: 09/29/2021   PT End of Session - 09/29/21 1323     Visit Number 1    Number of Visits 9    Date for PT Re-Evaluation 11/10/21    Authorization Type CAFA    Authorization Time Period 09/29/21-11/10/21    Progress Note Due on Visit 9    PT Start Time 1225    PT Stop Time 1310    PT Time Calculation (min) 45 min    Activity Tolerance Patient tolerated treatment well    Behavior During Therapy [facility] for tasks assessed/performed             Past Medical History:  Diagnosis Date   Anxiety    Depression     Past Surgical History:  Procedure Laterality Date   NO PAST SURGERIES      There were no vitals filed for this visit.    Subjective Assessment - 09/29/21 1227     Subjective Describes a 6 month history of L sided low back pain and L sciatica.  Reports back pain precedes leg pain, symptoms static but is w/o a car at this point and feels his ympptoms may worsen    Pertinent History 1. Chronic right-sided low back pain with right-sided sciatica         Plan: We discussed the core strengthening and stretching program that he will he can work on.  This is a new job for him and involves a lot more bending turning twisting and previous activities he is done at work.  We reviewed x-rays of his thoracic spine with minimal curvature as well as lumbar spine negative for acute changes.  Patient is neurologically intact.  He can follow-up as needed.    Currently in Pain? Yes    Pain Score 5     Pain Orientation Left    Pain Descriptors / Indicators Shooting    Pain Type Chronic pain    Pain Onset More than a month ago    Pain Frequency Intermittent    Aggravating Factors   weight bearing, bending    Pain Relieving Factors rest and NWB                [facility] PT Assessment - 09/29/21 0001       Assessment   Medical Diagnosis L low back pain/sciatica    Referring Provider (PT) Durene Fruits NP    Hand Dominance Right    Prior Therapy cervical      Precautions   Precautions None      Restrictions   Weight Bearing Restrictions No      Balance Screen   Has the patient fallen in the past 6 months No      Prior Function   Level of Independence Independent    Vocation Full time employment    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      Cognition   Overall Cognitive Status Within Functional Limits for tasks assessed      Sensation   Light Touch Appears Intact      Coordination   Gross Motor Movements are Fluid and Coordinated Yes      Posture/Postural Control   Posture/Postural Control No significant limitations  Palpation   Spinal mobility WFL PA pressure L5-2    SI assessment  potential posterior rotation of Lilium with subsequent leg length discrepancy, (L short)      Special Tests    Special Tests Leg LengthTest;Sacrolliac Tests;Hip Special Tests    Sacroiliac Tests  Pelvic Compression    Leg length test  Apparent    Hip Special Tests  Saralyn Pilar [facility]) Test      Pelvic Compression   Findings Positive    Side Left      Apparent   Comments L leg short/R leg long      Saralyn Pilar (FABER) Test   Findings Positive    Side Left    Comments mild restrictions in mobility      Transfers   Transfers Independent with all Transfers      Ambulation/Gait   Ambulation/Gait Yes    Ambulation/Gait Assistance 7: Independent                        Objective measurements completed on examination: See above findings.                PT Education - 09/29/21 1232     Education Details Disusse eval findings, rehab potential and POC and patient is in agreement    Person(s) Educated Patient    Methods  Explanation    Comprehension Verbalized understanding              PT Short Term Goals - 09/29/21 1339       PT SHORT TERM GOAL #1   Title Independent with initial HEP    Baseline TBD    Time 2    Period Weeks    Status New    Target Date 10/20/21      PT SHORT TERM GOAL #2   Title Patient to demo normalized pelvic alignment/corrected posterior L ilium    Baseline Posterior L ilial rotation and shortened LLE    Time 2    Period Weeks    Status New    Target Date 10/20/21               PT Long Term Goals - 09/29/21 1341       PT LONG TERM GOAL #1   Title Independent with final HEP    Baseline TBD    Time 4    Period Weeks    Status New    Target Date 11/10/21      PT LONG TERM GOAL #2   Title Improve L FABER position to be equal to R    Baseline Mild restrictions at end range    Time 4    Period Weeks    Status New    Target Date 11/10/21      PT LONG TERM GOAL #3   Title Decrease L piriformis irritation to 1/5    Baseline L piriformis irritation 3/5    Time 4    Period Weeks    Status New    Target Date 11/10/21                    Plan - 09/29/21 1332     Clinical Impression Statement Patient referred to OPPT due to 6 month history of L SI pain and subsequent intermittent siatica.  He shows full Lumbar ROM with B hip mobility WNL with mild restrictions during L FABER position.  Assessment of pelvic alignment finds a poterior rotation of  the L ilium with subsequent L leg shortening and limited L SI rotation in forward bending.  MET applied with improved pelvic alignment observed post session.    Personal Factors and Comorbidities Time since onset of injury/illness/exacerbation    Examination-Activity Limitations Lift    Examination-Participation Restrictions Occupation    Stability/Clinical Decision Making Stable/Uncomplicated    Clinical Decision Making Low    Rehab Potential Good    PT Frequency 2x / week    PT Duration 4 weeks     PT Treatment/Interventions ADLs/Self Care Home Management;Aquatic Therapy;Electrical Stimulation;Cryotherapy;Moist Heat;Ultrasound;DME Instruction;Neuromuscular re-education;Balance training;Therapeutic exercise;Therapeutic activities;Functional mobility training;Stair training;Gait training;Patient/family education    PT Next Visit Plan f/u with MET, check pelvic alignment and begin HEP for core and flexiblity    PT Home Exercise Plan TBD based on MET effect    Consulted and Agree with Plan of Care Patient             Patient will benefit from skilled therapeutic intervention in order to improve the following deficits and impairments:  Difficulty walking, Decreased range of motion, Decreased activity tolerance, Pain, Decreased mobility, Postural dysfunction  Visit Diagnosis: Sciatica, left side  Low back pain associated with a spinal disorder other than radiculopathy or spinal stenosis  Muscle weakness (generalized)     Problem List Patient Active Problem List   Diagnosis Date Noted   Bipolar disorder, current episode hypomanic ([place]) 07/01/2021   Bipolar disorder, current episode mixed, mild ([place]) 05/05/2021   Insomnia due to other mental disorder 05/05/2021   Intermittent explosive disorder 04/28/2021   Major depressive disorder, recurrent episode, moderate ([place]) 04/28/2021   Cyst of skin 03/26/2021   Chronic right-sided low back pain with right-sided sciatica 11/24/2020   Generalized anxiety disorder 11/24/2020   Psychophysiological insomnia 11/24/2020    Lanice Shirts, PT 09/29/2021, 1:49 PM  [facility] [address] [place], [place], [address] Phone: [phone]   Fax:  [phone]  Name: Kirill Chatterjee MRN: [id number] Date of Birth: 1986-05-16

## 2021-09-29 NOTE — Patient Instructions (Signed)
HEP TBD following result of MET

## 2021-10-05 ENCOUNTER — Telehealth (HOSPITAL_COMMUNITY): Payer: Self-pay | Admitting: Licensed Clinical Social Worker

## 2021-10-05 NOTE — Telephone Encounter (Signed)
LCSW attempted to call pt to confirm appt for 12/13 with no answer. No VM left.

## 2021-10-06 ENCOUNTER — Other Ambulatory Visit: Payer: Self-pay

## 2021-10-06 ENCOUNTER — Ambulatory Visit: Payer: Self-pay

## 2021-10-06 ENCOUNTER — Ambulatory Visit (INDEPENDENT_AMBULATORY_CARE_PROVIDER_SITE_OTHER): Payer: No Payment, Other | Admitting: Licensed Clinical Social Worker

## 2021-10-06 DIAGNOSIS — F310 Bipolar disorder, current episode hypomanic: Secondary | ICD-10-CM

## 2021-10-06 DIAGNOSIS — M6281 Muscle weakness (generalized): Secondary | ICD-10-CM

## 2021-10-06 DIAGNOSIS — M5432 Sciatica, left side: Secondary | ICD-10-CM

## 2021-10-06 DIAGNOSIS — F411 Generalized anxiety disorder: Secondary | ICD-10-CM

## 2021-10-06 DIAGNOSIS — M545 Low back pain, unspecified: Secondary | ICD-10-CM

## 2021-10-06 DIAGNOSIS — R293 Abnormal posture: Secondary | ICD-10-CM

## 2021-10-06 DIAGNOSIS — F6381 Intermittent explosive disorder: Secondary | ICD-10-CM

## 2021-10-06 DIAGNOSIS — F331 Major depressive disorder, recurrent, moderate: Secondary | ICD-10-CM

## 2021-10-06 NOTE — Therapy (Signed)
[place], [place], [address] Phone: [phone]   Fax:  [phone]  Physical Therapy Treatment  Patient Details  Name: Randy Braun MRN: [id number] Date of Birth: [birth year] Referring Provider (PT): Durene Fruits NP   Encounter Date: 10/06/2021   PT End of Session - 10/06/21 1240     Visit Number 2    Number of Visits 9    Date for PT Re-Evaluation 11/10/21    Authorization Type CAFA    Authorization Time Period 09/29/21-11/10/21    Progress Note Due on Visit 9    PT Start Time 1215    PT Stop Time 1300    PT Time Calculation (min) 45 min    Activity Tolerance Patient tolerated treatment well    Behavior During Therapy [facility] for tasks assessed/performed             Past Medical History:  Diagnosis Date   Anxiety    Depression     Past Surgical History:  Procedure Laterality Date   NO PAST SURGERIES      There were no vitals filed for this visit.   Subjective Assessment - 10/06/21 1216     Subjective Sore from last session session lasting 1-2 days butno overall improvement noted.    Pertinent History 1. Chronic right-sided low back pain with right-sided sciatica         Plan: We discussed the core strengthening and stretching program that he will he can work on.  This is a new job for him and involves a lot more bending turning twisting and previous activities he is done at work.  We reviewed x-rays of his thoracic spine with minimal curvature as well as lumbar spine negative for acute changes.  Patient is neurologically intact.  He can follow-up as needed.    Pain Onset More than a month ago                               [facility] Adult PT Treatment/Exercise - 10/06/21 0001       Transfers   Transfers Independent with all Transfers      Ambulation/Gait   Ambulation/Gait Yes    Ambulation/Gait Assistance 7: Independent      Lumbar Exercises: Stretches   Hip Flexor  Stretch Right;Left;3 reps;30 seconds      Lumbar Exercises: Supine   Bridge Non-compliant;Limitations    Bridge Limitations 30x    Other Supine Lumbar Exercises supine marches, 30x each, alternating      Lumbar Exercises: Sidelying   Clam Both;Limitations    Clam Limitations 30 reps continuous    Hip Abduction Both;Limitations    Hip Abduction Weights (lbs) 30 reps continuous, in 10d hip extension      Manual Therapy   Manual Therapy Soft tissue mobilization    Manual therapy comments TP release to L piriformis, 8 minute duration                       PT Short Term Goals - 10/06/21 1306       PT SHORT TERM GOAL #1   Title Independent with initial HEP    Baseline TBD; 10/06/21 [id number]    Time 2    Period Weeks    Status On-going    Target Date 10/20/21      PT SHORT TERM GOAL #2   Title Patient to demo normalized pelvic  alignment/corrected posterior L ilium    Baseline Posterior L ilial rotation and shortened LLE; 10/06/21 pelvic alignment symmetrical    Time 2    Period Weeks    Status On-going    Target Date 10/20/21               PT Long Term Goals - 09/29/21 1341       PT LONG TERM GOAL #1   Title Independent with final HEP    Baseline TBD    Time 4    Period Weeks    Status New    Target Date 11/10/21      PT LONG TERM GOAL #2   Title Improve L FABER position to be equal to R    Baseline Mild restrictions at end range    Time 4    Period Weeks    Status New    Target Date 11/10/21      PT LONG TERM GOAL #3   Title Decrease L piriformis irritation to 1/5    Baseline L piriformis irritation 3/5    Time 4    Period Weeks    Status New    Target Date 11/10/21                   Plan - 10/06/21 1240     Clinical Impression Statement Todays session focused on B hip and core strengthening.  SI alignment appears symmetric, gross mobility restrictions noted throughout hips and hamstrings.  Marked strength deficits in L hip  abduction noted compared to R.  Initiated B hip strength and flexibility exercises to free-up lumbopelvic soft tissue restrictions.  No leg length discrepancy noted today.  HeEP  issued for strength and flexibility    Personal Factors and Comorbidities Time since onset of injury/illness/exacerbation    Examination-Activity Limitations Lift    Examination-Participation Restrictions Occupation    Stability/Clinical Decision Making Stable/Uncomplicated    Rehab Potential Good    PT Frequency 2x / week    PT Duration 4 weeks    PT Treatment/Interventions ADLs/Self Care Home Management;Aquatic Therapy;Electrical Stimulation;Cryotherapy;Moist Heat;Ultrasound;DME Instruction;Neuromuscular re-education;Balance training;Therapeutic exercise;Therapeutic activities;Functional mobility training;Stair training;Gait training;Patient/family education    PT Next Visit Plan check pelvic alignment and begin HEP for core and flexiblity, assess spinal mobility    PT Home Exercise Plan TBD based on MET effect    Consulted and Agree with Plan of Care Patient             Patient will benefit from skilled therapeutic intervention in order to improve the following deficits and impairments:  Difficulty walking, Decreased range of motion, Decreased activity tolerance, Pain, Decreased mobility, Postural dysfunction  Visit Diagnosis: Sciatica, left side  Low back pain associated with a spinal disorder other than radiculopathy or spinal stenosis  Muscle weakness (generalized)  Abnormal posture     Problem List Patient Active Problem List   Diagnosis Date Noted   Bipolar disorder, current episode hypomanic ([place]) 07/01/2021   Bipolar disorder, current episode mixed, mild ([place]) 05/05/2021   Insomnia due to other mental disorder 05/05/2021   Intermittent explosive disorder 04/28/2021   Major depressive disorder, recurrent episode, moderate ([place]) 04/28/2021   Cyst of skin 03/26/2021   Chronic right-sided low  back pain with right-sided sciatica 11/24/2020   Generalized anxiety disorder 11/24/2020   Psychophysiological insomnia 11/24/2020    Lanice Shirts, PT 10/06/2021, 1:11 PM  [facility] [address] [place], [place], [address] Phone: [phone]  Fax:  [phone]  Name: Randy Braun MRN: [id number] Date of Birth: [birth year]

## 2021-10-06 NOTE — Progress Notes (Signed)
° °  THERAPIST PROGRESS NOTE  Session Time: 89  Participation Level: Active  Behavioral Response: CasualAlertAnxious and Depressed  Type of Therapy: Individual Therapy  Treatment Goals addressed: Anger, Anxiety, Coping, and Diagnosis: depression   Interventions: CBT, Motivational Interviewing, and Supportive  Summary: Randy Braun is a 35 y.o. male who presents with depressed, anxious, irritable mood\affect.  Patient was pleasant, cooperative, maintains good eye contact.  He was alert and oriented x5.  Patient was dressed casually and engaged well in therapy session.  Primary stressor for patient as financials and work.  Patient reports that he has been experiencing symptoms for depression, anxiety, and hypomania.  Patient reports irritability, restlessness, tension, racing thoughts, hopelessness, and insomnia.  Patient reports that he comes in today as a referral from his peers support specialist at  [facility] due to his increase in depression and anxiety.  Patient reports that he has been struggling financially due to recently quitting his job at a pond [employer] about 60 to 90 days ago.  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  Current income is about $600 per month.  Patient reports to me for started he was not getting enough hours and needed to sell his stocks and cryptocurrency's to manage his bills which increases depression and anxiety.    Suicidal/Homicidal: Nowithout intent/plan  Therapist Response:    Intervention/Plan: LCSW utilized supportive, person centered, and cognitive behavioral therapy.  LCSW helped patient with budgeting out a plan by going on  Monthly bills which he reports has $321 per month minus the $600 that he currently makes patient has about $280 a free income.  Patient reports that about 160 of it is going towards fast food although he reports receiving food stamps.  Patient to decrease  going out by 50% down to $80 per month.  LCSW utilized language for empowerment, encouragement, and positive affirmations in today's session  Plan: Return again in 4 weeks.    Weber Cooks, LCSW 10/06/2021

## 2021-10-06 NOTE — Patient Instructions (Signed)
Access Code: [email or web address] URL: [email or web address] Date: 10/06/2021 Prepared by: Gustavus Bryant  Exercises Supine Bridge - 2 x daily - 7 x weekly - 1 sets - 30 reps Clamshell - 2 x daily - 7 x weekly - 1 sets - 30 reps Sidelying Hip Abduction - 2 x daily - 7 x weekly - 1 sets - 30 reps Hip Flexor Stretch at Edge of Bed - 2 x daily - 7 x weekly - 1 sets - 3 reps - 30s hold Supine March - 2 x daily - 7 x weekly - 1 sets - 30 reps

## 2021-10-08 ENCOUNTER — Other Ambulatory Visit: Payer: Self-pay

## 2021-10-09 ENCOUNTER — Other Ambulatory Visit: Payer: Self-pay

## 2021-10-13 ENCOUNTER — Ambulatory Visit: Payer: Self-pay

## 2021-10-15 ENCOUNTER — Other Ambulatory Visit: Payer: Self-pay

## 2021-10-15 ENCOUNTER — Ambulatory Visit: Payer: Self-pay

## 2021-10-15 DIAGNOSIS — M6281 Muscle weakness (generalized): Secondary | ICD-10-CM

## 2021-10-15 DIAGNOSIS — M5432 Sciatica, left side: Secondary | ICD-10-CM

## 2021-10-15 DIAGNOSIS — M545 Low back pain, unspecified: Secondary | ICD-10-CM

## 2021-10-15 DIAGNOSIS — R293 Abnormal posture: Secondary | ICD-10-CM

## 2021-10-15 NOTE — Patient Instructions (Signed)
Access Code: [email or web address] URL: [email or web address] Date: 10/15/2021 Prepared by: Gustavus Bryant  Exercises Supine Transversus Abdominis Bracing - Hands on Stomach - 2 x daily - 7 x weekly - 2 sets - 15 reps Supine Piriformis Stretch with Foot on Ground - 2 x daily - 7 x weekly - 1 sets - 3 reps - 30s hold Figure 4 Bridge - 2 x daily - 7 x weekly - 2 sets - 15 reps Supine Lower Trunk Rotation - 2 x daily - 7 x weekly - 1 sets - 3 reps - 30s hold

## 2021-10-15 NOTE — Therapy (Signed)
[facility] [address] [place], [place], [address] Phone: [phone]   Fax:  [phone]  Physical Therapy Treatment  Patient Details  Name: Randy Braun MRN: [id number] Date of Birth: 04/01/1986 Referring Provider (PT): Ricky Stabs NP   Encounter Date: 10/15/2021   PT End of Session - 10/15/21 1207     Visit Number 3    Number of Visits 9    Date for PT Re-Evaluation 11/10/21    Authorization Type CAFA    Authorization Time Period 09/29/21-11/10/21    Progress Note Due on Visit 9    PT Start Time 1215    PT Stop Time 1300    PT Time Calculation (min) 45 min    Activity Tolerance Patient tolerated treatment well    Behavior During Therapy [facility] for tasks assessed/performed             Past Medical History:  Diagnosis Date   Anxiety    Depression     Past Surgical History:  Procedure Laterality Date   NO PAST SURGERIES      There were no vitals filed for this visit.   Subjective Assessment - 10/15/21 1215     Subjective Doing well up until Sunday when he developed central soreness in LS region, denies radicular symoptoms    Pertinent History 1. Chronic right-sided low back pain with right-sided sciatica         Plan: We discussed the core strengthening and stretching program that he will he can work on.  This is a new job for him and involves a lot more bending turning twisting and previous activities he is done at work.  We reviewed x-rays of his thoracic spine with minimal curvature as well as lumbar spine negative for acute changes.  Patient is neurologically intact.  He can follow-up as needed.    Pain Onset More than a month ago            [facility] Adult PT Treatment:     DATE: 10/15/21 Therapeutic Exercise: Fig 4 piriformis stretch , 30s x3 Single leg bridge, B, 15x2 LTR 30s B, 30s x3 Supine DKTC with curl up 15x2 using black physioball Introduced seated core exercises of hip tosses, shoulder  tosses, chops and Victories, 10 reps with blue weighted ball followed by latissimus pushdowns with inspiration on active press   L hip abduction in extension 2x15 Manual Therapy: N/a Neuromuscular re-ed: N/a Therapeutic Activity: N/a Modalities: N/a Self Care: Access Code: [email or web address] URL: [email or web address] Date: 10/15/2021 Prepared by: Gustavus Bryant  Exercises Supine Transversus Abdominis Bracing - Hands on Stomach - 2 x daily - 7 x weekly - 2 sets - 15 reps Supine Piriformis Stretch with Foot on Ground - 2 x daily - 7 x weekly - 1 sets - 3 reps - 30s hold Figure 4 Bridge - 2 x daily - 7 x weekly - 2 sets - 15 reps Supine Lower Trunk Rotation - 2 x daily - 7 x weekly - 1 sets - 3 reps - 30s hold                                PT Short Term Goals - 10/15/21 1313       PT SHORT TERM GOAL #1   Title Independent with initial HEP    Baseline TBD; 10/06/21 [id number], 10/15/21  [id number]    Time 2    Period Tania Ade  Status On-going    Target Date 10/20/21      PT SHORT TERM GOAL #2   Title Patient to demo normalized pelvic alignment/corrected posterior L ilium    Baseline Posterior L ilial rotation and shortened LLE; 10/06/21 pelvic alignment symmetrical    Time 2    Period Weeks    Status Achieved    Target Date 10/20/21               PT Long Term Goals - 10/15/21 1314       PT LONG TERM GOAL #1   Title Independent with final HEP    Baseline [id number]    Time 4    Period Weeks    Status New    Target Date 11/10/21      PT LONG TERM GOAL #2   Title Improve L FABER position to be equal to R    Baseline Mild restrictions at end range    Time 4    Period Weeks    Status New    Target Date 11/10/21      PT LONG TERM GOAL #3   Title Decrease L piriformis irritation to 1/5    Baseline L piriformis irritation 3/5    Time 4    Period Weeks    Status New    Target Date 11/10/21                   Plan -  10/15/21 1308     Clinical Impression Statement Pelvic alignment symmetrical, no L piriformis tenderness to address.  Began stretching and core strengthening with decreased symptoms reported following stretch and strengthening.  Symptoms remain centralized and appear musculoskeletal in nature. L hip strength still deficit in abduction with core wewakness also noted.    Personal Factors and Comorbidities Time since onset of injury/illness/exacerbation    Examination-Activity Limitations Lift    Examination-Participation Restrictions Occupation    Stability/Clinical Decision Making Stable/Uncomplicated    Rehab Potential Good    PT Frequency 2x / week    PT Duration 4 weeks    PT Treatment/Interventions ADLs/Self Care Home Management;Aquatic Therapy;Electrical Stimulation;Cryotherapy;Moist Heat;Ultrasound;DME Instruction;Neuromuscular re-education;Balance training;Therapeutic exercise;Therapeutic activities;Functional mobility training;Stair training;Gait training;Patient/family education    PT Next Visit Plan HEP for core and flexiblity, continiue advanced core strengthening, tall kneel tasks    PT Home Exercise Plan [email or web address]    Consulted and Agree with Plan of Care Patient             Patient will benefit from skilled therapeutic intervention in order to improve the following deficits and impairments:  Difficulty walking, Decreased range of motion, Decreased activity tolerance, Pain, Decreased mobility, Postural dysfunction  Visit Diagnosis: Sciatica, left side  Muscle weakness (generalized)  Low back pain associated with a spinal disorder other than radiculopathy or spinal stenosis  Abnormal posture     Problem List Patient Active Problem List   Diagnosis Date Noted   Bipolar disorder, current episode hypomanic ([facility]) 07/01/2021   Bipolar disorder, current episode mixed, mild ([facility]) 05/05/2021   Insomnia due to other mental disorder 05/05/2021   Intermittent explosive disorder  04/28/2021   Major depressive disorder, recurrent episode, moderate ([facility]) 04/28/2021   Cyst of skin 03/26/2021   Chronic right-sided low back pain with right-sided sciatica 11/24/2020   Generalized anxiety disorder 11/24/2020   Psychophysiological insomnia 11/24/2020    Hildred Laser, PT 10/15/2021, 1:16 PM  [facility] [address] [place], [place], [address]  Phone: [phone]   Fax:  [phone]  Name: Levar Fayson MRN: [id number] Date of Birth: July 08, 1986

## 2021-10-21 ENCOUNTER — Other Ambulatory Visit: Payer: Self-pay

## 2021-10-22 ENCOUNTER — Other Ambulatory Visit: Payer: Self-pay

## 2021-10-27 ENCOUNTER — Other Ambulatory Visit: Payer: Self-pay

## 2021-10-27 ENCOUNTER — Ambulatory Visit: Payer: Self-pay | Attending: Family

## 2021-10-27 DIAGNOSIS — M5432 Sciatica, left side: Secondary | ICD-10-CM | POA: Insufficient documentation

## 2021-10-27 DIAGNOSIS — M6281 Muscle weakness (generalized): Secondary | ICD-10-CM | POA: Insufficient documentation

## 2021-10-27 DIAGNOSIS — M545 Low back pain, unspecified: Secondary | ICD-10-CM | POA: Insufficient documentation

## 2021-10-27 NOTE — Therapy (Signed)
[name], [place], [address] Phone: [phone]   Fax:  [phone]  Physical Therapy Treatment  Patient Details  Name: Randy Braun MRN: [id number] Date of Birth: 1986/09/18 Referring Provider (PT): Durene Fruits NP   Encounter Date: 10/27/2021   PT End of Session - 10/27/21 1203     Visit Number 4    Number of Visits 9    Date for PT Re-Evaluation 11/10/21    Authorization Type CAFA    Authorization Time Period 09/29/21-11/10/21    Progress Note Due on Visit 9    PT Start Time 1215    PT Stop Time 1300    PT Time Calculation (min) 45 min    Activity Tolerance Patient tolerated treatment well    Behavior During Therapy [facility] for tasks assessed/performed             Past Medical History:  Diagnosis Date   Anxiety    Depression     Past Surgical History:  Procedure Laterality Date   NO PAST SURGERIES      There were no vitals filed for this visit.   Subjective Assessment - 10/27/21 1220     Subjective Reports improvement. now able to ambulate 15 min before symptoms onset    Pertinent History 1. Chronic right-sided low back pain with right-sided sciatica         Plan: We discussed the core strengthening and stretching program that he will he can work on.  This is a new job for him and involves a lot more bending turning twisting and previous activities he is done at work.  We reviewed x-rays of his thoracic spine with minimal curvature as well as lumbar spine negative for acute changes.  Patient is neurologically intact.  He can follow-up as needed.    Pain Onset More than a month ago              [facility] Adult PT Treatment:     DATE: 10/27/21 Therapeutic Exercise: Fig 4 piriformis stretch , 30s x2 Pelvic tilts 10x 3s hold Single leg bridge, B, 15x2 Nustep L4 seat 10, arms 10, 6 min Tall knee core exercises of hip tosses, shoulder tosses, chops and Victories, 10 reps with blue weighted  ball followed by latissimus pushdowns with inspiration on active press   B hip abduction in extension 30x Tall kneel with OH reach with blue weighted ball, 15x2 Standing hip flexor stretch, 30s hold, 3x B Manual Therapy: N/a Neuromuscular re-ed: N/a Therapeutic Activity: N/a Modalities: N/a Self Care: Access Code: [email or web address] URL: [email or web address] Date: 10/15/2021 Prepared by: Sharlynn Oliphant   Exercises Supine Transversus Abdominis Bracing - Hands on Stomach - 2 x daily - 7 x weekly - 2 sets - 15 reps Supine Piriformis Stretch with Foot on Ground - 2 x daily - 7 x weekly - 1 sets - 3 reps - 30s hold Figure 4 Bridge - 2 x daily - 7 x weekly - 2 sets - 15 reps Supine Lower Trunk Rotation - 2 x daily - 7 x weekly - 1 sets - 3 reps - 30s hold     PT Short Term Goals - 10/27/21 1231       PT SHORT TERM GOAL #1   Title Independent with initial HEP    Baseline TBD; 10/06/21 [id number], 10/15/21  [id number], 10/27/21 Reports compliance, demos good form with review    Time 2    Period Weeks    Status Achieved  Target Date 10/20/21      PT SHORT TERM GOAL #2   Title Patient to demo normalized pelvic alignment/corrected posterior L ilium    Baseline Posterior L ilial rotation and shortened LLE; 10/06/21 pelvic alignment symmetrical    Time 2    Period Weeks    Status Achieved    Target Date 10/20/21               PT Long Term Goals - 10/15/21 1314       PT LONG TERM GOAL #1   Title Independent with final HEP    Baseline [id number]    Time 4    Period Weeks    Status New    Target Date 11/10/21      PT LONG TERM GOAL #2   Title Improve L FABER position to be equal to R    Baseline Mild restrictions at end range    Time 4    Period Weeks    Status New    Target Date 11/10/21      PT LONG TERM GOAL #3   Title Decrease L piriformis irritation to 1/5    Baseline L piriformis irritation 3/5    Time 4    Period Weeks    Status New    Target Date  11/10/21                   Plan - 10/27/21 1223     Clinical Impression Statement Begins session with good pelvic alignment, continued to emphasize stretching and core strengthening, piiriformis stretch especially tight on L.  STGs met.  Advanced to tall kneel tasks with weighted ball resistance.  Less weakness cited on L side vs R    Personal Factors and Comorbidities Time since onset of injury/illness/exacerbation    Examination-Activity Limitations Lift    Examination-Participation Restrictions Occupation    Stability/Clinical Decision Making Stable/Uncomplicated    Rehab Potential Good    PT Frequency 2x / week    PT Duration 4 weeks    PT Treatment/Interventions ADLs/Self Care Home Management;Aquatic Therapy;Electrical Stimulation;Cryotherapy;Moist Heat;Ultrasound;DME Instruction;Neuromuscular re-education;Balance training;Therapeutic exercise;Therapeutic activities;Functional mobility training;Stair training;Gait training;Patient/family education    PT Next Visit Plan tasks for core sterngth and flexiblity, continiue advanced core strengthening, tall kneel tasks    PT [place] and Agree with Plan of Care Patient             Patient will benefit from skilled therapeutic intervention in order to improve the following deficits and impairments:  Difficulty walking, Decreased range of motion, Decreased activity tolerance, Pain, Decreased mobility, Postural dysfunction  Visit Diagnosis: Sciatica, left side  Muscle weakness (generalized)  Low back pain associated with a spinal disorder other than radiculopathy or spinal stenosis     Problem List Patient Active Problem List   Diagnosis Date Noted   Bipolar disorder, current episode hypomanic ([place]) 07/01/2021   Bipolar disorder, current episode mixed, mild ([place]) 05/05/2021   Insomnia due to other mental disorder 05/05/2021   Intermittent explosive disorder 04/28/2021   Major depressive  disorder, recurrent episode, moderate ([place]) 04/28/2021   Cyst of skin 03/26/2021   Chronic right-sided low back pain with right-sided sciatica 11/24/2020   Generalized anxiety disorder 11/24/2020   Psychophysiological insomnia 11/24/2020    Lanice Shirts, PT 10/27/2021, 12:56 PM  [place] [facility] [address] [place], [place], [address] Phone: [phone]   Fax:  [phone]  Name: [phone]  Randy Braun MRN: [id number] Date of Birth: [birth year]

## 2021-10-29 ENCOUNTER — Ambulatory Visit: Payer: Self-pay

## 2021-10-29 ENCOUNTER — Other Ambulatory Visit: Payer: Self-pay

## 2021-10-29 DIAGNOSIS — M6281 Muscle weakness (generalized): Secondary | ICD-10-CM

## 2021-10-29 DIAGNOSIS — M545 Low back pain, unspecified: Secondary | ICD-10-CM

## 2021-10-29 DIAGNOSIS — M5432 Sciatica, left side: Secondary | ICD-10-CM

## 2021-10-29 NOTE — Patient Instructions (Signed)
Access Code: [email or web address] URL: [email or web address] Date: 10/29/2021 Prepared by: Gustavus Bryant  Exercises Supine Transversus Abdominis Bracing - Hands on Stomach - 2 x daily - 7 x weekly - 2 sets - 15 reps Single Leg Bridge - 2 x daily - 7 x weekly - 2 sets - 15 reps Supine March - 2 x daily - 7 x weekly - 2 sets - 15 reps Dead Bug Alternating Arm Extension - 2 x daily - 7 x weekly - 2 sets - 15 reps Prone Press Up - 2 x daily - 7 x weekly - 1 sets - 10 reps - 3s hold

## 2021-10-29 NOTE — Therapy (Signed)
[place] [facility] [place], [place], [address] Phone: [phone]   Fax:  [phone]  Physical Therapy Treatment  Patient Details  Name: Randy Braun MRN: [id number] Date of Birth: 1986/08/06 Referring Provider (PT): Durene Fruits NP   Encounter Date: 10/29/2021   PT End of Session - 10/29/21 1209     Visit Number 5    Number of Visits 9    Date for PT Re-Evaluation 11/10/21    Authorization Type CAFA    Authorization Time Period 09/29/21-11/10/21    Progress Note Due on Visit 9    PT Start Time 1215    PT Stop Time 1300    PT Time Calculation (min) 45 min    Activity Tolerance Patient tolerated treatment well    Behavior During Therapy [facility] for tasks assessed/performed             Past Medical History:  Diagnosis Date   Anxiety    Depression     Past Surgical History:  Procedure Laterality Date   NO PAST SURGERIES      There were no vitals filed for this visit.   Subjective Assessment - 10/29/21 1213     Subjective Feels about the same, pain localized to L lumbo sacral region    Pertinent History 1. Chronic right-sided low back pain with right-sided sciatica         Plan: We discussed the core strengthening and stretching program that he will he can work on.  This is a new job for him and involves a lot more bending turning twisting and previous activities he is done at work.  We reviewed x-rays of his thoracic spine with minimal curvature as well as lumbar spine negative for acute changes.  Patient is neurologically intact.  He can follow-up as needed.    Pain Onset More than a month ago             [facility] Adult PT Treatment:                                                DATE: 10/29/21 Therapeutic Exercise: Nustep seat 9, arms 8, L4 8 min Therapeutic Activities: Curl ups 2x15 PT with alt. Marching 15x2 PT with alt. OH flexion 15x2 Prone press x10 Bridge march 15x2 Tall kneel core exercises of hip  tosses, shoulder tosses, chops and Victories, 12 reps with blue weighted ball  Tall knee hip hinge with OH ball reach 15x using blue ball B FABERS equal and asymptomatic    Date 10/27/21 Therapeutic Exercise:   Fig 4 piriformis stretch , 30s x2 Pelvic tilts 10x 3s hold Single leg bridge, B, 15x2 Nustep L4 seat 10, arms 10, 6 min Tall knee core exercises of hip tosses, shoulder tosses, chops and Victories, 10 reps with blue weighted ball followed by latissimus pushdowns with inspiration on active press   B hip abduction in extension 30x Tall kneel with OH reach with blue weighted ball, 15x2 Standing hip flexor stretch, 30s hold, 3x B      PT Short Term Goals - 10/27/21 1231       PT SHORT TERM GOAL #1   Title Independent with initial HEP    Baseline TBD; 10/06/21 [id number], 10/15/21  [id number], 10/27/21 Reports compliance, demos good form with review    Time 2  Period Weeks    Status Achieved    Target Date 10/20/21      PT SHORT TERM GOAL #2   Title Patient to demo normalized pelvic alignment/corrected posterior L ilium    Baseline Posterior L ilial rotation and shortened LLE; 10/06/21 pelvic alignment symmetrical    Time 2    Period Weeks    Status Achieved    Target Date 10/20/21               PT Long Term Goals - 10/15/21 1314       PT LONG TERM GOAL #1   Title Independent with final HEP    Baseline [id number]    Time 4    Period Weeks    Status New    Target Date 11/10/21      PT LONG TERM GOAL #2   Title Improve L FABER position to be equal to R    Baseline Mild restrictions at end range    Time 4    Period Weeks    Status New    Target Date 11/10/21      PT LONG TERM GOAL #3   Title Decrease L piriformis irritation to 1/5    Baseline L piriformis irritation 3/5    Time 4    Period Weeks    Status New    Target Date 11/10/21                  Patient will benefit from skilled therapeutic intervention in order to improve the following  deficits and impairments:     Visit Diagnosis: Sciatica, left side  Muscle weakness (generalized)  Low back pain associated with a spinal disorder other than radiculopathy or spinal stenosis     Problem List Patient Active Problem List   Diagnosis Date Noted   Bipolar disorder, current episode hypomanic ([place]) 07/01/2021   Bipolar disorder, current episode mixed, mild ([place]) 05/05/2021   Insomnia due to other mental disorder 05/05/2021   Intermittent explosive disorder 04/28/2021   Major depressive disorder, recurrent episode, moderate ([place]) 04/28/2021   Cyst of skin 03/26/2021   Chronic right-sided low back pain with right-sided sciatica 11/24/2020   Generalized anxiety disorder 11/24/2020   Psychophysiological insomnia 11/24/2020    Lanice Shirts, PT 10/29/2021, 12:27 PM  [place] [facility] [address] [place], [place], [address] Phone: [phone]   Fax:  [phone]  Name: Chriss Bussman MRN: [id number] Date of Birth: Aug 21, 1986

## 2021-11-03 ENCOUNTER — Ambulatory Visit: Payer: Self-pay

## 2021-11-03 ENCOUNTER — Other Ambulatory Visit: Payer: Self-pay

## 2021-11-03 DIAGNOSIS — M5432 Sciatica, left side: Secondary | ICD-10-CM

## 2021-11-03 DIAGNOSIS — M6281 Muscle weakness (generalized): Secondary | ICD-10-CM

## 2021-11-03 DIAGNOSIS — M545 Low back pain, unspecified: Secondary | ICD-10-CM

## 2021-11-03 NOTE — Therapy (Signed)
[facility] [address] [place], [place], [address] Phone: [phone]   Fax:  [phone]  Physical Therapy Treatment  Patient Details  Name: Randy Braun MRN: [id number] Date of Birth: [birth year] Referring Provider (PT): Ricky Stabs NP   Encounter Date: 11/03/2021   PT End of Session - 11/03/21 1448     Visit Number 6    Number of Visits 9    Date for PT Re-Evaluation 11/10/21    Authorization Type CAFA    Authorization Time Period 09/29/21-11/10/21    Progress Note Due on Visit 9    PT Start Time 1445    PT Stop Time 1530    PT Time Calculation (min) 45 min    Activity Tolerance Patient tolerated treatment well    Behavior During Therapy [facility] for tasks assessed/performed             Past Medical History:  Diagnosis Date   Anxiety    Depression     Past Surgical History:  Procedure Laterality Date   NO PAST SURGERIES      There were no vitals filed for this visit.    Subjective Assessment - 11/03/21 1534     Subjective discouraged as he was doing well but had to walk extended distance and symptoms ensued.    Pertinent History 1. Chronic right-sided low back pain with right-sided sciatica         Plan: We discussed the core strengthening and stretching program that he will he can work on.  This is a new job for him and involves a lot more bending turning twisting and previous activities he is done at work.  We reviewed x-rays of his thoracic spine with minimal curvature as well as lumbar spine negative for acute changes.  Patient is neurologically intact.  He can follow-up as needed.    Pain Onset More than a month ago           [facility] Adult PT Treatment:                                                DATE: 11/03/21 Therapeutic Exercise: Treadmill 8 min 2.0 MPH to try to replicate pain Prone press x10 Curl ups x 30 Prone L fig 4 mobilization of trunk. Trunk mobility WNL and painfree, no pelvic alignment  issued noted, palpation unremarkable, able to reproduce symptoms with 8 minute walk on traedmill, relived discomfort withself mobilization of prone L figure 4 and L trunk rotation, noting an audible "pop" and relief of symptoms     [facility] Adult PT Treatment:                                                DATE: 10/29/21 Therapeutic Exercise: Nustep seat 9, arms 8, L4 8 min Therapeutic Activities: Curl ups 2x15 PT with alt. Marching 15x2 PT with alt. OH flexion 15x2 Prone press x10 Bridge march 15x2 Tall kneel core exercises of hip tosses, shoulder tosses, chops and Victories, 12 reps with blue weighted ball  Tall knee hip hinge with OH ball reach 15x using blue ball B FABERS equal and asymptomatic      PT Short Term Goals - 10/27/21 1231  PT SHORT TERM GOAL #1   Title Independent with initial HEP    Baseline TBD; 10/06/21 [id number], 10/15/21  [id number], 10/27/21 Reports compliance, demos good form with review    Time 2    Period Weeks    Status Achieved    Target Date 10/20/21      PT SHORT TERM GOAL #2   Title Patient to demo normalized pelvic alignment/corrected posterior L ilium    Baseline Posterior L ilial rotation and shortened LLE; 10/06/21 pelvic alignment symmetrical    Time 2    Period Weeks    Status Achieved    Target Date 10/20/21               PT Long Term Goals - 10/29/21 1307       PT LONG TERM GOAL #1   Title Independent with final HEP    Baseline [id number]    Time 4    Period Weeks    Status New    Target Date 11/10/21      PT LONG TERM GOAL #2   Title Improve L FABER position to be equal to R    Baseline Mild restrictions at end range 10/29/21 FABERs equal and asymptomatic B    Time 4    Period Weeks    Status Achieved    Target Date 11/10/21      PT LONG TERM GOAL #3   Title Decrease L piriformis irritation to 1/5    Baseline L piriformis irritation 3/5    Time 4    Period Weeks    Status New    Target Date 11/10/21                    Plan - 11/03/21 1537     Clinical Impression Statement Trunk mobility WNL and painfree, no pelvic alignment issued noted, palpation unremarkable, able to reproduce symptoms with 8 minute walk on traedmill, relived discomfort withself mobilization of prone L figure 4 and L trunk rotation, noting an audible "pop" and relief of symptoms    Personal Factors and Comorbidities Time since onset of injury/illness/exacerbation    Examination-Activity Limitations Lift    Examination-Participation Restrictions Occupation    Stability/Clinical Decision Making Stable/Uncomplicated    Rehab Potential Good    PT Frequency 2x / week    PT Duration 4 weeks    PT Treatment/Interventions ADLs/Self Care Home Management;Aquatic Therapy;Electrical Stimulation;Cryotherapy;Moist Heat;Ultrasound;DME Instruction;Neuromuscular re-education;Balance training;Therapeutic exercise;Therapeutic activities;Functional mobility training;Stair training;Gait training;Patient/family education    PT Next Visit Plan Assess ability to self mob and control symptoms, consider DC if able to self manage    PT Home Exercise Plan [id number]    Consulted and Agree with Plan of Care Patient             Patient will benefit from skilled therapeutic intervention in order to improve the following deficits and impairments:  Difficulty walking, Decreased range of motion, Decreased activity tolerance, Pain, Decreased mobility, Postural dysfunction  Visit Diagnosis: Sciatica, left side  Low back pain associated with a spinal disorder other than radiculopathy or spinal stenosis  Muscle weakness (generalized)     Problem List Patient Active Problem List   Diagnosis Date Noted   Bipolar disorder, current episode hypomanic ([facility]) 07/01/2021   Bipolar disorder, current episode mixed, mild ([facility]) 05/05/2021   Insomnia due to other mental disorder 05/05/2021   Intermittent explosive disorder 04/28/2021   Major depressive  disorder, recurrent episode, moderate ([facility]) 04/28/2021   Cyst of  skin 03/26/2021   Chronic right-sided low back pain with right-sided sciatica 11/24/2020   Generalized anxiety disorder 11/24/2020   Psychophysiological insomnia 11/24/2020    Hildred Laser, PT 11/03/2021, 3:45 PM  [facility] [address] [place], [place], [address] Phone: [phone]   Fax:  [phone]  Name: Randy Braun MRN: [id number] Date of Birth: [birth year]

## 2021-11-05 ENCOUNTER — Telehealth (INDEPENDENT_AMBULATORY_CARE_PROVIDER_SITE_OTHER): Payer: No Payment, Other | Admitting: Physician Assistant

## 2021-11-05 ENCOUNTER — Ambulatory Visit: Payer: Self-pay

## 2021-11-05 ENCOUNTER — Other Ambulatory Visit: Payer: Self-pay

## 2021-11-05 DIAGNOSIS — M6281 Muscle weakness (generalized): Secondary | ICD-10-CM

## 2021-11-05 DIAGNOSIS — F411 Generalized anxiety disorder: Secondary | ICD-10-CM | POA: Diagnosis not present

## 2021-11-05 DIAGNOSIS — F5104 Psychophysiologic insomnia: Secondary | ICD-10-CM

## 2021-11-05 DIAGNOSIS — F3161 Bipolar disorder, current episode mixed, mild: Secondary | ICD-10-CM

## 2021-11-05 DIAGNOSIS — M545 Low back pain, unspecified: Secondary | ICD-10-CM

## 2021-11-05 DIAGNOSIS — M5432 Sciatica, left side: Secondary | ICD-10-CM

## 2021-11-05 MED ORDER — TRAZODONE HCL 50 MG PO TABS
50.0000 mg | ORAL_TABLET | Freq: Every day | ORAL | 1 refills | Status: DC
  Filled 2021-11-05: qty 30, 30d supply, fill #0
  Filled 2021-12-17: qty 30, 30d supply, fill #1

## 2021-11-05 MED ORDER — ARIPIPRAZOLE 5 MG PO TABS
5.0000 mg | ORAL_TABLET | Freq: Every day | ORAL | 1 refills | Status: DC
  Filled 2021-11-05 – 2021-11-18 (×2): qty 30, 30d supply, fill #0
  Filled 2021-12-17: qty 30, 30d supply, fill #1

## 2021-11-05 NOTE — Progress Notes (Signed)
BH MD/PA/NP OP Progress Note  Virtual Visit via Telephone Note  I connected with Randy Braun on 11/05/21 at  3:00 PM EST by telephone and verified that I am speaking with the correct person using two identifiers.  Location: Patient: Home Provider: Clinic   I discussed the limitations, risks, security and privacy concerns of performing an evaluation and management service by telephone and the availability of in person appointments. I also discussed with the patient that there may be a patient responsible charge related to this service. The patient expressed understanding and agreed to proceed.  Follow Up Instructions:  I discussed the assessment and treatment plan with the patient. The patient was provided an opportunity to ask questions and all were answered. The patient agreed with the plan and demonstrated an understanding of the instructions.   The patient was advised to call back or seek an in-person evaluation if the symptoms worsen or if the condition fails to improve as anticipated.  I provided 24 minutes of non-face-to-face time during this encounter.  Meta HatchetUchenna E Conchita Truxillo, PA    11/05/2021 4:34 PM Randy CourserDinavian Delfino  MRN:  [id number]  Chief Complaint: Follow up and medication management  HPI:   Randy Braun is a 36 year old male with a past psychiatric history significant for bipolar disorder, anxiety, and insomnia [facility] via virtual telephone visit for follow-up and medication management.  Patient is currently being managed on the following medications:  Hydroxyzine 10 mg 3 times daily as needed Abilify 5 mg daily Trazodone 50 mg at bedtime  Patient endorses some days of experiencing low mood.  He reports that his sleep is irregular but states that his trazodone makes him feel groggy when taking it at night.  He attributes his sleep irregularity to going to bed later and waking up early.  He also states that by  night fall, he feels more wired.  For the most part, patient states that he is able to perform his activities of daily living but states that he often thinks about stressors in his life.  A PHQ-9 screen was performed with the patient scoring a 7.  A GAD-7 screen was also performed with the patient scoring at 8.  Patient is alert and oriented x4, calm, cooperative, and fully engaged in conversation during the encounter.  Patient endorses anxious mood.  Patient denies suicidal or homicidal ideations.  He further denies auditory or visual hallucinations and does not appear to be responding to internal/external stimuli.  Patient endorses receiving on average roughly 6 hours of sleep each night.  Patient endorses good appetite and eats on average 3-4 meals per day.  Patient notes that he has been eating more sweets lately.  Patient endorses occasional alcohol consumption.  He denies tobacco use but states that he does engage in vaping.  Patient endorses illicit drug use in the form of marijuana.  Visit Diagnosis:    ICD-10-CM   1. Bipolar disorder, current episode mixed, mild (HCC)  F31.61 ARIPiprazole (ABILIFY) 5 MG tablet    2. Anxiety state  F41.1     3. Psychophysiological insomnia  F51.04 traZODone (DESYREL) 50 MG tablet      Past Psychiatric History:  Bipolar disorder Insomnia Anxiety  Past Medical History:  Past Medical History:  Diagnosis Date   Anxiety    Depression     Past Surgical History:  Procedure Laterality Date   NO PAST SURGERIES      Family Psychiatric History:  Patient  is unsure of family psychiatric history  Family History:  Family History  Family history unknown: Yes    Social History:  Social History   Socioeconomic History   Marital status: Single    Spouse name: Not on file   Number of children: Not on file   Years of education: Not on file   Highest education level: Not on file  Occupational History   Not on file  Tobacco Use   Smoking status:  Never   Smokeless tobacco: Never  Vaping Use   Vaping Use: Every day   Substances: Nicotine, THC  Substance and Sexual Activity   Alcohol use: Yes    Comment: occasionally   Drug use: Yes    Types: Marijuana   Sexual activity: Not on file  Other Topics Concern   Not on file  Social History Narrative   Not on file   Social Determinants of Health   Financial Resource Strain: Medium Risk   Difficulty of Paying Living Expenses: Somewhat hard  Food Insecurity: No Food Insecurity   Worried About Running Out of Food in the Last Year: Never true   Ran Out of Food in the Last Year: Never true  Transportation Needs: No Transportation Needs   Lack of Transportation (Medical): No   Lack of Transportation (Non-Medical): No  Physical Activity: Inactive   Days of Exercise per Week: 0 days   Minutes of Exercise per Session: 0 min  Stress: Stress Concern Present   Feeling of Stress : To some extent  Social Connections: Socially Isolated   Frequency of Communication with Friends and Family: More than three times a week   Frequency of Social Gatherings with Friends and Family: Once a week   Attends Religious Services: Never   Database administrator or Organizations: No   Attends Banker Meetings: Never   Marital Status: Never married    Allergies:  Allergies  Allergen Reactions   Vinegar [Acetic Acid] Other (See Comments)    Red Vinegar- flush    Metabolic Disorder Labs: Lab Results  Component Value Date   HGBA1C 5.8 (H) 02/27/2021   No results found for: PROLACTIN Lab Results  Component Value Date   CHOL 134 02/27/2021   TRIG 57 02/27/2021   HDL 55 02/27/2021   CHOLHDL 2.4 02/27/2021   LDLCALC 67 02/27/2021   Lab Results  Component Value Date   TSH 1.740 11/24/2020    Therapeutic Level Labs: No results found for: LITHIUM No results found for: VALPROATE No components found for:  CBMZ  Current Medications: Current Outpatient Medications  Medication  Sig Dispense Refill   ARIPiprazole (ABILIFY) 5 MG tablet Take 1 tablet (5 mg total) by mouth daily. 30 tablet 1   baclofen (LIORESAL) 10 MG tablet Take 0.5-1 tablets (5-10 mg total) by mouth 3 (three) times daily as needed for muscle spasms. 30 each 3   diclofenac (VOLTAREN) 75 MG EC tablet Take 1 tablet (75 mg total) by mouth 2 (two) times daily as needed. 60 tablet 3   hydrOXYzine (ATARAX/VISTARIL) 10 MG tablet Take 1 tablet (10 mg total) by mouth at bedtime as needed. 60 tablet 1   ibuprofen (ADVIL) 800 MG tablet Take 800 mg by mouth every 8 (eight) hours as needed.     meloxicam (MOBIC) 7.5 MG tablet Take 7.5 mg by mouth daily.     traZODone (DESYREL) 50 MG tablet Take 1 tablet (50 mg total) by mouth at bedtime. 30 tablet 1  No current facility-administered medications for this visit.     Musculoskeletal: Strength & Muscle Tone: Unable to assess due to telemedicine visit Gait & Station: Unable to assess due to telemedicine visit Patient leans: Unable to assess due to telemedicine visit  Psychiatric Specialty Exam: Review of Systems  Psychiatric/Behavioral:  Positive for sleep disturbance. Negative for decreased concentration, dysphoric mood, hallucinations, self-injury and suicidal ideas. The patient is nervous/anxious. The patient is not hyperactive.    There were no vitals taken for this visit.There is no height or weight on file to calculate BMI.  General Appearance: Unable to assess due to telemedicine visit  Eye Contact:  Unable to assess due to telemedicine visit  Speech:  Clear and Coherent and Normal Rate  Volume:  Normal  Mood:  Anxious and Depressed  Affect:  Congruent and Depressed  Thought Process:  Coherent and Descriptions of Associations: Intact  Orientation:  Full (Time, Place, and Person)  Thought Content: WDL   Suicidal Thoughts:  No  Homicidal Thoughts:  No  Memory:  Immediate;   Good Recent;   Good Remote;   Good  Judgement:  Good  Insight:  Good   Psychomotor Activity:  Normal  Concentration:  Concentration: Good and Attention Span: Good  Recall:  Good  Fund of Knowledge: Good  Language: Good  Akathisia:  NA  Handed:  Right  AIMS (if indicated): not done  Assets:  Communication Skills Desire for Improvement Housing Vocational/Educational  ADL's:  Intact  Cognition: WNL  Sleep:  Fair   Screenings: GAD-7    Flowsheet Row Video Visit from 11/05/2021 in [facility] Counselor from 10/06/2021 in [facility] Office Visit from 09/03/2021 in [facility] Office Visit from 08/27/2021 in Primary Care at [facility] Office Visit from 07/01/2021 in [facility]  Total GAD-7 Score 8 17 11 5 5       PHQ2-9    Flowsheet Row Video Visit from 11/05/2021 in [facility] Counselor from 10/06/2021 in [facility] Office Visit from 09/03/2021 in [facility] Office Visit from 08/27/2021 in Primary Care at [facility] Office Visit from 07/01/2021 in [facility]  PHQ-2 Total Score 2 2 2 2  0  PHQ-9 Total Score 7 13 9 3  --      Flowsheet Row Video Visit from 11/05/2021 in [facility] Counselor from 10/06/2021 in [facility] Office Visit from 09/03/2021 in [facility]  C-SSRS RISK CATEGORY Moderate Risk Moderate Risk Moderate Risk        Assessment and Plan:   Farooq Leicht is a 36 year old male with a past psychiatric history significant for bipolar disorder, anxiety, and insomnia who presents to [facility] via virtual telephone visit for follow-up and medication management.  Patient endorses that he continues to experience sleep disturbances as well as some depressive episodes and anxiety.   Patient refuses to adjust medications stating that he will try to start going to bed earlier and see if that helps with managing his anxiety and sleep disturbances.  Patient to continue taking medications as prescribed.  Patient's medications to be e-prescribed to pharmacy of choice.  1. Bipolar disorder, current episode mixed, mild (HCC)  - ARIPiprazole (ABILIFY) 5 MG tablet; Take 1 tablet (5 mg total) by mouth daily.  Dispense: 30 tablet; Refill: 1  2. Anxiety  state Patient to continue taking hydroxyzine 10 mg at bedtime for the management of his anxiety  3. Psychophysiological insomnia  - traZODone (DESYREL) 50 MG tablet; Take 1 tablet (50 mg total) by mouth at bedtime.  Dispense: 30 tablet; Refill: 1  Patient to follow in 2 months Provider spent a total of 24 minutes with the patient/reviewing the patient's chart  Meta HatchetUchenna E Fredrick Dray, PA 11/05/2021, 4:34 PM

## 2021-11-05 NOTE — Therapy (Signed)
[facility] Outpatient Rehabilitation [facility] [address] [place], [place], [address] Phone: [phone]   Fax:  [phone]  Physical Therapy Treatment  Patient Details  Name: Randy Braun MRN: [id number] Date of Birth: [birth year] Referring Provider (PT): Ricky Stabs NP   Encounter Date: 11/05/2021   PT End of Session - 11/05/21 1618     Visit Number 7    Number of Visits 9    Date for PT Re-Evaluation 11/10/21    Authorization Type CAFA    Authorization Time Period 09/29/21-11/10/21    Progress Note Due on Visit 9    PT Start Time 1615    PT Stop Time 1700    PT Time Calculation (min) 45 min    Activity Tolerance Patient tolerated treatment well    Behavior During Therapy [facility] for tasks assessed/performed             Past Medical History:  Diagnosis Date   Anxiety    Depression     Past Surgical History:  Procedure Laterality Date   NO PAST SURGERIES      There were no vitals filed for this visit. [facility] Adult PT Treatment:                                                DATE: 11/05/21 Therapeutic Exercise: Treadmill 10 min 2.0 MPH  Prone press x10 Curl ups x 30 Quadriped thoracic rotations  Therapeutic Activity: Seated core exercises of hip tosses, shoulder tosses, chops and Victories, 10 reps with orange weighted ball followed by latissimus pushdowns with inspiration on active press  Quadriped thoracic rotations   Subjective Assessment - 11/05/21 1629     Subjective Symptoms improved wih self mobilization but not resolved.  Main limitation is walking for prolonged periods which aggravtes symptoms at 10 min mark    Pertinent History 1. Chronic right-sided low back pain with right-sided sciatica         Plan: We discussed the core strengthening and stretching program that he will he can work on.  This is a new job for him and involves a lot more bending turning twisting and previous activities he is done at work.  We reviewed x-rays of his  thoracic spine with minimal curvature as well as lumbar spine negative for acute changes.  Patient is neurologically intact.  He can follow-up as needed.    Pain Onset More than a month ago            [facility] Adult PT Treatment:                                                DATE: 11/03/21 Therapeutic Exercise: Treadmill 8 min 2.0 MPH to try to replicate pain Prone press x10 Curl ups x 30 Prone L fig 4 mobilization of trunk. Trunk mobility WNL and painfree, no pelvic alignment issued noted, palpation unremarkable, able to reproduce symptoms with 8 minute walk on traedmill, relived discomfort withself mobilization of prone L figure 4 and L trunk rotation, noting an audible "pop" and relief of symptoms     [facility] Adult PT Treatment:  DATE: 10/29/21 Therapeutic Exercise: Nustep seat 9, arms 8, L4 8 min Therapeutic Activities: Curl ups 2x15 PT with alt. Marching 15x2 PT with alt. OH flexion 15x2 Prone press x10 Bridge march 15x2 Tall kneel core exercises of hip tosses, shoulder tosses, chops and Victories, 12 reps with blue weighted ball  Tall knee hip hinge with OH ball reach 15x using blue ball B FABERS equal and asymptomatic      PT Short Term Goals - 10/27/21 1231       PT SHORT TERM GOAL #1   Title Independent with initial HEP    Baseline TBD; 10/06/21 [id number], 10/15/21  [id number], 10/27/21 Reports compliance, demos good form with review    Time 2    Period Weeks    Status Achieved    Target Date 10/20/21      PT SHORT TERM GOAL #2   Title Patient to demo normalized pelvic alignment/corrected posterior L ilium    Baseline Posterior L ilial rotation and shortened LLE; 10/06/21 pelvic alignment symmetrical    Time 2    Period Weeks    Status Achieved    Target Date 10/20/21               PT Long Term Goals - 11/05/21 1642       PT LONG TERM GOAL #3   Title Decrease L piriformis irritation to 1/5    Baseline L piriformis  irritation 0/5    Time 4    Period Weeks    Status Achieved    Target Date 11/10/21                   Plan - 11/05/21 1634     Clinical Impression Statement Continues with goof overall trunk mobility despite persistent pain, difficult tp reproduce symptoms other than with walking.  Symptoms point to thoracic dysfunction and possibly less thoracic rotation to R.  Patient approaching maximum benefit from PT at this time.    Personal Factors and Comorbidities Time since onset of injury/illness/exacerbation    Examination-Activity Limitations Lift    Examination-Participation Restrictions Occupation    Stability/Clinical Decision Making Stable/Uncomplicated    Rehab Potential Good    PT Frequency 2x / week    PT Duration 4 weeks    PT Treatment/Interventions ADLs/Self Care Home Management;Aquatic Therapy;Electrical Stimulation;Cryotherapy;Moist Heat;Ultrasound;DME Instruction;Neuromuscular re-education;Balance training;Therapeutic exercise;Therapeutic activities;Functional mobility training;Stair training;Gait training;Patient/family education    PT Next Visit Plan continue to work on thoracic mobility and R rotation, prepare for DC    PT Home Exercise Plan [id number]    Consulted and Agree with Plan of Care Patient              Patient will benefit from skilled therapeutic intervention in order to improve the following deficits and impairments:  Difficulty walking, Decreased range of motion, Decreased activity tolerance, Pain, Decreased mobility, Postural dysfunction  Visit Diagnosis: Sciatica, left side  Low back pain associated with a spinal disorder other than radiculopathy or spinal stenosis  Muscle weakness (generalized)     Problem List Patient Active Problem List   Diagnosis Date Noted   Bipolar disorder, current episode hypomanic (HCC) 07/01/2021   Bipolar disorder, current episode mixed, mild (HCC) 05/05/2021   Insomnia due to other mental disorder  05/05/2021   Intermittent explosive disorder 04/28/2021   Major depressive disorder, recurrent episode, moderate (HCC) 04/28/2021   Cyst of skin 03/26/2021   Chronic right-sided low back pain with right-sided sciatica 11/24/2020   Anxiety state 11/24/2020  Psychophysiological insomnia 11/24/2020    Hildred LaserJeffrey M Chantele Corado, PT 11/05/2021, 4:58 PM  [facility] [address] [place], [place], [address] Phone: [phone]   Fax:  [phone]  Name: Randy Braun MRN: [id number] Date of Birth: 05/16/1986

## 2021-11-08 ENCOUNTER — Encounter (HOSPITAL_COMMUNITY): Payer: Self-pay | Admitting: Physician Assistant

## 2021-11-10 ENCOUNTER — Other Ambulatory Visit: Payer: Self-pay

## 2021-11-10 ENCOUNTER — Ambulatory Visit: Payer: Self-pay

## 2021-11-10 DIAGNOSIS — M6281 Muscle weakness (generalized): Secondary | ICD-10-CM

## 2021-11-10 DIAGNOSIS — M545 Low back pain, unspecified: Secondary | ICD-10-CM

## 2021-11-10 DIAGNOSIS — M5432 Sciatica, left side: Secondary | ICD-10-CM

## 2021-11-10 NOTE — Therapy (Signed)
[facility] Outpatient Rehabilitation [facility] [address] [place], [place], [address] Phone: [phone]   Fax:  [phone]  Physical Therapy Treatment  Patient Details  Name: Randy Braun MRN: [id number] Date of Birth: June 01, 1986 Referring Provider (PT): Ricky Stabs NP   Encounter Date: 11/10/2021   PT End of Session - 11/10/21 1546     Visit Number 9    Number of Visits 9    Date for PT Re-Evaluation 11/10/21    Authorization Type CAFA    Authorization Time Period 09/29/21-11/10/21    Progress Note Due on Visit 9    PT Start Time 1445    PT Stop Time 1515    PT Time Calculation (min) 30 min    Activity Tolerance Patient tolerated treatment well    Behavior During Therapy [facility] for tasks assessed/performed             Past Medical History:  Diagnosis Date   Anxiety    Depression     Past Surgical History:  Procedure Laterality Date   NO PAST SURGERIES      Treatment:  [facility] Adult PT Treatment:                                                DATE: 11/10/21 Therapeutic Exercise: Nustep arms 9, seat 9, L4 8 min Curl up with DKTC 30x Bridge march 15x per LE Prone press x10 Quadruped UE/LE alt 10x Quadruped UE flex, alt 10x Quadruped LE ext alt 10x Quadruped thoracic rotations 10x B   [facility] Adult PT Treatment:                                                DATE: 11/05/21 Therapeutic Exercise: Treadmill 10 min 2.0 MPH  Prone press x10 Curl ups x 30 Quadriped thoracic rotations  Therapeutic Activity: Seated core exercises of hip tosses, shoulder tosses, chops and Victories, 10 reps with orange weighted ball followed by latissimus pushdowns with inspiration on active press  Quadriped thoracic rotations   Subjective Assessment - 11/10/21 1454     Subjective Minimal pain and symptoms noted since last session, moody today due to rainy weather    Pertinent History 1. Chronic right-sided low back pain with right-sided sciatica         Plan: We  discussed the core strengthening and stretching program that he will he can work on.  This is a new job for him and involves a lot more bending turning twisting and previous activities he is done at work.  We reviewed x-rays of his thoracic spine with minimal curvature as well as lumbar spine negative for acute changes.  Patient is neurologically intact.  He can follow-up as needed.    Pain Onset More than a month ago             [facility] Adult PT Treatment:                                                DATE: 11/03/21 Therapeutic Exercise: Treadmill 8 min 2.0 MPH to try to replicate pain Prone  press x10 Curl ups x 30 Prone L fig 4 mobilization of trunk. Trunk mobility WNL and painfree, no pelvic alignment issued noted, palpation unremarkable, able to reproduce symptoms with 8 minute walk on traedmill, relived discomfort withself mobilization of prone L figure 4 and L trunk rotation, noting an audible "pop" and relief of symptoms     [facility] Adult PT Treatment:                                                DATE: 10/29/21 Therapeutic Exercise: Nustep seat 9, arms 8, L4 8 min Therapeutic Activities: Curl ups 2x15 PT with alt. Marching 15x2 PT with alt. OH flexion 15x2 Prone press x10 Bridge march 15x2 Tall kneel core exercises of hip tosses, shoulder tosses, chops and Victories, 12 reps with blue weighted ball  Tall Braun hip hinge with OH ball reach 15x using blue ball B FABERS equal and asymptomatic      PT Short Term Goals - 10/27/21 1231       PT SHORT TERM GOAL #1   Title Independent with initial HEP    Baseline TBD; 10/06/21 [id number], 10/15/21  [id number], 10/27/21 Reports compliance, demos good form with review    Time 2    Period Weeks    Status Achieved    Target Date 10/20/21      PT SHORT TERM GOAL #2   Title Patient to demo normalized pelvic alignment/corrected posterior L ilium    Baseline Posterior L ilial rotation and shortened LLE; 10/06/21 pelvic alignment symmetrical     Time 2    Period Weeks    Status Achieved    Target Date 10/20/21               PT Long Term Goals - 11/10/21 1510       PT LONG TERM GOAL #1   Title Independent with final HEP    Baseline [id number]    Time 4    Period Weeks    Status New    Target Date 11/10/21      PT LONG TERM GOAL #2   Title Improve L FABER position to be equal to R    Baseline Mild restrictions at end range 10/29/21 FABERs equal and asymptomatic B    Time 4    Period Weeks    Status Achieved    Target Date 11/10/21      PT LONG TERM GOAL #3   Title Decrease L piriformis irritation to 1/5    Baseline L piriformis irritation 3/5    Time 4    Period Weeks    Status New    Target Date 11/10/21                   Plan - 11/10/21 1524     Clinical Impression Statement Minimal to no symptoms since last session, rated himself at 72%    Personal Factors and Comorbidities Time since onset of injury/illness/exacerbation    Examination-Activity Limitations Lift    Examination-Participation Restrictions Occupation    Stability/Clinical Decision Making Stable/Uncomplicated    Rehab Potential Good    PT Frequency 2x / week    PT Duration 4 weeks    PT Treatment/Interventions ADLs/Self Care Home Management;Aquatic Therapy;Electrical Stimulation;Cryotherapy;Moist Heat;Ultrasound;DME Instruction;Neuromuscular re-education;Balance training;Therapeutic exercise;Therapeutic activities;Functional mobility training;Stair training;Gait training;Patient/family education    PT Next Visit  Plan continue to work on thoracic mobility and R rotation, prepare for DC    PT Home Exercise Plan [id number]    Consulted and Agree with Plan of Care Patient               Patient will benefit from skilled therapeutic intervention in order to improve the following deficits and impairments:  Difficulty walking, Decreased range of motion, Decreased activity tolerance, Pain, Decreased mobility, Postural  dysfunction  Visit Diagnosis: Low back pain associated with a spinal disorder other than radiculopathy or spinal stenosis  Sciatica, left side  Muscle weakness (generalized)     Problem List Patient Active Problem List   Diagnosis Date Noted   Bipolar disorder, current episode hypomanic (HCC) 07/01/2021   Bipolar disorder, current episode mixed, mild (HCC) 05/05/2021   Insomnia due to other mental disorder 05/05/2021   Intermittent explosive disorder 04/28/2021   Major depressive disorder, recurrent episode, moderate (HCC) 04/28/2021   Cyst of skin 03/26/2021   Chronic right-sided low back pain with right-sided sciatica 11/24/2020   Anxiety state 11/24/2020   Psychophysiological insomnia 11/24/2020    Hildred Laser, PT 11/10/2021, 3:48 PM  [facility] [address] [place], [place], [address] Phone: [phone]   Fax:  [phone]  Name: Randy Braun MRN: [id number] Date of Birth: 10/31/1985

## 2021-11-13 ENCOUNTER — Other Ambulatory Visit: Payer: Self-pay

## 2021-11-13 ENCOUNTER — Ambulatory Visit: Payer: Self-pay

## 2021-11-13 DIAGNOSIS — M545 Low back pain, unspecified: Secondary | ICD-10-CM

## 2021-11-13 DIAGNOSIS — M6281 Muscle weakness (generalized): Secondary | ICD-10-CM

## 2021-11-13 DIAGNOSIS — M5432 Sciatica, left side: Secondary | ICD-10-CM

## 2021-11-13 NOTE — Patient Instructions (Signed)
Access Code: [email or web address] URL: [email or web address] Date: 11/13/2021 Prepared by: Gustavus Bryant  Exercises Single Leg Bridge - 2 x daily - 7 x weekly - 2 sets - 15 reps Quadruped Full Range Thoracic Rotation with Reach - 2 x daily - 7 x weekly - 1 sets - 10 reps Supine 90/90 Shoulder Flexion with Abdominal Bracing - 2 x daily - 7 x weekly - 2 sets - 15 reps - 30s hold Diagonal Curl Up with Arms Crossed - 2 x daily - 7 x weekly - 2 sets - 15 reps - 30s hold

## 2021-11-13 NOTE — Therapy (Signed)
[place], [place], [address] Phone: [phone]   Fax:  [phone]  Physical Therapy Treatment/DC Summary  Patient Details  Name: Randy Braun MRN: [id number] Date of Birth: [birth year] Referring Provider (PT): Durene Fruits NP   Encounter Date: 11/13/2021  PHYSICAL THERAPY DISCHARGE SUMMARY  Visits from Start of Care: 10  Current functional level related to goals / functional outcomes: Goals met   Remaining deficits: Mild discomfort with sweeping   Education / Equipment: HEP   Patient agrees to discharge. Patient goals were met. Patient is being discharged due to being pleased with the current functional level.   PT End of Session - 11/13/21 1203     Visit Number 10    Number of Visits 10    Date for PT Re-Evaluation 11/10/21    Authorization Type CAFA    Authorization Time Period 09/29/21-11/10/21    Progress Note Due on Visit 10    PT Start Time 1045    PT Stop Time 1125    PT Time Calculation (min) 40 min    Activity Tolerance Patient tolerated treatment well    Behavior During Therapy WFL for tasks assessed/performed             Past Medical History:  Diagnosis Date   Anxiety    Depression     Past Surgical History:  Procedure Laterality Date   NO PAST SURGERIES      There were no vitals filed for this visit.   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 Chronic right-sided low back pain with right-sided sciatica         Plan: We discussed the core strengthening and stretching program that he will he can work on.  This is a new job for him and involves a lot more bending turning twisting and previous activities he is done at work.  We reviewed x-rays of his thoracic spine with minimal curvature as well as lumbar spine negative for acute  changes.  Patient is neurologically intact.  He can follow-up as needed.    Pain Onset More than a month ago                                          PT Short Term Goals - 10/27/21 1231       PT SHORT TERM GOAL #1   Title Independent with initial HEP    Baseline TBD; 10/06/21 [id number], 10/15/21  [id number], 10/27/21 Reports compliance, demos good form with review    Time 2    Period Weeks    Status Achieved    Target Date 10/20/21      PT SHORT TERM GOAL #2   Title Patient to demo normalized pelvic alignment/corrected posterior L ilium    Baseline Posterior L ilial rotation and shortened LLE; 10/06/21 pelvic alignment symmetrical    Time 2    Period Weeks    Status Achieved    Target Date 10/20/21               PT Long Term Goals - 11/13/21 1053       PT LONG TERM GOAL #1   Title Independent with final HEP    Baseline [id number]; 11/13/21 Updated and  reviewed today    Time 4    Period Weeks    Status Achieved    Target Date 11/10/21      PT LONG TERM GOAL #2   Title Improve L FABER position to be equal to R    Baseline Mild restrictions at end range 10/29/21 FABERs equal and asymptomatic B    Time 4    Period Weeks    Status Achieved    Target Date 11/10/21      PT LONG TERM GOAL #3   Title Decrease L piriformis irritation to 1/5    Baseline L piriformis irritation 3/5; 11/13/21 Lpiriformis irritation 0/5    Time 4    Period Weeks    Status Achieved    Target Date 11/10/21                   Plan - 11/13/21 1117     Clinical Impression Statement All rehab goals met, unable to reproduce any symptoms in clinic, HEP updated and finalized    Personal Factors and Comorbidities Time since onset of injury/illness/exacerbation    Examination-Activity Limitations Lift    Examination-Participation Restrictions Occupation    Stability/Clinical Decision Making Stable/Uncomplicated    Rehab Potential Good    PT Frequency 2x / week     PT Duration 4 weeks    PT Treatment/Interventions ADLs/Self Care Home Management;Aquatic Therapy;Electrical Stimulation;Cryotherapy;Moist Heat;Ultrasound;DME Instruction;Neuromuscular re-education;Balance training;Therapeutic exercise;Therapeutic activities;Functional mobility training;Stair training;Gait training;Patient/family education    PT Next Visit [place] and Agree with Plan of Care Patient             Patient will benefit from skilled therapeutic intervention in order to improve the following deficits and impairments:  Difficulty walking, Decreased range of motion, Decreased activity tolerance, Pain, Decreased mobility, Postural dysfunction  Visit Diagnosis: Low back pain associated with a spinal disorder other than radiculopathy or spinal stenosis  Sciatica, left side  Muscle weakness (generalized)     Problem List Patient Active Problem List   Diagnosis Date Noted   Bipolar disorder, current episode hypomanic ([place]) 07/01/2021   Bipolar disorder, current episode mixed, mild ([place]) 05/05/2021   Insomnia due to other mental disorder 05/05/2021   Intermittent explosive disorder 04/28/2021   Major depressive disorder, recurrent episode, moderate ([place]) 04/28/2021   Cyst of skin 03/26/2021   Chronic right-sided low back pain with right-sided sciatica 11/24/2020   Anxiety state 11/24/2020   Psychophysiological insomnia 11/24/2020    Lanice Shirts, PT 11/13/2021, 12:08 PM  [place] [facility] [address] [place], [place], [address] Phone: [phone]   Fax:  [phone]  Name: Randy Braun MRN: [id number] Date of Birth: 21-Aug-1986

## 2021-11-17 ENCOUNTER — Other Ambulatory Visit: Payer: Self-pay

## 2021-11-17 ENCOUNTER — Ambulatory Visit (INDEPENDENT_AMBULATORY_CARE_PROVIDER_SITE_OTHER): Payer: No Payment, Other | Admitting: Licensed Clinical Social Worker

## 2021-11-17 DIAGNOSIS — F3161 Bipolar disorder, current episode mixed, mild: Secondary | ICD-10-CM

## 2021-11-17 NOTE — Progress Notes (Addendum)
° °  THERAPIST PROGRESS NOTE  Session Time: 24  Participation Level: Active  Behavioral Response: Casual and Fairly GroomedAlertAnxious and Depressed  Type of Therapy: Individual Therapy  Treatment Goals addressed: Anxiety and Coping  Interventions: CBT and Motivational Interviewing  Summary: Nimrod Denio is a 36 y.o. male who presents with depressed and anxious mood\affect.  Patient was pleasant, cooperative, and maintained good eye contact.  He engaged well in therapy session and was dressed casually.  Primary stressors for patient's and his work and self motivation.  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  He reports that this is increased his depression for hopelessness feelings, worthlessness feelings, and irritability. Rafay believes that he was born for morning when what he is currently doing and For minimum wage.   Suicidal/Homicidal: Nowithout intent/plan  Therapist Response:    LCSW utilized motivational interviewing and cognitive behavioral therapy as primary ways of intervention today.  LCSW utilized cognitive restructuring, reframing, and positive affirmations in today's session.  LCSW spoke with patient about utilizing 50 positive affirmation worksheet that was provided for him today at least 2 times daily.  Patient to follow-up with [place] [facility] which is another resource provided today by LCSW at least 1 time in the next 4 weeks. Plan: Return again in 4  weeks.     Dory Horn, LCSW 11/17/2021

## 2021-11-18 ENCOUNTER — Other Ambulatory Visit: Payer: Self-pay

## 2021-12-08 ENCOUNTER — Ambulatory Visit (HOSPITAL_COMMUNITY): Payer: No Payment, Other | Admitting: Licensed Clinical Social Worker

## 2021-12-08 ENCOUNTER — Other Ambulatory Visit: Payer: Self-pay

## 2021-12-08 DIAGNOSIS — F3161 Bipolar disorder, current episode mixed, mild: Secondary | ICD-10-CM

## 2021-12-08 NOTE — Plan of Care (Signed)
Pt agreeable to plan  ?

## 2021-12-08 NOTE — Progress Notes (Signed)
° °  THERAPIST PROGRESS NOTE  Session Time: 59  Participation Level: Active  Behavioral Response: CasualAlertAnxious and Depressed  Type of Therapy: Individual Therapy  Treatment Goals addressed: Stabilize mood and increase goal-directed behavior: Using positive affirmation 1 x daily   ProgressTowards Goals:   Interventions: CBT, Motivational Interviewing, and Supportive  Summary: Randy Braun is a 36 y.o. male who presents with depressed and anxious mood\affect.  Patient was pleasant, cooperative, maintained good eye contact.  He was alert and oriented x5.  He engaged well in therapy session and was dressed casually.  Patient's primary stressors are motivation, depression, financials, and work.  Patient reports that he has been looking in his crypto current C profile and it is not going well.  Patient reports that his financial situation is not what he wants it to be.[name] states that he has been struggling to find motivation for day-to-day activities as he "feels in a rut".  Patient reports waking up in the mornings and and scrolling through 2.  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  He does report some positivity at work and within his family.  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  Patient reports he is received more support in this area.  Patient reports that he did not feel like he was getting support through his family and really did not talk to his family.  Patient reports that he recently reached out to his uncle and the conversation went well with his uncle wanting him to reach back out again.  Suicidal/Homicidal: Nowithout intent/plan  Therapist Response:    Intervention/Plan: Plan for patient moving forward will be to utilize positive affirmations 1 time daily.  This will either be in the morning or at night.  Patient to pick 1 positive affirmation  and how it has applied to him in his past.  LCSW utilized techniques as cognitive restructuring for looking at things and positivity rather than negativity.  LCSW used CBT for education on signs and symptoms of bipolar depression.  LCSW utilized motivational interviewing for positive affirmations, open-ended questions, and reflective listening.  Plan: Return again in 4 weeks.  Diagnosis: Bipolar disorder, current episode hypomanic (HCC)    Patient/Guardian was advised Release of Information must be obtained prior to any record release in order to collaborate their care with an outside provider. Patient/Guardian was advised if they have not already done so to contact the registration department to sign all necessary forms in order for [place] to release information regarding their care.   Consent: Patient/Guardian gives verbal consent for treatment and assignment of benefits for services provided during this telehealth visit. Patient/Guardian expressed understanding and agreed to proceed.   Weber Cooks, LCSW 12/08/2021

## 2021-12-17 ENCOUNTER — Other Ambulatory Visit: Payer: Self-pay

## 2021-12-18 ENCOUNTER — Other Ambulatory Visit: Payer: Self-pay

## 2021-12-22 ENCOUNTER — Telehealth: Payer: Self-pay | Admitting: Family

## 2021-12-22 NOTE — Telephone Encounter (Signed)
Pt states he needs a referral for dentist for possibly a cavity. It's sensitive when he eats sweets or even eating solids.  Curretnly Pt  has letter of Financial Assistance good until March 26th, 2023 and is asking to be sent to an office where they accept this. Please advise and thank you.

## 2021-12-25 NOTE — Telephone Encounter (Signed)
Pt needs to be given dental resources, unable to use orange card w/dentist ?

## 2021-12-30 ENCOUNTER — Other Ambulatory Visit: Payer: Self-pay

## 2021-12-30 ENCOUNTER — Ambulatory Visit (HOSPITAL_COMMUNITY): Payer: No Payment, Other | Admitting: Licensed Clinical Social Worker

## 2021-12-30 DIAGNOSIS — F310 Bipolar disorder, current episode hypomanic: Secondary | ICD-10-CM

## 2021-12-30 NOTE — Progress Notes (Signed)
? ?  THERAPIST PROGRESS NOTE ? ?Session Time: 40 ? ?Participation Level: Active ? ?Behavioral Response: CasualAlertAnxious and Depressed ? ?Type of Therapy: Individual Therapy ? ?Treatment Goals addressed: STG: Lowen WILL IDENTIFY COGNITIVE PATTERNS AND BELIEFS THAT INTERFERE WITH  ?THERAPY & STG: Kiernan WILL ATTEND AT LEAST 80% OF SCHEDULED FOLLOW-UP MEDICATION MANAGEMENT  ?APPOINTMENTS ? ?ProgressTowards Goals: Progressing ? ?Interventions: CBT, Motivational Interviewing, and Supportive ? ?Summary: Randy Braun is a 36 y.o. male who presents with depressed and anxious mood\affect.  Patient was pleasant, cooperative, maintained good eye contact.  He engaged well in therapy session and was dressed casually.  Patient was alert and oriented x5. ? Patient reports primary stressors as illness, work, and Education officer, community.  Patient reports that he has a "cavity" that has been bothering him and causing him pain.  Patient reports that he was provided resources at a different facility to reach out for dental assistance.  LCSW did speak with case management team here at [facility] and got a list of resources for low income to no cost dental work and [facility].  Patient reports other stressors as financial.  Patient reports that he was on Twitter and found out about a crypto hack.  Patient reports that his account was not hacked.  But he needs to gain access to some kind of security software that will help to his crypto from getting hacked.  Patient reports that he currently has $400 in cryptocurrency exchange.  Patient reports that he is doing crypto to invest in his future.  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  He reports that they have special needs, but did not know that they had special needs and attempted to physically restrain them.   Patient reports that he did not get in trouble but states that it has been stressful at his job. ? ?Suicidal/Homicidal: Nowithout intent/plan ? ?Therapist Response:  ? ? Intervention/Plan: LCSW completed psychiatric evaluation on 7 -5-22.  Is taking medications as prescribed.  Patient endorses symptoms for mania for euphoria, reckless behavior, and rapid thoughts.  Patient is also progressing towards cognitive patterns that are disrupting therapy such as manic symptoms for rapid thoughts and tangential thought process for investing in cryptocurrency.  Patient is maintaining above and 80% medication management appointment right. ? ?Plan: Return again in 4 weeks. ? ?Diagnosis: Bipolar disorder, current episode hypomanic (HCC) ? ?Collaboration of Care: Other Referral to [facility].   ? ?Patient/Guardian was advised Release of Information must be obtained prior to any record release in order to collaborate their care with an outside provider. Patient/Guardian was advised if they have not already done so to contact the registration department to sign all necessary forms in order for [place] to release information regarding their care.  ? ?Consent: Patient/Guardian gives verbal consent for treatment and assignment of benefits for services provided during this visit. Patient/Guardian expressed understanding and agreed to proceed.  ? ?Weber Cooks, LCSW ?12/30/2021 ? ?

## 2021-12-30 NOTE — Plan of Care (Signed)
Pt completed psychiatric CCA evaluation on 04/28/21 goal/objective met. Pt continue to make progress towards maintaining 80 percent attendance at session. Pt spoke about cognitive pattern or belief that interfere with therapy such as crypto currency investments  ?  ?

## 2021-12-31 ENCOUNTER — Telehealth (INDEPENDENT_AMBULATORY_CARE_PROVIDER_SITE_OTHER): Payer: No Payment, Other | Admitting: Physician Assistant

## 2021-12-31 ENCOUNTER — Other Ambulatory Visit: Payer: Self-pay

## 2021-12-31 DIAGNOSIS — F3161 Bipolar disorder, current episode mixed, mild: Secondary | ICD-10-CM

## 2021-12-31 DIAGNOSIS — F5104 Psychophysiologic insomnia: Secondary | ICD-10-CM | POA: Diagnosis not present

## 2021-12-31 MED ORDER — TRAZODONE HCL 50 MG PO TABS
50.0000 mg | ORAL_TABLET | Freq: Every day | ORAL | 1 refills | Status: DC
  Filled 2021-12-31 – 2022-01-15 (×3): qty 30, 30d supply, fill #0
  Filled 2022-02-23: qty 30, 30d supply, fill #1

## 2021-12-31 MED ORDER — ARIPIPRAZOLE 10 MG PO TABS
10.0000 mg | ORAL_TABLET | Freq: Every day | ORAL | 1 refills | Status: DC
  Filled 2021-12-31: qty 30, 30d supply, fill #0
  Filled 2022-02-02: qty 30, 30d supply, fill #1

## 2021-12-31 NOTE — Progress Notes (Cosign Needed)
BH MD/PA/NP OP Progress Note  Virtual Visit via Telephone Note  I connected with Randy Braun on 12/31/21 at  2:30 PM EST by telephone and verified that I am speaking with the correct person using two identifiers.  Location: Patient: Home Provider: Clinic   I discussed the limitations, risks, security and privacy concerns of performing an evaluation and management service by telephone and the availability of in person appointments. I also discussed with the patient that there may be a patient responsible charge related to this service. The patient expressed understanding and agreed to proceed.  Follow Up Instructions:  I discussed the assessment and treatment plan with the patient. The patient was provided an opportunity to ask questions and all were answered. The patient agreed with the plan and demonstrated an understanding of the instructions.   The patient was advised to call back or seek an in-person evaluation if the symptoms worsen or if the condition fails to improve as anticipated.  I provided 20 minutes of non-face-to-face time during this encounter.  Meta Hatchet, PA    12/31/2021 7:28 PM Randy Braun  MRN:  [id number]  Chief Complaint:  Chief Complaint  Patient presents with   Follow-up   Medication Management   HPI:   Randy Braun  Visit Diagnosis:    ICD-10-CM   1. Bipolar disorder, current episode mixed, mild (HCC)  F31.61 ARIPiprazole (ABILIFY) 10 MG tablet    2. Psychophysiological insomnia  F51.04 traZODone (DESYREL) 50 MG tablet      Past Psychiatric History:  Bipolar disorder Insomnia Anxiety  Past Medical History:  Past Medical History:  Diagnosis Date   Anxiety    Depression     Past Surgical History:  Procedure Laterality Date   NO PAST SURGERIES      Family Psychiatric History:  Patient is unsure of family psychiatric history  Family History:  Family History  Family history unknown: Yes    Social History:  Social  History   Socioeconomic History   Marital status: Single    Spouse name: Not on file   Number of children: Not on file   Years of education: Not on file   Highest education level: Not on file  Occupational History   Not on file  Tobacco Use   Smoking status: Never   Smokeless tobacco: Never  Vaping Use   Vaping Use: Every day   Substances: Nicotine, THC  Substance and Sexual Activity   Alcohol use: Yes    Comment: occasionally   Drug use: Yes    Types: Marijuana   Sexual activity: Not on file  Other Topics Concern   Not on file  Social History Narrative   Not on file   Social Determinants of Health   Financial Resource Strain: Medium Risk   Difficulty of Paying Living Expenses: Somewhat hard  Food Insecurity: No Food Insecurity   Worried About Running Out of Food in the Last Year: Never true   Ran Out of Food in the Last Year: Never true  Transportation Needs: No Transportation Needs   Lack of Transportation (Medical): No   Lack of Transportation (Non-Medical): No  Physical Activity: Inactive   Days of Exercise per Week: 0 days   Minutes of Exercise per Session: 0 min  Stress: Stress Concern Present   Feeling of Stress : To some extent  Social Connections: Socially Isolated   Frequency of Communication with Friends and Family: More than three times a week   Frequency of Social Gatherings  with Friends and Family: Once a week   Attends Religious Services: Never   Database administratorActive Member of Clubs or Organizations: No   Attends BankerClub or Organization Meetings: Never   Marital Status: Never married    Allergies:  Allergies  Allergen Reactions   Vinegar [Acetic Acid] Other (See Comments)    Red Vinegar- flush    Metabolic Disorder Labs: Lab Results  Component Value Date   HGBA1C 5.8 (H) 02/27/2021   No results found for: PROLACTIN Lab Results  Component Value Date   CHOL 134 02/27/2021   TRIG 57 02/27/2021   HDL 55 02/27/2021   CHOLHDL 2.4 02/27/2021   LDLCALC 67  02/27/2021   Lab Results  Component Value Date   TSH 1.740 11/24/2020    Therapeutic Level Labs: No results found for: LITHIUM No results found for: VALPROATE No components found for:  CBMZ  Current Medications: Current Outpatient Medications  Medication Sig Dispense Refill   ARIPiprazole (ABILIFY) 10 MG tablet Take 1 tablet (10 mg total) by mouth daily. 30 tablet 1   baclofen (LIORESAL) 10 MG tablet Take 0.5-1 tablets (5-10 mg total) by mouth 3 (three) times daily as needed for muscle spasms. 30 each 3   diclofenac (VOLTAREN) 75 MG EC tablet Take 1 tablet (75 mg total) by mouth 2 (two) times daily as needed. 60 tablet 3   hydrOXYzine (ATARAX/VISTARIL) 10 MG tablet Take 1 tablet (10 mg total) by mouth at bedtime as needed. 60 tablet 1   ibuprofen (ADVIL) 800 MG tablet Take 800 mg by mouth every 8 (eight) hours as needed.     meloxicam (MOBIC) 7.5 MG tablet Take 7.5 mg by mouth daily.     traZODone (DESYREL) 50 MG tablet Take 1 tablet (50 mg total) by mouth at bedtime. 30 tablet 1   No current facility-administered medications for this visit.     Musculoskeletal: Strength & Muscle Tone: Unable to assess due to telemedicine visit Gait & Station: Unable to assess due to telemedicine visit Patient leans: Unable to assess due to telemedicine visit  Psychiatric Specialty Exam: Review of Systems  Psychiatric/Behavioral:  Positive for sleep disturbance. Negative for decreased concentration, dysphoric mood, hallucinations, self-injury and suicidal ideas. The patient is nervous/anxious and is hyperactive.    There were no vitals taken for this visit.There is no height or weight on file to calculate BMI.  General Appearance: Unable to assess due to telemedicine visit  Eye Contact:  Unable to assess due to telemedicine visit  Speech:  Clear and Coherent and Normal Rate  Volume:  Normal  Mood:  Anxious and Euthymic  Affect:  Appropriate and Congruent  Thought Process:  Coherent, Goal  Directed, and Descriptions of Associations: Intact  Orientation:  Full (Time, Place, and Person)  Thought Content: WDL   Suicidal Thoughts:  No  Homicidal Thoughts:  No  Memory:  Immediate;   Good Recent;   Good Remote;   Good  Judgement:  Good  Insight:  Good  Psychomotor Activity:  Normal  Concentration:  Concentration: Good and Attention Span: Good  Recall:  Good  Fund of Knowledge: Good  Language: Good  Akathisia:  No  Handed:  Right  AIMS (if indicated): not done  Assets:  Communication Skills Desire for Improvement Housing Vocational/Educational  ADL's:  Intact  Cognition: WNL  Sleep:  Fair   Screenings: GAD-7    Flowsheet Row [facility] from 10/06/2021 in [facility] Office Visit from 09/03/2021 in [facility] Office Visit from 08/27/2021 in Primary Care at [facility]  Total GAD-7 Score 6 8 17 11 5       PHQ2-9    Flowsheet Row Video Visit from 12/31/2021 in [facility] Video Visit from 11/05/2021 in [facility] Counselor from 10/06/2021 in [facility] Office Visit from 09/03/2021 in [facility] Office Visit from 08/27/2021 in Primary Care at [facility]  PHQ-2 Total Score 0 2 2 2 2   PHQ-9 Total Score -- 7 13 9 3       Flowsheet Row Video Visit from 12/31/2021 in [facility] Video Visit from 11/05/2021 in [facility] Counselor from 10/06/2021 in [facility]  C-SSRS RISK CATEGORY Low Risk Moderate Risk Moderate Risk        Assessment and Plan:     Collaboration of Care: Collaboration of Care: Medication Management AEB provider managing patient's psychiatric  medications, Psychiatrist AEB patient being followed by mental health provider, and Referral or follow-up with counselor/therapist AEB patient being seen by a licensed clinical social worker at this facility  Patient/Guardian was advised Release of Information must be obtained prior to any record release in order to collaborate their care with an outside provider. Patient/Guardian was advised if they have not already done so to contact the registration department to sign all necessary forms in order for [facility] to release information regarding their care.   Consent: Patient/Guardian gives verbal consent for treatment and assignment of benefits for services provided during this visit. Patient/Guardian expressed understanding and agreed to proceed.   1. Bipolar disorder, current episode mixed, mild (HCC)  - ARIPiprazole (ABILIFY) 10 MG tablet; Take 1 tablet (10 mg total) by mouth daily.  Dispense: 30 tablet; Refill: 1  2. Psychophysiological insomnia  - traZODone (DESYREL) 50 MG tablet; Take 1 tablet (50 mg total) by mouth at bedtime.  Dispense: 30 tablet; Refill: 1  Patient to follow up in 2 months Provider spent a total of 20 minutes with the patient/reviewing patient's chart  10/08/2021, PA 12/31/2021, 7:28 PM

## 2022-01-01 ENCOUNTER — Other Ambulatory Visit: Payer: Self-pay

## 2022-01-03 ENCOUNTER — Encounter (HOSPITAL_COMMUNITY): Payer: Self-pay | Admitting: Physician Assistant

## 2022-01-05 ENCOUNTER — Other Ambulatory Visit: Payer: Self-pay

## 2022-01-06 ENCOUNTER — Other Ambulatory Visit: Payer: Self-pay

## 2022-01-07 ENCOUNTER — Other Ambulatory Visit: Payer: Self-pay

## 2022-01-15 ENCOUNTER — Other Ambulatory Visit: Payer: Self-pay

## 2022-01-19 ENCOUNTER — Other Ambulatory Visit: Payer: Self-pay

## 2022-01-26 ENCOUNTER — Ambulatory Visit (INDEPENDENT_AMBULATORY_CARE_PROVIDER_SITE_OTHER): Payer: No Payment, Other | Admitting: Licensed Clinical Social Worker

## 2022-01-26 ENCOUNTER — Encounter (HOSPITAL_COMMUNITY): Payer: Self-pay

## 2022-01-26 DIAGNOSIS — F3161 Bipolar disorder, current episode mixed, mild: Secondary | ICD-10-CM

## 2022-01-26 NOTE — Progress Notes (Signed)
? ?  THERAPIST PROGRESS NOTE ? ?Session Time: 40  ? ?Participation Level: Active ? ?Behavioral Response: CasualAlertAnxious and Depressed ? ?Type of Therapy: Individual Therapy ? ?Treatment Goals addressed:  ? ?ProgressTowards Goals: Progressing ? ?Interventions: CBT, Motivational Interviewing, and Supportive ? ?Summary: Randy Braun is a 36 y.o. male who presents with depressed and anxious mood\affect.  Patient was pleasant, cooperative, maintained good eye contact.  He engaged well in therapy session and was dressed casually. ? Patient's primary stressors are illness, work, and Education officer, community.  Patient reports that he has been trying to get his taxes done but due to his crypto investments he has been struggling to complete his taxes.  On top of that patient reports that his could not find his state ID and needed to renew his learning permit in order to get a new number to submit his taxes.  Patient reports that he has been stressed due to illness as well as he needs either a root canal completed which will cost about $3000 or he needs to get 2 teeth removed which will cost about $250.  Patient reports that he was planning on his taxes to help him pay for this but he is only getting about $100 back this year.  Patient reports this is the least amount he has gotten in years.  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 ? ?Suicidal/Homicidal: Nowithout intent/plan ? ?Therapist Response:  ? ? Intervention/Plan: LCSW administered GAD-7 goal\objective for patient for GAD-7 is below a 5.  Patient current score is an 8 which is an increase of 2 points since the last administered 1 month ago.  LCSW also administered a PHQ-9 patient  went from a 0 to a 6 in today's session.  Patient's goal\objective is below a 10.  LCSW utilized supportive therapy for praise and encouragement.  LCSW used education for cognitive behavioral therapy for restructuring and partializing.  Plan for patient is to complete a tasks list for the entire week and then breakdown that complete a list into individual days of the week.  This is to help break down his "to do list" and to decrease his symptoms for being overwhelmed. ? ?Plan: Return again in 4 weeks. ? ?Diagnosis: Bipolar disorder, current episode mixed, mild (HCC) ? ?Collaboration of Care: Other none in today's session  ? ?Patient/Guardian was advised Release of Information must be obtained prior to any record release in order to collaborate their care with an outside provider. Patient/Guardian was advised if they have not already done so to contact the registration department to sign all necessary forms in order for [place] to release information regarding their care.  ? ?Consent: Patient/Guardian gives verbal consent for treatment and assignment of benefits for services provided during this visit. Patient/Guardian expressed understanding and agreed to proceed.  ? ?Weber Cooks, LCSW ?01/26/2022 ? ?

## 2022-01-26 NOTE — Plan of Care (Signed)
?  Problem: Bipolar Disorder CCP Problem  1 bipolar disorder depressed  ?Goal: Decrease PHQ-9 below 10  ?Outcome: Progressing ?Goal: Decrease GAD-7 below 5  ?Outcome: Not Progressing ?Goal: LTG: Stabilize mood and increase goal-directed behavior: Using positive affirmation 1 x daily  ?Outcome: Not Progressing ?Goal: STG: Alam WILL IDENTIFY COGNITIVE PATTERNS AND BELIEFS THAT INTERFERE WITH THERAPY ?Outcome: Progressing ?Goal: STG: Jessy WILL ATTEND AT LEAST 80% OF SCHEDULED FOLLOW-UP MEDICATION MANAGEMENT APPOINTMENTS ?Outcome: Progressing ?  ?

## 2022-02-02 ENCOUNTER — Other Ambulatory Visit: Payer: Self-pay

## 2022-02-16 ENCOUNTER — Ambulatory Visit (INDEPENDENT_AMBULATORY_CARE_PROVIDER_SITE_OTHER): Payer: No Payment, Other | Admitting: Licensed Clinical Social Worker

## 2022-02-16 DIAGNOSIS — F3161 Bipolar disorder, current episode mixed, mild: Secondary | ICD-10-CM | POA: Diagnosis not present

## 2022-02-16 NOTE — Plan of Care (Signed)
?  Problem: Bipolar Disorder CCP Problem  1 bipolar disorder depressed  ?Goal: Decrease PHQ-9 below 10  ?Outcome: Progressing ?Note: Decreased from a 6 to a 2  ?Goal: Decrease GAD-7 below 5  ?Outcome: Progressing ?Note: Decreased from an 8 to 3  ?Goal: LTG: Stabilize mood and increase goal-directed behavior: Using positive affirmation 1 x daily  ?Outcome: Progressing ?Note: 5 days weekly  ?Goal: STG: Zavon WILL ATTEND AT LEAST 80% OF SCHEDULED FOLLOW-UP MEDICATION MANAGEMENT APPOINTMENTS ?Outcome: Progressing ?Intervention: WORK WITH Markis TO DISCUSS RISKS AND BENEFITS OF MEDICATION TREATMENT OPTIONS FOR THIS PROBLEM AND PRESCRIBE AS INDICATED ?Intervention: Conduct a review of all medications to evaluate any drug/drug interactions ?Intervention: REVIEW WITH Kaeden THEIR RESPONSE TO THE PRESCRIBED MEDICATION, INCLUDING ANY SIDE EFFECTS ?Intervention: ENCOURAGE Kaiyan TO KEEP A LOG OF MEDICATION SIDE EFFECTS, QUESTIONS REGARDING MEDICATION, AND SYMPTOMS ?Intervention: INSTRUCT Brigham TO TAKE PSYCHOTROPIC MEDICATION AS PRESCRIBED ?Intervention: INSTRUCT Kazmir TO COMMUNICATE EFFECTS OF PRESCRIBED MEDICATIONS ?Intervention: EDUCATE Zymere ON BIPOLAR DISORDER DIAGNOSTIC CRITERIA ?  ?

## 2022-02-16 NOTE — Progress Notes (Signed)
? ?  THERAPIST PROGRESS NOTE ? ?Session Time: 40 ? ?Participation Level: Active ? ?Behavioral Response: CasualAlertAnxious and Depressed ? ?Type of Therapy: Individual Therapy ? ?Treatment Goals addressed: Decreased PHQ-9 below a 10 and decreased GAD-7 below a 5 ? ?ProgressTowards Goals: Progressing ? ?Interventions: CBT, Motivational Interviewing, and Supportive ? ?Summary: Randy Braun is a 36 y.o. male who presents with depressed and anxious mood\affect.  Patient was alert and oriented x5.  Patient was pleasant, cooperative, maintained good eye contact.  He was dressed casually in today's session. ? Patient comes in with primary stressors as illness, financials, and work.  Patient reports that he has been dealing with a tooth that needs to be pulled.  LCSW last session gave patient resources to get tooth pulled, but patient currently does not have the money for it.  Patient states that he is going to explore into Medicaid, but fears if he reports his income he may decreased his food stamps.  LCSW spoke with patient about finding employment through work agencies that might offer benefits, but patient feels exploring Medicaid first is his best option. ? ?Suicidal/Homicidal: Nowithout intent/plan ? ?Therapist Response:  ? ? Intervention/Plan: LCSW administered a GAD-7.  LCSW administered a PHQ-9.  LCSW notes that patient decreased GAD-7 from an 8 to a 3.  Goal\objective is below a 5.  LCSW also notes PHQ-9 score went from a 6 to a 2 in today's session.  LCSW utilized cognitive behavioral therapy for cognitive restructuring, reframing, and educating patient on positive outlook versus negative outlook.  LCSW also utilized interventions for person centered therapy and supportive therapy for empowerment, encouragement, and praise. ? ?Plan: Return again in 4 weeks. ? ?Diagnosis: No diagnosis found. ? ?Collaboration of Care: Other Nothing in today session.  ? ?Patient/Guardian was advised Release of Information must be  obtained prior to any record release in order to collaborate their care with an outside provider. Patient/Guardian was advised if they have not already done so to contact the registration department to sign all necessary forms in order for [place] to release information regarding their care.  ? ?Consent: Patient/Guardian gives verbal consent for treatment and assignment of benefits for services provided during this visit. Patient/Guardian expressed understanding and agreed to proceed.  ? ?Weber Cooks, LCSW ?02/16/2022 ? ?

## 2022-02-23 ENCOUNTER — Ambulatory Visit (HOSPITAL_COMMUNITY): Payer: No Payment, Other | Admitting: Licensed Clinical Social Worker

## 2022-02-23 ENCOUNTER — Other Ambulatory Visit: Payer: Self-pay

## 2022-02-23 DIAGNOSIS — F3161 Bipolar disorder, current episode mixed, mild: Secondary | ICD-10-CM

## 2022-02-23 NOTE — Plan of Care (Signed)
?  Problem: Bipolar Disorder CCP Problem  1 bipolar disorder depressed  ?Goal: Decrease PHQ-9 below 10  ?Outcome: Not Progressing ?Goal: Decrease GAD-7 below 5  ?Outcome: Not Progressing ?Goal: LTG: Stabilize mood and increase goal-directed behavior: Using positive affirmation 1 x daily  ?Outcome: Progressing ?Goal: STG: Zong WILL IDENTIFY COGNITIVE PATTERNS AND BELIEFS THAT INTERFERE WITH THERAPY ?Outcome: Progressing ?Goal: STG: Jeshua WILL ATTEND AT LEAST 80% OF SCHEDULED FOLLOW-UP MEDICATION MANAGEMENT APPOINTMENTS ?Outcome: Progressing ?  ?

## 2022-02-23 NOTE — Progress Notes (Signed)
? ?  THERAPIST PROGRESS NOTE ? ?Session Time: 40 ? ?Participation Level: Active ? ?Behavioral Response: CasualAlertAnxious and Depressed ? ?Type of Therapy: Individual Therapy ? ?Treatment Goals addressed: use positive affirmation 1 x daily.  ? ?ProgressTowards Goals: Progressing ? ?Interventions: CBT, Motivational Interviewing, and Supportive ? ?Summary: Randy Braun is a 36 y.o. male who presents with depressed and anxious mood\affect.  Patient was pleasant, cooperative, maintained good eye contact.  He engaged well in therapy session and was dressed casually. ? Patient reports primary stressors as financial, illness, and addiction.  Patient reports that he is spending $148 per month on delta 8.  Patient reports that is equivalent to 2 to 45months/day.  Patient reports that he wants to quit and LCSW suggested to patient quitting by decreasing the amount he is smoking per week by 0.25 every 4 weeks.  Patient reports that he is smoking about 2 g weekly with a new goal or objective as 1.75 g weekly over the next 4 weeks or prior to next appointment.  Patient reports he is still struggling with the idea of getting 2 of his teeth removed.  Patient reports this will call us to 100s of dollars.  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  ? ? ?Suicidal/Homicidal: Nowithout intent/plan ? ?Therapist Response:  ? ? Intervention/Plan: LCSW utilized motivational interviewing for positive affirmations and open-ended questions in today's session.  LCSW utilized education for CBT therapy for signs and symptoms of bipolar disorder.  Plan for patient is to continue to utilize positive affirmations 1 time daily and to start to decrease delta 8 intake over the next 4 weeks. ? ?Plan: Return again in 4 weeks. ? ?Diagnosis: Bipolar disorder, current episode mixed, mild (HCC) ? ?Collaboration of Care: Other none in today's session.   ? ?Patient/Guardian was advised Release of Information must be obtained prior to any record release in order to collaborate their care with an outside provider. Patient/Guardian was advised if they have not already done so to contact the registration department to sign all necessary forms in order for [place] to release information regarding their care.  ? ?Consent: Patient/Guardian gives verbal consent for treatment and assignment of benefits for services provided during this visit. Patient/Guardian expressed understanding and agreed to proceed.  ? ?Weber Cooks, LCSW ?02/23/2022 ? ?

## 2022-03-04 ENCOUNTER — Telehealth (INDEPENDENT_AMBULATORY_CARE_PROVIDER_SITE_OTHER): Payer: No Payment, Other | Admitting: Physician Assistant

## 2022-03-04 DIAGNOSIS — F3161 Bipolar disorder, current episode mixed, mild: Secondary | ICD-10-CM | POA: Diagnosis not present

## 2022-03-04 DIAGNOSIS — F5104 Psychophysiologic insomnia: Secondary | ICD-10-CM | POA: Diagnosis not present

## 2022-03-04 NOTE — Progress Notes (Addendum)
BH MD/PA/NP OP Progress Note ? ?Virtual Visit via Telephone Note ? ?I connected with Randy Braun on 03/05/22 at 11:00 AM EDT by telephone and verified that I am speaking with the correct person using two identifiers. ? ?Location: ?Patient: Home ?Provider: Clinic ?  ?I discussed the limitations, risks, security and privacy concerns of performing an evaluation and management service by telephone and the availability of in person appointments. I also discussed with the patient that there may be a patient responsible charge related to this service. The patient expressed understanding and agreed to proceed. ? ?Follow Up Instructions: ? ?I discussed the assessment and treatment plan with the patient. The patient was provided an opportunity to ask questions and all were answered. The patient agreed with the plan and demonstrated an understanding of the instructions. ?  ?The patient was advised to call back or seek an in-person evaluation if the symptoms worsen or if the condition fails to improve as anticipated. ? ?I provided 17 minutes of non-face-to-face time during this encounter. ? ?Meta Hatchet, PA  ? ? ?03/05/2022 10:30 AM ?Randy Braun  ?MRN:  [id number] ? ?Chief Complaint:  ?Chief Complaint  ?Patient presents with  ? Follow-up  ? Medication Management  ? ?HPI:  ? ?Randy Braun is a 36 year old male with a past psychiatric history significant for bipolar disorder, anxiety, and insomnia who presents to [facility] via virtual telephone visit for follow-up and medication management.  Patient is currently being managed on the following medications: ? ?Abilify 10 mg daily ?Hydroxyzine 10 mg at bedtime ?Trazodone 50 mg at bedtime ? ?Patient reports that he has been experiencing mood swings less frequently since the adjustment of his Abilify from 5 mg to 10 mg daily.  Patient reports that his mood has been overall okay, despite what he is currently going through.  Patient  endorses stressors related to oral issues.  Patient reports that he has 2 cavities that he is dealing with and states that he may have to have some of his teeth extracted.  Patient reports that he is interested in adjusting his dosage of trazodone due to issues with sleep.  Patient reports that he has been waking up at odd times which she attributes to his excessive worrying.  Patient endorses drowsiness during the daytime.  Patient also endorses anxiety due to worrying about his oral surgery and the cost.  A GAD-7 screen was performed with the patient scoring a 5. ? ?Patient is alert and oriented x4, calm, cooperative, and fully engaged in conversation during the encounter.  Patient endorses okay mood.  Patient denies suicidal or homicidal ideations.  He further denies auditory or visual hallucinations and does not appear to be responding to internal/external stimuli.  Patient endorses fair sleep and receives on average 6 to 7-1/2 hours of sleep each night.  Patient endorses good appetite and eats on average 3 meals per day.  Patient denies recent alcohol consumption.  Patient denies tobacco use.  Patient endorses illicit drug use and states that he uses marijuana rolled into a tobacco leave. ? ?Visit Diagnosis:  ?  ICD-10-CM   ?1. Bipolar disorder, current episode mixed, mild (HCC)  F31.61 ARIPiprazole (ABILIFY) 10 MG tablet  ?  ?2. Psychophysiological insomnia  F51.04 traZODone (DESYREL) 50 MG tablet  ?  ? ? ?Past Psychiatric History:  ?Bipolar disorder ?Insomnia ?Anxiety ? ?Past Medical History:  ?Past Medical History:  ?Diagnosis Date  ? Anxiety   ? Depression   ?  ?  Past Surgical History:  ?Procedure Laterality Date  ? NO PAST SURGERIES    ? ? ?Family Psychiatric History:  ?Patient is unsure of family psychiatric history ? ?Family History:  ?Family History  ?Family history unknown: Yes  ? ? ?Social History:  ?Social History  ? ?Socioeconomic History  ? Marital status: Single  ?  Spouse name: Not on file  ?  Number of children: Not on file  ? Years of education: Not on file  ? Highest education level: Not on file  ?Occupational History  ? Not on file  ?Tobacco Use  ? Smoking status: Never  ? Smokeless tobacco: Never  ?Vaping Use  ? Vaping Use: Every day  ? Substances: Nicotine, THC  ?Substance and Sexual Activity  ? Alcohol use: Yes  ?  Comment: occasionally  ? Drug use: Yes  ?  Types: Marijuana  ? Sexual activity: Not on file  ?Other Topics Concern  ? Not on file  ?Social History Narrative  ? Not on file  ? ?Social Determinants of Health  ? ?Financial Resource Strain: Medium Risk  ? Difficulty of Paying Living Expenses: Somewhat hard  ?Food Insecurity: No Food Insecurity  ? Worried About Programme researcher, broadcasting/film/video in the Last Year: Never true  ? Ran Out of Food in the Last Year: Never true  ?Transportation Needs: No Transportation Needs  ? Lack of Transportation (Medical): No  ? Lack of Transportation (Non-Medical): No  ?Physical Activity: Inactive  ? Days of Exercise per Week: 0 days  ? Minutes of Exercise per Session: 0 min  ?Stress: Stress Concern Present  ? Feeling of Stress : To some extent  ?Social Connections: Socially Isolated  ? Frequency of Communication with Friends and Family: More than three times a week  ? Frequency of Social Gatherings with Friends and Family: Once a week  ? Attends Religious Services: Never  ? Active Member of Clubs or Organizations: No  ? Attends Banker Meetings: Never  ? Marital Status: Never married  ? ? ?Allergies:  ?Allergies  ?Allergen Reactions  ? Vinegar [Acetic Acid] Other (See Comments)  ?  Red Vinegar- flush  ? ? ?Metabolic Disorder Labs: ?Lab Results  ?Component Value Date  ? HGBA1C 5.8 (H) 02/27/2021  ? ?No results found for: PROLACTIN ?Lab Results  ?Component Value Date  ? CHOL 134 02/27/2021  ? TRIG 57 02/27/2021  ? HDL 55 02/27/2021  ? CHOLHDL 2.4 02/27/2021  ? LDLCALC 67 02/27/2021  ? ?Lab Results  ?Component Value Date  ? TSH 1.740 11/24/2020  ? ? ?Therapeutic  Level Labs: ?No results found for: LITHIUM ?No results found for: VALPROATE ?No components found for:  CBMZ ? ?Current Medications: ?Current Outpatient Medications  ?Medication Sig Dispense Refill  ? ARIPiprazole (ABILIFY) 10 MG tablet Take 1 tablet (10 mg total) by mouth daily. 30 tablet 1  ? baclofen (LIORESAL) 10 MG tablet Take 0.5-1 tablets (5-10 mg total) by mouth 3 (three) times daily as needed for muscle spasms. 30 each 3  ? diclofenac (VOLTAREN) 75 MG EC tablet Take 1 tablet (75 mg total) by mouth 2 (two) times daily as needed. 60 tablet 3  ? hydrOXYzine (ATARAX/VISTARIL) 10 MG tablet Take 1 tablet (10 mg total) by mouth at bedtime as needed. 60 tablet 1  ? ibuprofen (ADVIL) 800 MG tablet Take 800 mg by mouth every 8 (eight) hours as needed.    ? meloxicam (MOBIC) 7.5 MG tablet Take 7.5 mg by mouth daily.    ?  traZODone (DESYREL) 50 MG tablet Take 1.5 tablets (75 mg total) by mouth at bedtime. 45 tablet 1  ? ?No current facility-administered medications for this visit.  ? ? ? ?Musculoskeletal: ?Strength & Muscle Tone: Unable to assess due to telemedicine visit ?Gait & Station: Unable to assess due to telemedicine visit ?Patient leans: Unable to assess due to telemedicine visit ? ?Psychiatric Specialty Exam: ?Review of Systems  ?Psychiatric/Behavioral:  Positive for sleep disturbance. Negative for decreased concentration, dysphoric mood, hallucinations, self-injury and suicidal ideas. The patient is nervous/anxious. The patient is not hyperactive.    ?There were no vitals taken for this visit.There is no height or weight on file to calculate BMI.  ?General Appearance: Unable to assess due to telemedicine visit  ?Eye Contact:  Unable to assess due to telemedicine visit  ?Speech:  Clear and Coherent and Normal Rate  ?Volume:  Normal  ?Mood:  Anxious and Euthymic  ?Affect:  Appropriate and Congruent  ?Thought Process:  Coherent, Goal Directed, and Descriptions of Associations: Intact  ?Orientation:  Full (Time,  Place, and Person)  ?Thought Content: WDL   ?Suicidal Thoughts:  No  ?Homicidal Thoughts:  No  ?Memory:  Immediate;   Good ?Recent;   Good ?Remote;   Good  ?Judgement:  Good  ?Insight:  Good  ?Psychomotor Ac

## 2022-03-05 ENCOUNTER — Other Ambulatory Visit: Payer: Self-pay

## 2022-03-05 ENCOUNTER — Other Ambulatory Visit (HOSPITAL_COMMUNITY): Payer: Self-pay | Admitting: Physician Assistant

## 2022-03-05 DIAGNOSIS — F3161 Bipolar disorder, current episode mixed, mild: Secondary | ICD-10-CM

## 2022-03-05 MED ORDER — ARIPIPRAZOLE 10 MG PO TABS
10.0000 mg | ORAL_TABLET | Freq: Every day | ORAL | 1 refills | Status: DC
  Filled 2022-03-05: qty 30, 30d supply, fill #0
  Filled 2022-04-05: qty 30, 30d supply, fill #1

## 2022-03-05 MED ORDER — TRAZODONE HCL 50 MG PO TABS
75.0000 mg | ORAL_TABLET | Freq: Every day | ORAL | 1 refills | Status: DC
  Filled 2022-03-05 – 2022-03-26 (×2): qty 45, 30d supply, fill #0

## 2022-03-07 ENCOUNTER — Encounter (HOSPITAL_COMMUNITY): Payer: Self-pay | Admitting: Physician Assistant

## 2022-03-09 ENCOUNTER — Other Ambulatory Visit: Payer: Self-pay

## 2022-03-11 ENCOUNTER — Other Ambulatory Visit: Payer: Self-pay

## 2022-03-11 MED ORDER — AMOXICILLIN 500 MG PO CAPS
ORAL_CAPSULE | ORAL | 0 refills | Status: DC
  Filled 2022-03-11: qty 21, 7d supply, fill #0

## 2022-03-23 ENCOUNTER — Ambulatory Visit (INDEPENDENT_AMBULATORY_CARE_PROVIDER_SITE_OTHER): Payer: No Payment, Other | Admitting: Licensed Clinical Social Worker

## 2022-03-23 DIAGNOSIS — F3161 Bipolar disorder, current episode mixed, mild: Secondary | ICD-10-CM | POA: Diagnosis not present

## 2022-03-23 NOTE — Plan of Care (Signed)
  Problem: Bipolar Disorder CCP Problem  1 bipolar disorder depressed  Goal: LTG: Stabilize mood and increase goal-directed behavior: Using positive affirmation 1 x daily  Outcome: Progressing Goal: STG: Tyrick WILL IDENTIFY COGNITIVE PATTERNS AND BELIEFS THAT INTERFERE WITH THERAPY Outcome: Progressing Goal: STG: Nuh WILL ATTEND AT LEAST 80% OF SCHEDULED FOLLOW-UP MEDICATION MANAGEMENT APPOINTMENTS Outcome: Progressing   Problem: Bipolar Disorder CCP Problem  1 bipolar disorder depressed  Goal: Decrease PHQ-9 below 10  Outcome: Not Progressing Goal: Decrease GAD-7 below 5  Outcome: Not Progressing

## 2022-03-23 NOTE — Progress Notes (Signed)
   THERAPIST PROGRESS NOTE  Session Time: 36  Participation Level: Active  Behavioral Response: CasualAlertAnxious and Depressed  Type of Therapy: Individual Therapy  Treatment Goals addressed: LTG: Stabilize mood and increase goal-directed behavior: Using positive affirmation 1 x daily   ProgressTowards Goals: Progressing  Interventions: CBT and Motivational Interviewing  Summary: Randy Braun is a 36 y.o. male who presents with depressed and anxious mood\affect.  Patient was pleasant, cooperative, maintained good eye contact.  He was alert and oriented x5 and was dressed casually.  Patient's primary stressors as illness and work.  Patient reports that he is supposed to get a wisdom teeth pulled both his fearful of the cons of the procedure.  LCSW spoke with patient about weighing "pros and cons".  LCSW attempted to reframe and utilize different perspectives such as understanding that 80 to 90% of procedures: Well with only small percentage of the procedures that have residual effects.  Patient reports feeling guilt and remorse having his Gaffer help him with his wisdom teeth procedure.  Patient reports having a lack of communication with her about how to pay this back.  LCSW encouraged patient to have direct communication with person providing aid in order to decrease anxiety  Suicidal/Homicidal: Nowithout intent/plan  Therapist Response:    Intervention/Plan: LCSW utilized motivational interviewing for open-ended questions, reflective listening, and positive affirmations.  LCSW utilized CBT for cognitive restructuring replacing negative thoughts with +ones and looking at glasses have full and set half empty.  Plan: Return again in 4 weeks.  Diagnosis: No diagnosis found.  Collaboration of Care: Other none in today's session   Patient/Guardian was advised Release of Information must be obtained prior to any record release in order to collaborate their care  with an outside provider. Patient/Guardian was advised if they have not already done so to contact the registration department to sign all necessary forms in order for [place] to release information regarding their care.   Consent: Patient/Guardian gives verbal consent for treatment and assignment of benefits for services provided during this visit. Patient/Guardian expressed understanding and agreed to proceed.   Weber Cooks, LCSW 03/23/2022

## 2022-03-26 ENCOUNTER — Other Ambulatory Visit: Payer: Self-pay

## 2022-03-29 ENCOUNTER — Other Ambulatory Visit: Payer: Self-pay

## 2022-03-29 MED ORDER — CHLORHEXIDINE GLUCONATE 0.12 % MT SOLN
OROMUCOSAL | 0 refills | Status: DC
  Filled 2022-03-29: qty 473, 10d supply, fill #0

## 2022-03-29 MED ORDER — AMOXICILLIN 500 MG PO CAPS
ORAL_CAPSULE | ORAL | 0 refills | Status: DC
  Filled 2022-03-29: qty 21, 7d supply, fill #0

## 2022-03-29 MED ORDER — IBUPROFEN 800 MG PO TABS
800.0000 mg | ORAL_TABLET | Freq: Three times a day (TID) | ORAL | 1 refills | Status: DC | PRN
  Filled 2022-03-29: qty 20, 7d supply, fill #0

## 2022-04-05 ENCOUNTER — Other Ambulatory Visit: Payer: Self-pay

## 2022-04-20 ENCOUNTER — Ambulatory Visit (INDEPENDENT_AMBULATORY_CARE_PROVIDER_SITE_OTHER): Payer: No Payment, Other | Admitting: Licensed Clinical Social Worker

## 2022-04-20 DIAGNOSIS — F3161 Bipolar disorder, current episode mixed, mild: Secondary | ICD-10-CM

## 2022-05-06 ENCOUNTER — Telehealth (HOSPITAL_COMMUNITY): Payer: No Payment, Other | Admitting: Physician Assistant

## 2022-05-06 ENCOUNTER — Encounter (HOSPITAL_COMMUNITY): Payer: Self-pay | Admitting: Student in an Organized Health Care Education/Training Program

## 2022-05-06 ENCOUNTER — Other Ambulatory Visit: Payer: Self-pay

## 2022-05-06 ENCOUNTER — Telehealth (INDEPENDENT_AMBULATORY_CARE_PROVIDER_SITE_OTHER): Payer: No Payment, Other | Admitting: Student in an Organized Health Care Education/Training Program

## 2022-05-06 DIAGNOSIS — F3161 Bipolar disorder, current episode mixed, mild: Secondary | ICD-10-CM | POA: Diagnosis not present

## 2022-05-06 DIAGNOSIS — F5105 Insomnia due to other mental disorder: Secondary | ICD-10-CM

## 2022-05-06 DIAGNOSIS — F411 Generalized anxiety disorder: Secondary | ICD-10-CM | POA: Diagnosis not present

## 2022-05-06 DIAGNOSIS — F5104 Psychophysiologic insomnia: Secondary | ICD-10-CM

## 2022-05-06 DIAGNOSIS — F99 Mental disorder, not otherwise specified: Secondary | ICD-10-CM

## 2022-05-06 MED ORDER — HYDROXYZINE HCL 10 MG PO TABS
10.0000 mg | ORAL_TABLET | Freq: Three times a day (TID) | ORAL | 2 refills | Status: DC | PRN
Start: 2022-05-06 — End: 2023-04-11
  Filled 2022-05-06: qty 90, 30d supply, fill #0

## 2022-05-06 MED ORDER — ARIPIPRAZOLE 10 MG PO TABS
10.0000 mg | ORAL_TABLET | Freq: Every day | ORAL | 1 refills | Status: DC
  Filled 2022-05-06: qty 30, 30d supply, fill #0
  Filled 2022-06-03: qty 30, 30d supply, fill #1

## 2022-05-06 MED ORDER — TRAZODONE HCL 50 MG PO TABS
75.0000 mg | ORAL_TABLET | Freq: Every day | ORAL | 1 refills | Status: DC
  Filled 2022-05-06: qty 45, 30d supply, fill #0
  Filled 2022-06-03: qty 45, 30d supply, fill #1

## 2022-05-06 NOTE — Progress Notes (Signed)
BH MD/PA/NP OP Progress Note  05/06/2022 8:17 AM Gabrial Domine  MRN:  [id number]  Chief Complaint: No chief complaint on file.  Virtual Visit via Telephone Note  I connected with Justyn Langham on 05/06/22 at  8:00 AM EDT by telephone and verified that I am speaking with the correct person using two identifiers.  Location: Patient: Home Provider: Office   I discussed the limitations, risks, security and privacy concerns of performing an evaluation and management service by telephone and the availability of in person appointments. I also discussed with the patient that there may be a patient responsible charge related to this service. The patient expressed understanding and agreed to proceed.   History of Present Illness:   Jahlen Nill is a 36 year old male with a past psychiatric history significant for bipolar disorder, anxiety, and insomnia who presents to [facility] via virtual telephone visit for follow-up and medication management.  Patient is currently being managed on the following medications:  Abilify 10 mg daily Hydroxyzine 10 mg PRN Trazodone 75 mg at bedtime  Patient provider initially had difficulty hearing one another however this eventually resolved.  Patient reports that he has been having a hard time with wife lately.  Patient reports that he is currently in "survival mode" and that he does not have much to do that brings him joy or have fun.  Patient reports that he does not like his job and he constantly feels frustrated by others at his job either actions.  Patient endorses that he feels a bit irritable but does not endorse lashing out.  Patient reports that he has resorted to smoking at least 1 joint a day to deal with his dysphoric mood.  Patient reports that his sleep continues to be poor and endorses poor sleep hygiene.  Patient reports that he will often and be on his laptop immediately prior to falling asleep "looking  for the next thing" on Twitter or another place of the Internet.  Patient and provider discussed better sleep hygiene and patient endorsed understanding and reported that he would attempt to separate his work and rest.  Despite endorsing difficulty falling asleep, patient did report getting at least 6-7 hours a night although they were not restful and he still wakes up tired in the morning.  Patient did endorse that he felt the trazodone 75 mg was not beneficial and he therefore wanted to go back to 50 mg and has been taking 50 mg the last few weeks.  He  Patient endorses anhedonia, feeling depressed, not having good energy but having decent concentration and appetite.  Patient denies SI, HI and AVH.  Patient and provider discuss patient starting Lamictal.  Provider did go over with patient black box warning of SJS.  Patient endorses that this time he would rather have his hydroxyzine refilled as he feels that this helps his mood and he has not been taking it the last few weeks due to it expiring.  Patient reports that generally hydroxyzine leads to him having less rheumatoid thoughts and he is less irritable.  Patient reports however, that he would like for Lamictal to remain an option in the future should he continue to have depressed mood despite restarting hydroxyzine.     I discussed the assessment and treatment plan with the patient. The patient was provided an opportunity to ask questions and all were answered. The patient agreed with the plan and demonstrated an understanding of the instructions.   The patient was  advised to call back or seek an in-person evaluation if the symptoms worsen or if the condition fails to improve as anticipated.  I provided 30 minutes of non-face-to-face time during this encounter.  PGY-3 [name], MD    Visit Diagnosis: No diagnosis found.  Past Psychiatric History: Bipolar disorder Insomnia Anxiety  Past Medical History:  Past Medical History:   Diagnosis Date   Anxiety    Depression     Past Surgical History:  Procedure Laterality Date   NO PAST SURGERIES      Family Psychiatric History: Patient unsure  Family History:  Family History  Family history unknown: Yes    Social History:  Social History   Socioeconomic History   Marital status: Single    Spouse name: Not on file   Number of children: Not on file   Years of education: Not on file   Highest education level: Not on file  Occupational History   Not on file  Tobacco Use   Smoking status: Never   Smokeless tobacco: Never  Vaping Use   Vaping Use: Every day   Substances: Nicotine, THC  Substance and Sexual Activity   Alcohol use: Yes    Comment: occasionally   Drug use: Yes    Types: Marijuana   Sexual activity: Not on file  Other Topics Concern   Not on file  Social History Narrative   Not on file   Social Determinants of Health   Financial Resource Strain: Medium Risk (04/28/2021)   Overall Financial Resource Strain (CARDIA)    Difficulty of Paying Living Expenses: Somewhat hard  Food Insecurity: No Food Insecurity (04/28/2021)   Hunger Vital Sign    Worried About Running Out of Food in the Last Year: Never true    Ran Out of Food in the Last Year: Never true  Transportation Needs: No Transportation Needs (04/28/2021)   PRAPARE - Administrator, Civil Service (Medical): No    Lack of Transportation (Non-Medical): No  Physical Activity: Inactive (04/28/2021)   Exercise Vital Sign    Days of Exercise per Week: 0 days    Minutes of Exercise per Session: 0 min  Stress: Stress Concern Present (04/28/2021)   [employer] of Occupational Health - Occupational Stress Questionnaire    Feeling of Stress : To some extent  Social Connections: Socially Isolated (04/28/2021)   Social Connection and Isolation Panel [NHANES]    Frequency of Communication with Friends and Family: More than three times a week    Frequency of Social Gatherings  with Friends and Family: Once a week    Attends Religious Services: Never    Database administrator or Organizations: No    Attends Banker Meetings: Never    Marital Status: Never married    Allergies:  Allergies  Allergen Reactions   Vinegar [Acetic Acid] Other (See Comments)    Red Vinegar- flush    Metabolic Disorder Labs: Lab Results  Component Value Date   HGBA1C 5.8 (H) 02/27/2021   No results found for: "PROLACTIN" Lab Results  Component Value Date   CHOL 134 02/27/2021   TRIG 57 02/27/2021   HDL 55 02/27/2021   CHOLHDL 2.4 02/27/2021   LDLCALC 67 02/27/2021   Lab Results  Component Value Date   TSH 1.740 11/24/2020    Therapeutic Level Labs: No results found for: "LITHIUM" No results found for: "VALPROATE" No results found for: "CBMZ"  Current Medications: Current Outpatient Medications  Medication  Sig Dispense Refill   amoxicillin (AMOXIL) 500 MG capsule Take 1 capsule by mouth once every 8 hours until finished. 21 capsule 0   ARIPiprazole (ABILIFY) 10 MG tablet Take 1 tablet (10 mg total) by mouth daily. 30 tablet 1   baclofen (LIORESAL) 10 MG tablet Take 0.5-1 tablets (5-10 mg total) by mouth 3 (three) times daily as needed for muscle spasms. 30 each 3   chlorhexidine (PERIDEX) 0.12 % solution Swish for one minute and expectorate twice daily for 10 days. 473 mL 0   diclofenac (VOLTAREN) 75 MG EC tablet Take 1 tablet (75 mg total) by mouth 2 (two) times daily as needed. 60 tablet 3   hydrOXYzine (ATARAX/VISTARIL) 10 MG tablet Take 1 tablet (10 mg total) by mouth at bedtime as needed. 60 tablet 1   ibuprofen (ADVIL) 800 MG tablet Take 800 mg by mouth every 8 (eight) hours as needed.     ibuprofen (IBU) 800 MG tablet Take 1 tablet by mouth once every 8 hours as needed for pain. 20 tablet 1   meloxicam (MOBIC) 7.5 MG tablet Take 7.5 mg by mouth daily.     traZODone (DESYREL) 50 MG tablet Take 1.5 tablets (75 mg total) by mouth once nightly at  bedtime. 45 tablet 1   No current facility-administered medications for this visit.     Musculoskeletal: Defer Psychiatric Specialty Exam: Review of Systems  Psychiatric/Behavioral:  Positive for sleep disturbance. Negative for behavioral problems, hallucinations and suicidal ideas. The patient is nervous/anxious. The patient is not hyperactive.     There were no vitals taken for this visit.There is no height or weight on file to calculate BMI.  General Appearance: Defer  Eye Contact: Defer  Speech:  Clear and Coherent  Volume:  Normal  Mood:  Dysphoric  Affect:  NA  Thought Process:  Goal Directed  Orientation:  Full (Time, Place, and Person)  Thought Content: Logical   Suicidal Thoughts:  No  Homicidal Thoughts:  No  Memory:  Immediate;   Good Recent;   Good  Judgement:  Fair  Insight:  Present  Psychomotor Activity:  NA  Concentration:  Concentration: Fair  Recall:  NA  Fund of Knowledge: Fair  Language: Good  Akathisia:  Negative    AIMS (if indicated): not done  Assets:  Communication Skills Desire for Improvement Housing Resilience  ADL's:  Intact  Cognition: WNL  Sleep:  Fair   Screenings: GAD-7    Flowsheet Row Video Visit from 03/04/2022 in [facility] Counselor from 02/16/2022 in [facility] Counselor from 01/26/2022 in [facility] Video Visit from 12/31/2021 in [facility] Video Visit from 11[facility]05/2021 in [facility]  Total GAD-7 Score 5 3 8 6 8       PHQ2-9    Flowsheet Row Video Visit from 03/04/2022 in [facility] Counselor from 02/16/2022 in [facility] Counselor from 01/26/2022 in [facility] Video Visit from 12/31/2021 in [facility] Video Visit from 11/05/2021 in [facility]   PHQ-2 Total Score 0 0 3 0 2  PHQ-9 Total Score -- 2 6 -- 7      Flowsheet Row Video Visit from 03/04/2022 in [facility] Video Visit from 12/31/2021 in [facility] Video Visit from 11/05/2021 in [facility]  C-SSRS RISK CATEGORY  Low Risk Low Risk Moderate Risk        Assessment and Plan:  Based on assessment today patient appears to be currently expressing symptoms of being an depressive episode.  Discussion was had with patient about considering Lamictal due to depressive symptoms; however through sheered patient decision making it was decided the patient's hydroxyzine would be refilled and that Lamictal may be considered at next appointment if symptoms persist.  Patient also did not wish to have small increase in Abilify at this time.  Patient's trazodone was also not increased as he endorsed decreased it to 50 mg and sleeping 6-7 hours; sleep hygiene was discussed with patient as this may be contributing to patient's poor quality of sleep.  Bipolar disorder, current episode depressed - Continue Abilify 10 mg daily - Consider Lamictal 25 mg at next appointment - Restart hydroxyzine 10 mg 3 times daily as needed  Insomnia - Improve sleep hygiene - Decrease trazodone back to 50 mg nightly  Collaboration of Care: Collaboration of Care:  Patient/Guardian was advised Release of Information must be obtained prior to any record release in order to collaborate their care with an outside provider. Patient/Guardian was advised if they have not already done so to contact the registration department to sign all necessary forms in order for [place] to release information regarding their care.   Consent: Patient/Guardian gives verbal consent for treatment and assignment of benefits for services provided during this visit. Patient/Guardian expressed understanding and agreed to proceed.   PGY-3 [name],  MD 05/06/2022, 8:17 AM

## 2022-05-07 ENCOUNTER — Other Ambulatory Visit: Payer: Self-pay

## 2022-05-10 ENCOUNTER — Ambulatory Visit (HOSPITAL_COMMUNITY): Payer: No Payment, Other | Admitting: Licensed Clinical Social Worker

## 2022-06-02 ENCOUNTER — Ambulatory Visit (INDEPENDENT_AMBULATORY_CARE_PROVIDER_SITE_OTHER): Payer: No Payment, Other | Admitting: Licensed Clinical Social Worker

## 2022-06-02 DIAGNOSIS — F5105 Insomnia due to other mental disorder: Secondary | ICD-10-CM

## 2022-06-02 DIAGNOSIS — F3161 Bipolar disorder, current episode mixed, mild: Secondary | ICD-10-CM | POA: Diagnosis not present

## 2022-06-02 NOTE — Progress Notes (Addendum)
   THERAPIST PROGRESS NOTE  Session Time: 2  Participation Level: Active  Behavioral Response: CasualAlertAnxious and Depressed  Type of Therapy: Individual Therapy   ProgressTowards Goals: Revised Treatment plan updated   Interventions: CBT, Motivational Interviewing, and Supportive  Summary: Randy Braun is a 36 y.o. male who presents with depressed and anxious mood/affect. Pt was pleasant, cooperative, and maintained good eye contact. Randy Braun was alert and oriented x5.   Pt primary stressors is work and marijuana use. Pt reports that his boss has been making him work up to 6 days in a row. He reports that this is due to lack of staffing. Pt reports that he is having recurring nightmares about his job he has been working so much. Randy Braun also reports nightmares about his uncle that taught him to drive. Pt reports feeling stressed about driving because a lot of jobs require driver's license. Pt reports feeling "stuck and complacent".    Suicidal/Homicidal: Nowithout intent/plan  Therapist Response:    Intervention/Plan: Intervention used today were for psychoanalytic therapy for pt to express thoughts, feeling and emotions. LCSW educated pt on plan for applying for job and following up 1 week later with the job he applied for the week prior. For example, pt to apply too 2 jobs daily. Following week, he continues to apply to 2 jobs daily but also follows up with two jobs he applied for the week prior. Pt was agreeable to plan. LCSW used educated on the signs and symptoms of bipolar depression.    Plan: Return again in 3 weeks.  Diagnosis: Bipolar disorder, current episode mixed, mild (HCC)  Insomnia due to other mental disorder  Collaboration of Care: Other None today   Patient/Guardian was advised Release of Information must be obtained prior to any record release in order to collaborate their care with an outside provider. Patient/Guardian was advised if they have not  already done so to contact the registration department to sign all necessary forms in order for [place] to release information regarding their care.   Consent: Patient/Guardian gives verbal consent for treatment and assignment of benefits for services provided during this visit. Patient/Guardian expressed understanding and agreed to proceed.   Weber Cooks, LCSW 06/02/2022

## 2022-06-02 NOTE — Plan of Care (Signed)
Treatment plan updated

## 2022-06-03 ENCOUNTER — Other Ambulatory Visit: Payer: Self-pay

## 2022-07-06 ENCOUNTER — Other Ambulatory Visit: Payer: Self-pay

## 2022-07-06 ENCOUNTER — Other Ambulatory Visit (HOSPITAL_COMMUNITY): Payer: Self-pay

## 2022-07-07 ENCOUNTER — Other Ambulatory Visit: Payer: Self-pay

## 2022-07-07 ENCOUNTER — Other Ambulatory Visit (HOSPITAL_COMMUNITY): Payer: Self-pay | Admitting: Student in an Organized Health Care Education/Training Program

## 2022-07-07 ENCOUNTER — Telehealth (HOSPITAL_COMMUNITY): Payer: Self-pay | Admitting: Student in an Organized Health Care Education/Training Program

## 2022-07-07 ENCOUNTER — Ambulatory Visit (INDEPENDENT_AMBULATORY_CARE_PROVIDER_SITE_OTHER): Payer: No Payment, Other | Admitting: Licensed Clinical Social Worker

## 2022-07-07 DIAGNOSIS — F5105 Insomnia due to other mental disorder: Secondary | ICD-10-CM

## 2022-07-07 DIAGNOSIS — F3161 Bipolar disorder, current episode mixed, mild: Secondary | ICD-10-CM

## 2022-07-07 MED ORDER — ARIPIPRAZOLE 10 MG PO TABS
10.0000 mg | ORAL_TABLET | Freq: Every day | ORAL | 1 refills | Status: DC
  Filled 2022-07-07: qty 30, 30d supply, fill #0
  Filled 2022-08-05: qty 9, 9d supply, fill #1
  Filled 2022-08-06: qty 21, 21d supply, fill #1

## 2022-07-07 NOTE — Plan of Care (Signed)
  Problem: Bipolar Disorder CCP Problem  1 bipolar disorder depressed  Goal: STG: Dandre WILL IDENTIFY COGNITIVE PATTERNS AND BELIEFS THAT INTERFERE WITH THERAPY Outcome: Progressing Goal: STG: Nolberto WILL ATTEND AT LEAST 80% OF SCHEDULED FOLLOW-UP MEDICATION MANAGEMENT APPOINTMENTS Outcome: Progressing Goal:  Decrease marijuana use to 0/7 days per week  Outcome: Progressing

## 2022-07-07 NOTE — Progress Notes (Signed)
   THERAPIST PROGRESS NOTE  Session Time: 107  Participation Level: Active  Behavioral Response: CasualAlertAnxious and Depressed  Type of Therapy: Individual Therapy  Treatment Goals addressed: Decrease marijuana use to 0/7 days per week   ProgressTowards Goals: Progressing  Interventions: CBT, Motivational Interviewing, and Supportive  Summary: Randy Braun is a 36 y.o. male who presents with a significant anxious and depressed mood\affect.  Patient was pleasant, cooperative, maintained good eye contact.  He engaged well in therapy session was dressed casually.  Patient was alert and oriented x5.   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  Patient reports that he has been attempting to apply for multiple jobs per week but has only gone on interview.  Patient reports that it was indicated to him that the job that he had an interview for was going to offer him the job but then the next day he got an automated email stating that he was no longer a candidate for that job.  Patient to continue to apply to at least 7 jobs weekly.  Patient reports he is stressed financially due to the lack of income he is getting at his current job.  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  Patient reports decrease in marijuana use to 0 out of 7 days/week all but 1 week where he felt depressed and anxious due to one of his favorite wrestlers passed away.  Suicidal/Homicidal: Nowithout intent/plan  Therapist Response:     Intervention/Plan: LCSW offered solution focused therapy stating to continue to apply to at least 1 job weekly and follow-up with 1 job daily that he has applied for.  LCSW used supportive language for praise and encouragement.  LCSW used reframing for CBT therapy.  LCSW educated patient on the benefits of  abstaining from marijuana use.  LCSW used empowerment and unconditional positive regard for person centered therapy.  CSW use psychoanalytic therapy for patient to express thoughts, feelings, and concerns.     Plan: Return again in 3 weeks.  Diagnosis: Bipolar disorder, current episode mixed, mild (HCC)  Insomnia due to other mental disorder  Collaboration of Care: Other None today   Patient/Guardian was advised Release of Information must be obtained prior to any record release in order to collaborate their care with an outside provider. Patient/Guardian was advised if they have not already done so to contact the registration department to sign all necessary forms in order for [place] to release information regarding their care.   Consent: Patient/Guardian gives verbal consent for treatment and assignment of benefits for services provided during this visit. Patient/Guardian expressed understanding and agreed to proceed.   Weber Cooks, LCSW 07/07/2022

## 2022-07-07 NOTE — Progress Notes (Signed)
Refill for Abilify 10mg  sent to pharamcy. Have asked patient to be rescheduled for a follow-up.   PGY-3 , MD

## 2022-07-09 ENCOUNTER — Other Ambulatory Visit: Payer: Self-pay

## 2022-07-28 ENCOUNTER — Ambulatory Visit (INDEPENDENT_AMBULATORY_CARE_PROVIDER_SITE_OTHER): Payer: No Payment, Other | Admitting: Licensed Clinical Social Worker

## 2022-07-28 DIAGNOSIS — F3161 Bipolar disorder, current episode mixed, mild: Secondary | ICD-10-CM

## 2022-07-28 NOTE — Progress Notes (Signed)
   THERAPIST PROGRESS NOTE  Session Time: 15  Participation Level: Active  Behavioral Response: CasualAlertAngry and Anxious  Type of Therapy: Individual Therapy  Treatment Goals addressed: Decrease marijuana use to 0/7 days per week   ProgressTowards Goals: Progressing  Interventions: Motivational Interviewing and Strength-based  Summary: Randy Braun is a 36 y.o. male who presents with depressed and anxious mood\affect.  Patient was pleasant, cooperative, maintained good eye contact.  He engaged well in therapy session was dressed casually.  Patient was alert and oriented x5.  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  Patient reports an overall decrease in marijuana use minus a few cheat days.  Patient states that his goal at this month is to go to 4 weeks without smoking marijuana from either a smoke shop or off the streets.  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  Patient reports that he continues to look for employment but feels like he is getting "plate".  Patient reports 1 job interview where the interviewer shook his hand and told him to expect a phone call, we will need to get an email back the next day stating the job had been filled.  Patient also reports calling back a potential interview less than 24 hours later and the job early being filled.  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  Patient endorses symptoms for worthlessness, homelessness, restlessness, tension, and worry.  Patient reports other stressors as illness as one of his molars needs a partial root canal.  Patient states that he has an uncle that is willing to help him but patient is concerned and has not overall fear of surgical outcome being poor.  Suicidal/Homicidal: without intent/plan  Therapist Response:    Intervention/Plan: LCSW  utilized interventions for supportive therapy, CBT, reframing, and motivational interviewing.  LCSW educated patient on taking medications as prescribed.  LCSW utilized supportive therapy for praise and encouragement.  LCSW utilized motivational interviewing for open-ended questions, positive affirmations, reflective listening.  LCSW used CBT therapy for cognitive restructuring.  Plan: Return again in 3 weeks.  Diagnosis: Bipolar disorder, current episode mixed, mild ([place])  Collaboration of Care: Other None today.  Patient/Guardian was advised Release of Information must be obtained prior to any record release in order to collaborate their care with an outside provider. Patient/Guardian was advised if they have not already done so to contact the registration department to sign all necessary forms in order for [place] to release information regarding their care.   Consent: Patient/Guardian gives verbal consent for treatment and assignment of benefits for services provided during this visit. Patient/Guardian expressed understanding and agreed to proceed.   Dory Horn, LCSW 07/28/2022

## 2022-07-28 NOTE — Plan of Care (Signed)
  Problem: Bipolar Disorder CCP Problem  1 bipolar disorder depressed  Goal: LTG: Stabilize mood and increase goal-directed behavior: Using positive affirmation 1 x daily  Outcome: Progressing Goal: STG: Ashur WILL IDENTIFY COGNITIVE PATTERNS AND BELIEFS THAT INTERFERE WITH THERAPY Outcome: Progressing Goal: STG: Amr WILL ATTEND AT LEAST 80% OF SCHEDULED FOLLOW-UP MEDICATION MANAGEMENT APPOINTMENTS Outcome: Progressing Goal:  Decrease marijuana use to 0/7 days per week  Outcome: Progressing   Problem: Bipolar Disorder CCP Problem  1 bipolar disorder depressed  Goal: Decrease PHQ-9 below 10  Outcome: Not Progressing Goal: Decrease GAD-7 below 5  Outcome: Not Progressing

## 2022-08-03 ENCOUNTER — Other Ambulatory Visit: Payer: Self-pay

## 2022-08-05 ENCOUNTER — Other Ambulatory Visit (HOSPITAL_COMMUNITY): Payer: Self-pay

## 2022-08-05 ENCOUNTER — Other Ambulatory Visit: Payer: Self-pay

## 2022-08-06 ENCOUNTER — Other Ambulatory Visit: Payer: Self-pay

## 2022-08-06 ENCOUNTER — Telehealth (HOSPITAL_COMMUNITY): Payer: Self-pay | Admitting: Student in an Organized Health Care Education/Training Program

## 2022-08-06 ENCOUNTER — Other Ambulatory Visit (HOSPITAL_COMMUNITY): Payer: Self-pay | Admitting: Student in an Organized Health Care Education/Training Program

## 2022-08-06 DIAGNOSIS — F3161 Bipolar disorder, current episode mixed, mild: Secondary | ICD-10-CM

## 2022-08-06 DIAGNOSIS — F5104 Psychophysiologic insomnia: Secondary | ICD-10-CM

## 2022-08-06 MED ORDER — ARIPIPRAZOLE 10 MG PO TABS: 10.0000 mg | ORAL_TABLET | Freq: Every day | ORAL | 1 refills | Status: DC

## 2022-08-06 MED ORDER — TRAZODONE HCL 50 MG PO TABS
75.0000 mg | ORAL_TABLET | Freq: Every day | ORAL | 1 refills | Status: DC
  Filled 2022-08-06: qty 45, 30d supply, fill #0

## 2022-08-06 NOTE — Telephone Encounter (Signed)
I will provide a refill, but patient needs a new appt with this provider to be scheduled.

## 2022-08-06 NOTE — Telephone Encounter (Signed)
Patient is requesting a refill of ARIPiprazole (ABILIFY) 10 MG tablet and traZODone (DESYREL) 50 MG tablet  [place] Phone:  [phone]  Fax:  [phone]     Abilify Last ordered: 07/07/21   Trazodone Last ordered: 05/06/22  Last Visit: 05/06/22

## 2022-08-09 ENCOUNTER — Other Ambulatory Visit: Payer: Self-pay

## 2022-08-10 ENCOUNTER — Other Ambulatory Visit: Payer: Self-pay

## 2022-08-24 ENCOUNTER — Ambulatory Visit (INDEPENDENT_AMBULATORY_CARE_PROVIDER_SITE_OTHER): Payer: No Payment, Other | Admitting: Licensed Clinical Social Worker

## 2022-08-24 DIAGNOSIS — F3161 Bipolar disorder, current episode mixed, mild: Secondary | ICD-10-CM | POA: Diagnosis not present

## 2022-08-24 NOTE — Progress Notes (Signed)
   THERAPIST PROGRESS NOTE  Session Time: 13   Participation Level: Active  Behavioral Response: CasualAlertAnxious and Depressed  Type of Therapy: Individual Therapy  Treatment Goals addressed:   ProgressTowards Goals: Progressing  Interventions: CBT, Motivational Interviewing, and Supportive  Summary: Randy Braun is a 36 y.o. male who presents with depressed and anxious mood\affect.  Patient was pleasant, cooperative, maintained good eye contact.  He engaged well in therapy session was dressed casually.  Patient reports primary stressors today as financial, work, and medication management.  Patient states that he has been having to call appear every single month to get refills on his medications for noncontrolled substances.  Patient reports that he has not been seen by medication provider since July.  LCSW utilized solution focused therapy in today's session by messaging front desk administrative staff to schedule appointment with medication provider at [facility].  LCSW also advised patient that as long as medications were noncontrolled substances he could ask for multiple refills on his medications.  Other stressors for patient are work.  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  Patient reports not being happy at his current job.  Patient reports good news of applying and obtaining an interview with [employer].  Suicidal/Homicidal: Nowithout intent/plan  Therapist Response:     Interventions\plan: LCSW did mock interview with patient.  LCSW reviewed strengths and weaknesses for employment.  LCSW spoke with patient about communication skills such as nonverbal with posture and facial expressions.  LCSW utilized supportive therapy for praise and encouragement.  LCSW utilized psycho analytic therapy for patient to express thoughts, feelings, and concerns.  Plan for patient is to write down strengths of every job that he had  along with the weaknesses of every job he has had.  Patient to review those strengths and weaknesses and how they can be applied to interview tomorrow and also for patient to change his weaknesses into positives.  Plan: Return again in 3 weeks.  Diagnosis: Bipolar disorder, current episode mixed, mild ([place])  Collaboration of Care: Other    Patient/Guardian was advised Release of Information must be obtained prior to any record release in order to collaborate their care with an outside provider. Patient/Guardian was advised if they have not already done so to contact the registration department to sign all necessary forms in order for [place] to release information regarding their care.   Consent: Patient/Guardian gives verbal consent for treatment and assignment of benefits for services provided during this visit. Patient/Guardian expressed understanding and agreed to proceed.   Dory Horn, LCSW 08/24/2022

## 2022-09-07 ENCOUNTER — Other Ambulatory Visit: Payer: Self-pay

## 2022-09-07 ENCOUNTER — Telehealth (HOSPITAL_COMMUNITY): Payer: Self-pay | Admitting: Student in an Organized Health Care Education/Training Program

## 2022-09-07 ENCOUNTER — Ambulatory Visit (HOSPITAL_COMMUNITY): Payer: Self-pay | Admitting: Licensed Clinical Social Worker

## 2022-09-07 ENCOUNTER — Other Ambulatory Visit (HOSPITAL_COMMUNITY): Payer: Self-pay | Admitting: Student in an Organized Health Care Education/Training Program

## 2022-09-07 DIAGNOSIS — F3161 Bipolar disorder, current episode mixed, mild: Secondary | ICD-10-CM

## 2022-09-07 MED ORDER — ARIPIPRAZOLE 10 MG PO TABS
10.0000 mg | ORAL_TABLET | Freq: Every day | ORAL | 1 refills | Status: DC
  Filled 2022-09-07: qty 30, 30d supply, fill #0

## 2022-09-07 NOTE — Progress Notes (Unsigned)
   THERAPIST PROGRESS NOTE  Session Time: 62   Participation Level: Active  Behavioral Response: CasualAlertAnxious and Depressed  Type of Therapy: Individual Therapy  Treatment Goals addressed: Problem: Bipolar Disorder CCP Problem  1 bipolar disorder depressed  Goal: STG: Tajh WILL ATTEND AT LEAST 80% OF SCHEDULED FOLLOW-UP MEDICATION MANAGEMENT APPOINTMENTS Outcome: Completed/Met   Interventions: Motivational Interviewing and Supportive   Suicidal/Homicidal: Nowithout intent/plan  Therapist Response:    Pt was alert and oriented x 5. He was dressed casually and engaged well in therapy session. He presented with depressed and anxious mood/affect. He was pleasant, cooperative, and maintained good eye contact.   Pt reports primary stressor as work. He states that he has struggled with finding work as he is not happy with his current pay rate or job at his current place of employment. Lamberto states that he did get an interview at [facility], however pt reports that he was only guaranteed 5 to 15 hours per week part time. He could not work full time as he would have to have availability at [employer] and he take the bus.  Pt reports he is struggling with his sobriety as well as pt states he smoke 2 grams of delta 9 over the past week. He reports relapse due to stress, tension, and worry.   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 If pt can complete the training, he would like to move forward with that opportunity as this was offered to him prior. Pt also to continue to apply to 10 jobs weekly and f/u with 10 jobs weekly. LCSW educated pt on taking medication as prescribed. LCSW used supportive therapy for praise and encouragement. LCSW used psychoanalytic therapy for pt to express, thoughts, feelings, and emotions. LCSW used motivational interviewing for open ended questions, positive affirmations,  and reflective listening.   Plan: Return again in 3 weeks.  Diagnosis: Bipolar disorder, current episode mixed, mild ([place])  Collaboration of Care: Other None today   Patient/Guardian was advised Release of Information must be obtained prior to any record release in order to collaborate their care with an outside provider. Patient/Guardian was advised if they have not already done so to contact the registration department to sign all necessary forms in order for [place] to release information regarding their care.   Consent: Patient/Guardian gives verbal consent for treatment and assignment of benefits for services provided during this visit. Patient/Guardian expressed understanding and agreed to proceed.   Dory Horn, LCSW 09/07/2022

## 2022-09-07 NOTE — Plan of Care (Signed)
  Problem: Bipolar Disorder CCP Problem  1 bipolar disorder depressed  Goal: LTG: Stabilize mood and increase goal-directed behavior: Using positive affirmation 1 x daily  Outcome: Progressing Goal: STG: Avary WILL IDENTIFY COGNITIVE PATTERNS AND BELIEFS THAT INTERFERE WITH THERAPY Outcome: Progressing   Problem: Bipolar Disorder CCP Problem  1 bipolar disorder depressed  Goal:  Decrease marijuana use to 0/7 days per week  Outcome: Not Progressing Note: Pt smoked 2 grams over the past two weeks due to increased stress.    Problem: Bipolar Disorder CCP Problem  1 bipolar disorder depressed  Goal: STG: Alcario WILL ATTEND AT LEAST 80% OF SCHEDULED FOLLOW-UP MEDICATION MANAGEMENT APPOINTMENTS Outcome: Completed/Met

## 2022-09-08 ENCOUNTER — Other Ambulatory Visit: Payer: Self-pay

## 2022-09-09 ENCOUNTER — Telehealth (INDEPENDENT_AMBULATORY_CARE_PROVIDER_SITE_OTHER): Payer: No Payment, Other | Admitting: Student in an Organized Health Care Education/Training Program

## 2022-09-09 ENCOUNTER — Other Ambulatory Visit: Payer: Self-pay

## 2022-09-09 ENCOUNTER — Encounter (HOSPITAL_COMMUNITY): Payer: Self-pay | Admitting: Student in an Organized Health Care Education/Training Program

## 2022-09-09 DIAGNOSIS — F411 Generalized anxiety disorder: Secondary | ICD-10-CM

## 2022-09-09 DIAGNOSIS — F3161 Bipolar disorder, current episode mixed, mild: Secondary | ICD-10-CM

## 2022-09-09 DIAGNOSIS — F331 Major depressive disorder, recurrent, moderate: Secondary | ICD-10-CM | POA: Diagnosis not present

## 2022-09-09 DIAGNOSIS — F3132 Bipolar disorder, current episode depressed, moderate: Secondary | ICD-10-CM

## 2022-09-09 DIAGNOSIS — F5104 Psychophysiologic insomnia: Secondary | ICD-10-CM | POA: Diagnosis not present

## 2022-09-09 MED ORDER — TRAZODONE HCL 50 MG PO TABS
50.0000 mg | ORAL_TABLET | Freq: Every day | ORAL | 3 refills | Status: DC
  Filled 2022-09-09: qty 30, 30d supply, fill #0
  Filled 2022-11-09: qty 30, 30d supply, fill #1

## 2022-09-09 MED ORDER — BUPROPION HCL ER (XL) 150 MG PO TB24
150.0000 mg | ORAL_TABLET | ORAL | 3 refills | Status: DC
  Filled 2022-09-09: qty 30, 30d supply, fill #0
  Filled 2022-10-12: qty 30, 30d supply, fill #1
  Filled 2022-11-09 (×2): qty 30, 30d supply, fill #2

## 2022-09-09 MED ORDER — ARIPIPRAZOLE 10 MG PO TABS
10.0000 mg | ORAL_TABLET | Freq: Every day | ORAL | 3 refills | Status: DC
  Filled 2022-09-09 – 2022-10-05 (×2): qty 30, 30d supply, fill #0
  Filled 2022-11-03: qty 30, 30d supply, fill #1

## 2022-09-09 NOTE — Progress Notes (Signed)
BH MD/PA/NP OP Progress Note  09/09/2022 3:48 PM Randy Braun  MRN:  [id number]  Chief Complaint:  Chief Complaint  Patient presents with   Follow-up   Virtual Visit via Telephone Note  I connected with Randy Braun on 09/09/22 at 10:00 AM EST by telephone and verified that I am speaking with the correct person using two identifiers.  Location: Patient: Home Provider: Office   I discussed the limitations, risks, security and privacy concerns of performing an evaluation and management service by telephone and the availability of in person appointments. I also discussed with the patient that there may be a patient responsible charge related to this service. The patient expressed understanding and agreed to proceed.       I discussed the assessment and treatment plan with the patient. The patient was provided an opportunity to ask questions and all were answered. The patient agreed with the plan and demonstrated an understanding of the instructions.   The patient was advised to call back or seek an in-person evaluation if the symptoms worsen or if the condition fails to improve as anticipated.  I provided 30 minutes of non-face-to-face time during this encounter.   Freida Busman, MD   HPI: Randy Braun is a 36 year old male with a past psychiatric history significant for bipolar disorder, anxiety, and insomnia who presents to [facility] via virtual telephone visit for follow-up and medication management.  Patient is currently being managed on the following medications:   Abilify 10mg  daily Hydroxyzine 10mg  TID PRN Trazodone 50mg  QHS  Patient reports that he has not been doing well lately, because he does not like his job. Patient reports that he feels his job makes him more anxious and at times irritable. 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 Patient reports that he  does not believe he is having any somatization of symptoms nor has he had any panic attacks lately.  Patient reports that overall he just feels his mood is more dysphoric and that he is feeling "hopeless." Patient endorses some anhedonia, and low energy. Patient reports that he is sleeping well with 50mg  Trazodone and no longer requires 75mg . Patient reports that he also feels his appetite is stable. Patient denies any SI, HI, and AVH and reports that he feels that he had passive SI a few weeks ago.   Patient and provider talk about patient's reported hx of Bipolar dx. Patient reports that he cannot recall a true manic episodes, but endorses that he can recall having agitation and irritability in the past, with occasional decrease need for sleep. Patient cannot recall a time frame for these events and is not sure if they coincided with the times that he was also spending more money. Patient reports that he can recall having an inpatient psych hosp as a child due to behavior outburst and having some issues with "hyperactivity." Patient reports that he was on medications as a child, but cannot recall the 2. Patient reports that as an adult he has only ever been verbally aggressive and has never been to jail.   Patient reports that he has also started back using kratom and occasionally is using delta-9. Patient reports if he is not supposed to uses the substances "I need something" for his mood.   Visit Diagnosis:    ICD-10-CM   1. Bipolar affective disorder, currently depressed, moderate (HCC)  F31.32 buPROPion (WELLBUTRIN XL) 150  MG 24 hr tablet    2. Bipolar disorder, current episode mixed, mild (HCC)  F31.61 ARIPiprazole (ABILIFY) 10 MG tablet    3. Anxiety state  F41.1     4. Psychophysiological insomnia  F51.04 traZODone (DESYREL) 50 MG tablet      Past Psychiatric History: Bipolar disorder Insomnia Anxiety  INPT: Adolescent x1 for behavior/ aggression  Past Medical History:  Past  Medical History:  Diagnosis Date   Anxiety    Depression     Past Surgical History:  Procedure Laterality Date   NO PAST SURGERIES      Family Psychiatric History: unknown  Family History:  Family History  Family history unknown: Yes    Social History:  Social History   Socioeconomic History   Marital status: Single    Spouse name: Not on file   Number of children: Not on file   Years of education: Not on file   Highest education level: Not on file  Occupational History   Not on file  Tobacco Use   Smoking status: Every Day    Types: Cigarettes   Smokeless tobacco: Never   Tobacco comments:    Patient is now vaping (05/06/2022)  Vaping Use   Vaping Use: Every day   Substances: Nicotine, THC  Substance and Sexual Activity   Alcohol use: Yes    Comment: occasionally   Drug use: Yes    Types: Marijuana   Sexual activity: Not on file  Other Topics Concern   Not on file  Social History Narrative   Not on file   Social Determinants of Health   Financial Resource Strain: Medium Risk (04/28/2021)   Overall Financial Resource Strain (CARDIA)    Difficulty of Paying Living Expenses: Somewhat hard  Food Insecurity: No Food Insecurity (04/28/2021)   Hunger Vital Sign    Worried About Running Out of Food in the Last Year: Never true    Ran Out of Food in the Last Year: Never true  Transportation Needs: No Transportation Needs (04/28/2021)   PRAPARE - Hydrologist (Medical): No    Lack of Transportation (Non-Medical): No  Physical Activity: Inactive (04/28/2021)   Exercise Vital Sign    Days of Exercise per Week: 0 days    Minutes of Exercise per Session: 0 min  Stress: Stress Concern Present (04/28/2021)   [place]    Feeling of Stress : To some extent  Social Connections: Socially Isolated (04/28/2021)   Social Connection and Isolation Panel [NHANES]    Frequency of  Communication with Friends and Family: More than three times a week    Frequency of Social Gatherings with Friends and Family: Once a week    Attends Religious Services: Never    Marine scientist or Organizations: No    Attends Archivist Meetings: Never    Marital Status: Never married    Allergies:  Allergies  Allergen Reactions   Vinegar [Acetic Acid] Other (See Comments)    Red Vinegar- flush    Metabolic Disorder Labs: Lab Results  Component Value Date   HGBA1C 5.8 (H) 02/27/2021   No results found for: "PROLACTIN" Lab Results  Component Value Date   CHOL 134 02/27/2021   TRIG 57 02/27/2021   HDL 55 02/27/2021   CHOLHDL 2.4 02/27/2021   [place] 67 02/27/2021   Lab Results  Component Value Date   TSH 1.740 11/24/2020    Therapeutic  Level Labs: No results found for: "LITHIUM" No results found for: "VALPROATE" No results found for: "CBMZ"  Current Medications: Current Outpatient Medications  Medication Sig Dispense Refill   buPROPion (WELLBUTRIN XL) 150 MG 24 hr tablet Take 1 tablet (150 mg total) by mouth every morning. 30 tablet 3   amoxicillin (AMOXIL) 500 MG capsule Take 1 capsule by mouth once every 8 hours until finished. 21 capsule 0   ARIPiprazole (ABILIFY) 10 MG tablet Take 1 tablet (10 mg total) by mouth daily. 30 tablet 3   baclofen (LIORESAL) 10 MG tablet Take 0.5-1 tablets (5-10 mg total) by mouth 3 (three) times daily as needed for muscle spasms. 30 each 3   chlorhexidine (PERIDEX) 0.12 % solution Swish for one minute and expectorate twice daily for 10 days. 473 mL 0   diclofenac (VOLTAREN) 75 MG EC tablet Take 1 tablet (75 mg total) by mouth 2 (two) times daily as needed. 60 tablet 3   hydrOXYzine (ATARAX) 10 MG tablet Take 1 tablet (10 mg total) by mouth 3 (three) times daily as needed. 90 tablet 2   ibuprofen (ADVIL) 800 MG tablet Take 800 mg by mouth every 8 (eight) hours as needed.     ibuprofen (IBU) 800 MG tablet Take 1 tablet  by mouth once every 8 hours as needed for pain. 20 tablet 1   meloxicam (MOBIC) 7.5 MG tablet Take 7.5 mg by mouth daily.     traZODone (DESYREL) 50 MG tablet Take 1 tablet (50 mg total) by mouth at bedtime. 30 tablet 3   No current facility-administered medications for this visit.     Musculoskeletal:   Psychiatric Specialty Exam: Review of Systems  Psychiatric/Behavioral:  Positive for dysphoric mood. Negative for hallucinations and suicidal ideas.     There were no vitals taken for this visit.There is no height or weight on file to calculate BMI.  General Appearance: NA  Eye Contact:  NA  Speech:  Clear and Coherent  Volume:  Normal  Mood:  Dysphoric  Affect:  NA  Thought Process:  Coherent  Orientation:  Full (Time, Place, and Person)  Thought Content: Logical   Suicidal Thoughts:  No  Homicidal Thoughts:  No  Memory:  Immediate;   Good Recent;   Good Remote;   Fair  Judgement:  Fair  Insight:  Shallow  Psychomotor Activity:  NA  Concentration:  Concentration: Good  Recall:  NA  Fund of Knowledge: Good  Language: Good  Akathisia:  NA  Handed:    AIMS (if indicated): not done  Assets:  Communication Skills Desire for Improvement Housing Resilience  ADL's:  Intact  Cognition: WNL  Sleep:  Good   Screenings: GAD-7    Advertising copywriter from 07/07/2022 in [facility] Video Visit from 03/04/2022 in [facility] Counselor from 02/16/2022 in [facility] Counselor from 01/26/2022 in [facility] Video Visit from 12/31/2021 in [facility]  Total GAD-7 Score 9 5 3 8 6       PHQ2-9    Flowsheet Row Counselor from 07/07/2022 in [facility] Video Visit from 03/04/2022 in [facility] Counselor from 02/16/2022 in [facility] Counselor from 01/26/2022  in [facility] Video Visit from 12/31/2021 in [facility]  PHQ-2 Total Score 2 0 0 3 0  PHQ-9 Total Score 4 -- 2 6 --  Flowsheet Row Video Visit from 03/04/2022 in [facility] Video Visit from 12/31/2021 in [facility] Video Visit from 11/05/2021 in [place] CATEGORY Low Risk Low Risk Moderate Risk        Assessment and Plan:   Randy Braun is a 36 year old male with a past psychiatric history significant for bipolar disorder, anxiety, and insomnia. Patient continue to endorse dysphoric mood. Assessment today, and chart review is not convincing for a hx of manic episodes, but patient does appear to have hx of significant irritability requiring at least 1 hospitalization possibly related to combined impulsivity. Patient will be continued on Abilify, but could benefit from an antidepressant. Patient was also educated that substance use is likely contributing to worsened mood and cessation will be beneficial. Wellbutrin may help with cravings and also patient's purported impulsivity.   MDD, reccurent, moderate (VS Bipolar 2 disorder, depressed) - Continue Abilify 10mg  daily - Start Wellbutrin XL 150mg  daily - Continue Hydroxyzine 10mg  TID PRN   Insomnia- improved - Continue Trazodone 50mg  QHS  Collaboration of Care: Collaboration of Care:   Patient/Guardian was advised Release of Information must be obtained prior to any record release in order to collaborate their care with an outside provider. Patient/Guardian was advised if they have not already done so to contact the registration department to sign all necessary forms in order for [place] to release information regarding their care.   Consent: Patient/Guardian gives verbal consent for treatment and assignment of benefits for services provided during this visit. Patient/Guardian  expressed understanding and agreed to proceed.    Freida Busman, MD 09/09/2022, 3:48 PM

## 2022-09-10 ENCOUNTER — Other Ambulatory Visit: Payer: Self-pay

## 2022-09-13 ENCOUNTER — Other Ambulatory Visit: Payer: Self-pay

## 2022-09-14 ENCOUNTER — Ambulatory Visit (HOSPITAL_COMMUNITY): Payer: No Payment, Other | Admitting: Licensed Clinical Social Worker

## 2022-09-28 ENCOUNTER — Ambulatory Visit (INDEPENDENT_AMBULATORY_CARE_PROVIDER_SITE_OTHER): Payer: No Payment, Other | Admitting: Licensed Clinical Social Worker

## 2022-09-28 DIAGNOSIS — F3161 Bipolar disorder, current episode mixed, mild: Secondary | ICD-10-CM

## 2022-09-28 NOTE — Progress Notes (Signed)
   THERAPIST PROGRESS NOTE  Session Time: 30   Participation Level: Active  Behavioral Response: CasualAlertAnxious and Depressed  Type of Therapy: Individual Therapy  Treatment Goals addressed: [id number] WILL IDENTIFY COGNITIVE PATTERNS AND BELIEFS THAT INTERFERE WITH THERAPY   ProgressTowards Goals: Progressing  Interventions: Motivational Interviewing, Supportive, and Reframing  Summary: Randy Braun is a 36 y.o. male who presents with depressed and anxious mood\affect.  Patient was pleasant, cooperative, maintained good eye contact.  Patient was alert and oriented x 5.  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  Patient reports that he is resorted back to utilizing marijuana\delta 8 on a daily basis.  Patient reports that he is spending about $20-$60 weekly on delta 8 products.  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  Suicidal/Homicidal: Nowithout intent/plan  Therapist Response:     Intervention/Plan: LCSW spoke with patient about stages of change for precontemplation, contemplation, action, and maintenance.  LCSW spoke with patient about motivations to change such as financial security and better employment opportunities.  LCSW used motivational interviewing for open-ended questions, reflective listening, and positive affirmations.  Plan for patient is to decrease delta 8 use to less than 4 out of 7 days/week with a goal of abstaining from delta 8 by October 25, 2022.  Plan: Return again in  2 weeks.  Diagnosis: Bipolar disorder, current episode mixed, mild (HCC)  Collaboration of Care: Other None today.   Patient/Guardian was advised Release of Information must be obtained prior to any record release in order  to collaborate their care with an outside provider. Patient/Guardian was advised if they have not already done so to contact the registration department to sign all necessary forms in order for [place] to release information regarding their care.   Consent: Patient/Guardian gives verbal consent for treatment and assignment of benefits for services provided during this visit. Patient/Guardian expressed understanding and agreed to proceed.   Weber Cooks, LCSW 09/28/2022

## 2022-10-05 ENCOUNTER — Other Ambulatory Visit: Payer: Self-pay

## 2022-10-06 ENCOUNTER — Other Ambulatory Visit: Payer: Self-pay

## 2022-10-12 ENCOUNTER — Other Ambulatory Visit: Payer: Self-pay

## 2022-10-12 ENCOUNTER — Ambulatory Visit (HOSPITAL_COMMUNITY): Payer: No Payment, Other | Admitting: Licensed Clinical Social Worker

## 2022-10-13 ENCOUNTER — Other Ambulatory Visit: Payer: Self-pay

## 2022-11-02 ENCOUNTER — Ambulatory Visit (HOSPITAL_COMMUNITY): Payer: No Payment, Other | Admitting: Licensed Clinical Social Worker

## 2022-11-03 ENCOUNTER — Other Ambulatory Visit: Payer: Self-pay

## 2022-11-09 ENCOUNTER — Other Ambulatory Visit: Payer: Self-pay

## 2022-11-10 ENCOUNTER — Ambulatory Visit (INDEPENDENT_AMBULATORY_CARE_PROVIDER_SITE_OTHER): Payer: No Payment, Other | Admitting: Licensed Clinical Social Worker

## 2022-11-10 DIAGNOSIS — F3161 Bipolar disorder, current episode mixed, mild: Secondary | ICD-10-CM

## 2022-11-10 NOTE — Progress Notes (Signed)
   THERAPIST PROGRESS NOTE  Session Time:45   Participation Level: Active  Behavioral Response: CasualAlertAnxious and Depressed  Type of Therapy: Individual Therapy  Treatment Goals addressed:   Active     Bipolar Disorder CCP Problem  1 bipolar disorder depressed      Decrease PHQ-9 below 10  (Progressing)     Start:  12/08/21    Expected End:  11/26/22         Decrease GAD-7 below 5  (Progressing)     Start:  12/08/21    Expected End:  11/26/22         LTG: Stabilize mood and increase goal-directed behavior: Using positive affirmation 1 x daily  (Progressing)     Start:  12/08/21    Expected End:  11/26/22         STG: [id number] WILL COOPERATE WITH A PSYCHIATRIC EVALUATION ON 04/28/21 AND DURING ALL FOLLOW-UP VISITS (Completed/Met)     Start:  12/08/21    Expected End:  05/21/22    Resolved:  12/30/21      STG: [id number] WILL IDENTIFY COGNITIVE PATTERNS AND BELIEFS THAT INTERFERE WITH THERAPY (Progressing)     Start:  12/08/21    Expected End:  11/26/22         STG: [id number] WILL ATTEND AT LEAST 80% OF SCHEDULED FOLLOW-UP MEDICATION MANAGEMENT APPOINTMENTS (Completed/Met)     Start:  12/08/21    Expected End:  11/26/22    Resolved:  09/07/22       Decrease marijuana use to 0/7 days per week  (Progressing)     Start:  06/02/22    Expected End:  11/26/22               ProgressTowards Goals: Progressing  Interventions: Motivational Interviewing, Strength-based, Supportive, and Reframing  Summary: Randy Braun is a 37 y.o. male who presents with depressed and anxious mood\affect.  Patient was pleasant, cooperative, maintained good eye contact.  Patient engaged well in therapy session was dressed casually.[name] was alert and oriented x 5.  Patient comes in today stating "everything has been going all right".  Patient reports a decrease in anxiety and depression.  LCSW administered the GAD-7 and a PHQ-9 in session today.  Patient decreased PHQ-9 and GAD-7 in  session.  [name] attributes this to his medication change.  Patient reports more fulfillment at work and in person appointment.  Patient reports decrease in tension, worry, worthlessness, and hopelessness.  Suicidal/Homicidal: Nowithout intent/plan  Therapist Response:    Intervention/Plan: LCSW supportive therapy for praise and encouragement.  LCSW used motivational interviewing for open-ended questions, positive affirmations, and reflective listening.  LCSW used person centered therapy for empowerment and unconditional positive regard for nonjudgmental stance.  Plan: Return again in 3 weeks.  Diagnosis: Bipolar disorder, current episode mixed, mild ([place])  Collaboration of Care: Other None today   Patient/Guardian was advised Release of Information must be obtained prior to any record release in order to collaborate their care with an outside provider. Patient/Guardian was advised if they have not already done so to contact the registration department to sign all necessary forms in order for [place] to release information regarding their care.   Consent: Patient/Guardian gives verbal consent for treatment and assignment of benefits for services provided during this visit. Patient/Guardian expressed understanding and agreed to proceed.   Dory Horn, LCSW 11/10/2022

## 2022-11-16 ENCOUNTER — Telehealth (INDEPENDENT_AMBULATORY_CARE_PROVIDER_SITE_OTHER): Payer: No Payment, Other | Admitting: Student in an Organized Health Care Education/Training Program

## 2022-11-16 ENCOUNTER — Other Ambulatory Visit: Payer: Self-pay

## 2022-11-16 DIAGNOSIS — F3161 Bipolar disorder, current episode mixed, mild: Secondary | ICD-10-CM | POA: Diagnosis not present

## 2022-11-16 DIAGNOSIS — F3132 Bipolar disorder, current episode depressed, moderate: Secondary | ICD-10-CM

## 2022-11-16 DIAGNOSIS — F331 Major depressive disorder, recurrent, moderate: Secondary | ICD-10-CM

## 2022-11-16 MED ORDER — ARIPIPRAZOLE 10 MG PO TABS
10.0000 mg | ORAL_TABLET | Freq: Every day | ORAL | 3 refills | Status: DC
  Filled 2022-11-16 – 2022-12-07 (×2): qty 30, 30d supply, fill #0
  Filled 2023-01-05 (×2): qty 30, 30d supply, fill #1
  Filled 2023-02-08 (×2): qty 30, 30d supply, fill #2

## 2022-11-16 MED ORDER — BUPROPION HCL ER (XL) 150 MG PO TB24
150.0000 mg | ORAL_TABLET | ORAL | 3 refills | Status: DC
  Filled 2022-11-16 – 2022-12-07 (×2): qty 30, 30d supply, fill #0
  Filled 2023-01-05 (×2): qty 30, 30d supply, fill #1
  Filled 2023-02-08 (×2): qty 30, 30d supply, fill #2

## 2022-11-16 NOTE — Progress Notes (Unsigned)
BH MD/PA/NP OP Progress Note  11/17/2022 2:37 PM Randy Braun  MRN:  [id number]  Chief Complaint:  Chief Complaint  Patient presents with   Follow-up   Virtual Visit via Video Note  I connected with Randy Braun on 11/17/22 at 11:30 AM EST by a video enabled telemedicine application and verified that I am speaking with the correct person using two identifiers.  Location: Patient: Home Provider: Office   I discussed the limitations of evaluation and management by telemedicine and the availability of in person appointments. The patient expressed understanding and agreed to proceed.  History of Present Illness:   Randy Braun is a 37 year old male with a past psychiatric history significant for bipolar disorder, anxiety, and insomnia who presents to [facility] via virtual telephone visit for follow-up and medication management.  Patient is currently being managed on the following medications:    Abilify 10mg  daily Hydroxyzine 10mg  TID PRN Trazodone 50mg  QHS Wellbutrin XL 150mg  daily    Patient reports he is compliant with his medications, but has not required Hydroxyzine or Trazodone. Patient reports he is doing "alright" and sleeping well. Patient reports that he has not been feeling anxious or pressured anymore. Patient reports that he feels more calm. Patient reports that he still works at [employer]. Patient reports that he does not have constant irritability.   Patient reports that he still struggles to find joy and happiness in things. Patient reports that he was so used to struggling that he has not found things that bring significant joy but denies anhedonia. Patient report his concentration is good as is his appetite. Patient denies significant feelings of hopelessness/ guilt/ worthlessness. Patient denies symptoms recent concerning for mania.  Again spoke with patient about possible hx of manic episodes or hypomanic  episodes and again patient endorses that he generally is able to get rest even when he has a lot of energy.   Patient denies SI, HI and AVH.  Cannabis use: None, since Thanksgiving. Etoh: Rare, socially Kratom: None  I discussed the assessment and treatment plan with the patient. The patient was provided an opportunity to ask questions and all were answered. The patient agreed with the plan and demonstrated an understanding of the instructions.   The patient was advised to call back or seek an in-person evaluation if the symptoms worsen or if the condition fails to improve as anticipated.  I provided 20 minutes of non-face-to-face time during this encounter.   Randy Busman, MD  Visit Diagnosis:    ICD-10-CM   1. Moderate episode of recurrent major depressive disorder (HCC)  F33.1 Lipid Profile    HgB A1c    TSH    CBC with Differential    Comprehensive Metabolic Panel (CMET)    2. Bipolar affective disorder, currently depressed, moderate (HCC)  F31.32 buPROPion (WELLBUTRIN XL) 150 MG 24 hr tablet    3. Bipolar disorder, current episode mixed, mild (HCC)  F31.61 ARIPiprazole (ABILIFY) 10 MG tablet      Past Psychiatric History:    Bipolar disorder Insomnia Anxiety   INPT: Adolescent x1 for behavior/ aggression  Last visit 08/2022-patient endorsing dysphoric symptoms while on medications as well as mild anxiety but was able to associate anxiety with workplace.  Patient started on Wellbutrin XL 150 mg to address symptoms continued on Abilify 10 mg daily and hydroxyzine. Past Medical History:  Past Medical History:  Diagnosis Date   Anxiety    Depression  Past Surgical History:  Procedure Laterality Date   NO PAST SURGERIES      Family Psychiatric History: Unknown  Family History:  Family History  Family history unknown: Yes    Social History:  Social History   Socioeconomic History   Marital status: Single    Spouse name: Not on file   Number of  children: Not on file   Years of education: Not on file   Highest education level: Not on file  Occupational History   Not on file  Tobacco Use   Smoking status: Every Day    Types: Cigarettes   Smokeless tobacco: Never   Tobacco comments:    Patient is now vaping (05/06/2022)  Vaping Use   Vaping Use: Every day   Substances: Nicotine, THC  Substance and Sexual Activity   Alcohol use: Yes    Comment: occasionally   Drug use: Yes    Types: Marijuana   Sexual activity: Not on file  Other Topics Concern   Not on file  Social History Narrative   Not on file   Social Determinants of Health   Financial Resource Strain: Medium Risk (04/28/2021)   Overall Financial Resource Strain (CARDIA)    Difficulty of Paying Living Expenses: Somewhat hard  Food Insecurity: No Food Insecurity (04/28/2021)   Hunger Vital Sign    Worried About Running Out of Food in the Last Year: Never true    [place] in the Last Year: Never true  Transportation Needs: No Transportation Needs (04/28/2021)   PRAPARE - Hydrologist (Medical): No    Lack of Transportation (Non-Medical): No  Physical Activity: Inactive (04/28/2021)   Exercise Vital Sign    Days of Exercise per Week: 0 days    Minutes of Exercise per Session: 0 min  Stress: Stress Concern Present (04/28/2021)   [place]    Feeling of Stress : To some extent  Social Connections: Socially Isolated (04/28/2021)   Social Connection and Isolation Panel [NHANES]    Frequency of Communication with Friends and Family: More than three times a week    Frequency of Social Gatherings with Friends and Family: Once a week    Attends Religious Services: Never    Marine scientist or Organizations: No    Attends Archivist Meetings: Never    Marital Status: Never married    Allergies:  Allergies  Allergen Reactions   Vinegar [Acetic Acid] Other  (See Comments)    Red Vinegar- flush    Metabolic Disorder Labs: Lab Results  Component Value Date   HGBA1C 5.8 (H) 02/27/2021   No results found for: "PROLACTIN" Lab Results  Component Value Date   CHOL 134 02/27/2021   TRIG 57 02/27/2021   HDL 55 02/27/2021   CHOLHDL 2.4 02/27/2021   LDLCALC 67 02/27/2021   Lab Results  Component Value Date   TSH 1.740 11/24/2020    Therapeutic Level Labs: No results found for: "LITHIUM" No results found for: "VALPROATE" No results found for: "CBMZ"  Current Medications: Current Outpatient Medications  Medication Sig Dispense Refill   amoxicillin (AMOXIL) 500 MG capsule Take 1 capsule by mouth once every 8 hours until finished. 21 capsule 0   ARIPiprazole (ABILIFY) 10 MG tablet Take 1 tablet (10 mg total) by mouth daily. 30 tablet 3   baclofen (LIORESAL) 10 MG tablet Take 0.5-1 tablets (5-10 mg total) by mouth 3 (three)  times daily as needed for muscle spasms. 30 each 3   buPROPion (WELLBUTRIN XL) 150 MG 24 hr tablet Take 1 tablet (150 mg total) by mouth every morning. 30 tablet 3   chlorhexidine (PERIDEX) 0.12 % solution Swish for one minute and expectorate twice daily for 10 days. 473 mL 0   diclofenac (VOLTAREN) 75 MG EC tablet Take 1 tablet (75 mg total) by mouth 2 (two) times daily as needed. 60 tablet 3   hydrOXYzine (ATARAX) 10 MG tablet Take 1 tablet (10 mg total) by mouth 3 (three) times daily as needed. 90 tablet 2   ibuprofen (ADVIL) 800 MG tablet Take 800 mg by mouth every 8 (eight) hours as needed.     ibuprofen (IBU) 800 MG tablet Take 1 tablet by mouth once every 8 hours as needed for pain. 20 tablet 1   meloxicam (MOBIC) 7.5 MG tablet Take 7.5 mg by mouth daily.     traZODone (DESYREL) 50 MG tablet Take 1 tablet (50 mg total) by mouth at bedtime. 30 tablet 3   No current facility-administered medications for this visit.     Musculoskeletal: Defer  Psychiatric Specialty Exam: Review of Systems   Psychiatric/Behavioral:  Negative for dysphoric mood, hallucinations and suicidal ideas.     There were no vitals taken for this visit.There is no height or weight on file to calculate BMI.  General Appearance: Casual  Eye Contact:  Good  Speech:  Clear and Coherent  Volume:  Normal  Mood:  Euthymic  Affect:  Appropriate  Thought Process:  Coherent  Orientation:  Full (Time, Place, and Person)  Thought Content: Logical   Suicidal Thoughts:  No  Homicidal Thoughts:  No  Memory:  Immediate;   Good Recent;   Good  Judgement:  Good  Insight:  Good  Psychomotor Activity:  Normal  Concentration:  Concentration: Good  Recall:  Good  Fund of Knowledge: Good  Language: Good  Akathisia:  NA  Handed:    AIMS (if indicated): not done  Assets:  Communication Skills Desire for Improvement Housing Leisure Time Resilience Vocational/Educational  ADL's:  Intact  Cognition: WNL  Sleep:  Good   Screenings: GAD-7    Advertising copywriter from 11/10/2022 in [facility] Counselor from 07/07/2022 in [facility] Video Visit from 03/04/2022 in [facility] Counselor from 02/16/2022 in [facility] Counselor from 01/26/2022 in [facility]  Total GAD-7 Score 1 9 5 3 8       PHQ2-9    Flowsheet Row Counselor from 11/10/2022 in [facility] Counselor from 07/07/2022 in [facility] Video Visit from 03/04/2022 in [facility] Counselor from 02/16/2022 in [facility] Counselor from 01/26/2022 in [facility]  PHQ-2 Total Score 0 2 0 0 3  PHQ-9 Total Score 1 4 -- 2 6      Flowsheet Row Video Visit from 03/04/2022 in [facility] Video Visit from 12/31/2021 in [facility]  Video Visit from 11/05/2021 in [facility]  C-SSRS RISK CATEGORY Low Risk Low Risk Moderate Risk        Assessment and Plan:  Rane Decarlo is a 37 year old male with a past psychiatric history significant for bipolar disorder, anxiety, and insomnia Patient appears to be doing well and endorsing feeling stable. Patient MDD appears  to be in partial remission and is anxiety is also in remission. 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 Will not adjust Abilify at this time, would like to continue to monitor if patient is stable at next visit , will attempt to decrease. Patient is also no longer using substances which may contribute to his stability. Will get labs on patient due to Abilify use.   MDD, reccurent, moderate (VS Bipolar 2 disorder, depressed) - Continue Abilify 10mg  daily - Continue Wellbutrin XL 150mg  daily - Continue Hydroxyzine 10mg  TID PRN - Labs ordered: TSH, A1c, Lipids, CBC, CMP     Insomnia- resolved - Trazodone 50mg  QHS PRN   Collaboration of Care: Collaboration of Care:   Patient/Guardian was advised Release of Information must be obtained prior to any record release in order to collaborate their care with an outside provider. Patient/Guardian was advised if they have not already done so to contact the registration department to sign all necessary forms in order for to release information regarding their care.   Consent: Patient/Guardian gives verbal consent for treatment and assignment of benefits for services provided during this visit. Patient/Guardian expressed understanding and agreed to proceed.   PGY-3 , MD 11/17/2022, 2:37 PM

## 2022-11-17 ENCOUNTER — Encounter (HOSPITAL_COMMUNITY): Payer: Self-pay | Admitting: Student in an Organized Health Care Education/Training Program

## 2022-12-07 ENCOUNTER — Other Ambulatory Visit: Payer: Self-pay

## 2022-12-08 ENCOUNTER — Other Ambulatory Visit: Payer: Self-pay

## 2022-12-14 ENCOUNTER — Other Ambulatory Visit (HOSPITAL_COMMUNITY): Payer: Self-pay | Admitting: Student in an Organized Health Care Education/Training Program

## 2022-12-14 ENCOUNTER — Ambulatory Visit (INDEPENDENT_AMBULATORY_CARE_PROVIDER_SITE_OTHER): Payer: No Payment, Other | Admitting: Licensed Clinical Social Worker

## 2022-12-14 ENCOUNTER — Other Ambulatory Visit (HOSPITAL_COMMUNITY): Payer: No Payment, Other

## 2022-12-14 DIAGNOSIS — F3161 Bipolar disorder, current episode mixed, mild: Secondary | ICD-10-CM | POA: Diagnosis not present

## 2022-12-14 NOTE — Progress Notes (Signed)
THERAPIST PROGRESS NOTE  Session Time: 67  Participation Level: Active  Behavioral Response: CasualAlertAnxious and Depressed  Type of Therapy: Individual Therapy  Treatment Goals addressed:  Active     Bipolar Disorder CCP Problem  1 bipolar disorder depressed      Decrease PHQ-9 below 10  (Progressing)     Start:  12/08/21    Expected End:  05/27/23         Decrease GAD-7 below 5  (Progressing)     Start:  12/08/21    Expected End:  05/27/23         LTG: Stabilize mood and increase goal-directed behavior: Using positive affirmation 1 x daily  (Progressing)     Start:  12/08/21    Expected End:  05/27/23       Goal Note     Pt reports positive affirmations 3 x weekly          STG: Randy Braun WILL COOPERATE WITH A PSYCHIATRIC EVALUATION ON 04/28/21 AND DURING ALL FOLLOW-UP VISITS (Completed/Met)     Start:  12/08/21    Expected End:  05/21/22    Resolved:  12/30/21      STG: Randy Braun WILL IDENTIFY COGNITIVE PATTERNS AND BELIEFS THAT INTERFERE WITH THERAPY (Progressing)     Start:  12/08/21    Expected End:  05/27/23         STG: Randy Braun WILL ATTEND AT LEAST 80% OF SCHEDULED FOLLOW-UP MEDICATION MANAGEMENT APPOINTMENTS (Completed/Met)     Start:  12/08/21    Expected End:  11/26/22    Resolved:  09/07/22       Decrease marijuana use to 0/7 days per week  (Progressing)     Start:  06/02/22    Expected End:  05/27/23          Goal Note     Last use 1 month ago             ProgressTowards Goals: Progressing  Interventions: CBT, Motivational Interviewing, and Supportive  Summary: Randy Braun is a 37 y.o. male who presents with depressed and anxious mood\affect.  Patient was pleasant, cooperative, maintained good eye contact.  He engaged well in therapy session and was dressed casually. Randy Braun was alert and oriented x 5.   Primary stressor is work.  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  He reports that recently  got a raise which made it more manageable to work there.  However patient reports today that he has increased symptoms of tension, worry, worthlessness, and hopelessness.  He states that it is becoming more stressful at work due to people not doing her jobs and dealing with the kids and their parents.  Patient states that he would like to find a new job as soon as possible.  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  Suicidal/Homicidal: Nowithout intent/plan  Therapist Response:    LCSW utilized psychoanalytic therapy for patient to express thoughts, feelings and emotions.  LCSW used CBT therapy to talk about cognitive distortions such as "catastrophic language".  LCSW supportive therapy for praise and encouragement.  LCSW used empowerment for person centered therapy.  LCSW used motivational interviewing for open-ended questions, positive affirmations, and reflective listening.    Plan: Return again in 3 weeks.  Diagnosis: Bipolar disorder, current episode mixed, mild ([place])  Collaboration of Care: Other None   Patient/Guardian was advised Release of Information must be obtained prior to any record release in order to collaborate their care with an outside provider. Patient/Guardian was advised if they have not already done so to contact the registration department to sign all necessary forms in order for [place] to release information regarding their care.   Consent: Patient/Guardian gives verbal consent for treatment and assignment of benefits for services provided during this visit. Patient/Guardian expressed understanding and agreed to proceed.   Dory Horn, LCSW 12/14/2022

## 2022-12-15 LAB — CBC WITH DIFFERENTIAL/PLATELET
Basophils Absolute: 0.1 10*3/uL (ref 0.0–0.2)
Basos: 1 %
EOS (ABSOLUTE): 0 10*3/uL (ref 0.0–0.4)
Eos: 1 %
Hematocrit: 45.4 % (ref 37.5–51.0)
Hemoglobin: 14.6 g/dL (ref 13.0–17.7)
Immature Grans (Abs): 0.1 10*3/uL (ref 0.0–0.1)
Immature Granulocytes: 1 %
Lymphocytes Absolute: 2.2 10*3/uL (ref 0.7–3.1)
Lymphs: 36 %
MCH: 24.9 pg — ABNORMAL LOW (ref 26.6–33.0)
MCHC: 32.2 g/dL (ref 31.5–35.7)
MCV: 77 fL — ABNORMAL LOW (ref 79–97)
Monocytes Absolute: 0.4 10*3/uL (ref 0.1–0.9)
Monocytes: 7 %
Neutrophils Absolute: 3.4 10*3/uL (ref 1.4–7.0)
Neutrophils: 54 %
Platelets: 233 10*3/uL (ref 150–450)
RBC: 5.87 x10E6/uL — ABNORMAL HIGH (ref 4.14–5.80)
RDW: 16.1 % — ABNORMAL HIGH (ref 11.6–15.4)
WBC: 6.2 10*3/uL (ref 3.4–10.8)

## 2022-12-15 LAB — COMPREHENSIVE METABOLIC PANEL
ALT: 42 IU/L (ref 0–44)
AST: 34 IU/L (ref 0–40)
Albumin/Globulin Ratio: 2.3 — ABNORMAL HIGH (ref 1.2–2.2)
Albumin: 5.1 g/dL (ref 4.1–5.1)
Alkaline Phosphatase: 88 IU/L (ref 44–121)
BUN/Creatinine Ratio: 8 — ABNORMAL LOW (ref 9–20)
BUN: 9 mg/dL (ref 6–20)
Bilirubin Total: 0.4 mg/dL (ref 0.0–1.2)
CO2: 24 mmol/L (ref 20–29)
Calcium: 9.7 mg/dL (ref 8.7–10.2)
Chloride: 101 mmol/L (ref 96–106)
Creatinine, Ser: 1.08 mg/dL (ref 0.76–1.27)
Globulin, Total: 2.2 g/dL (ref 1.5–4.5)
Glucose: 88 mg/dL (ref 70–99)
Potassium: 4.5 mmol/L (ref 3.5–5.2)
Sodium: 140 mmol/L (ref 134–144)
Total Protein: 7.3 g/dL (ref 6.0–8.5)
eGFR: 91 mL/min/{1.73_m2} (ref >59)

## 2022-12-15 LAB — LIPID PANEL
Chol/HDL Ratio: 3.1 ratio (ref 0.0–5.0)
Cholesterol, Total: 177 mg/dL (ref 100–199)
HDL: 58 mg/dL (ref >39)
LDL Chol Calc (NIH): 106 mg/dL — ABNORMAL HIGH (ref 0–99)
Triglycerides: 69 mg/dL (ref 0–149)
VLDL Cholesterol Cal: 13 mg/dL (ref 5–40)

## 2022-12-15 LAB — HEMOGLOBIN A1C
Est. average glucose Bld gHb Est-mCnc: 120 mg/dL
Hgb A1c MFr Bld: 5.8 % — ABNORMAL HIGH (ref 4.8–5.6)

## 2022-12-15 LAB — SPECIMEN STATUS REPORT

## 2022-12-15 LAB — TSH: TSH: 1.42 u[IU]/mL (ref 0.450–4.500)

## 2023-01-03 IMAGING — DX DG CERVICAL SPINE COMPLETE 4+V
5 series · 5 of 5 positions shown · non-contrast
Comparison: None.

CLINICAL DATA: Neck pain for 1 month without known injury.

EXAM:
CERVICAL SPINE - COMPLETE 4+ VIEW

[cervical spine lat]
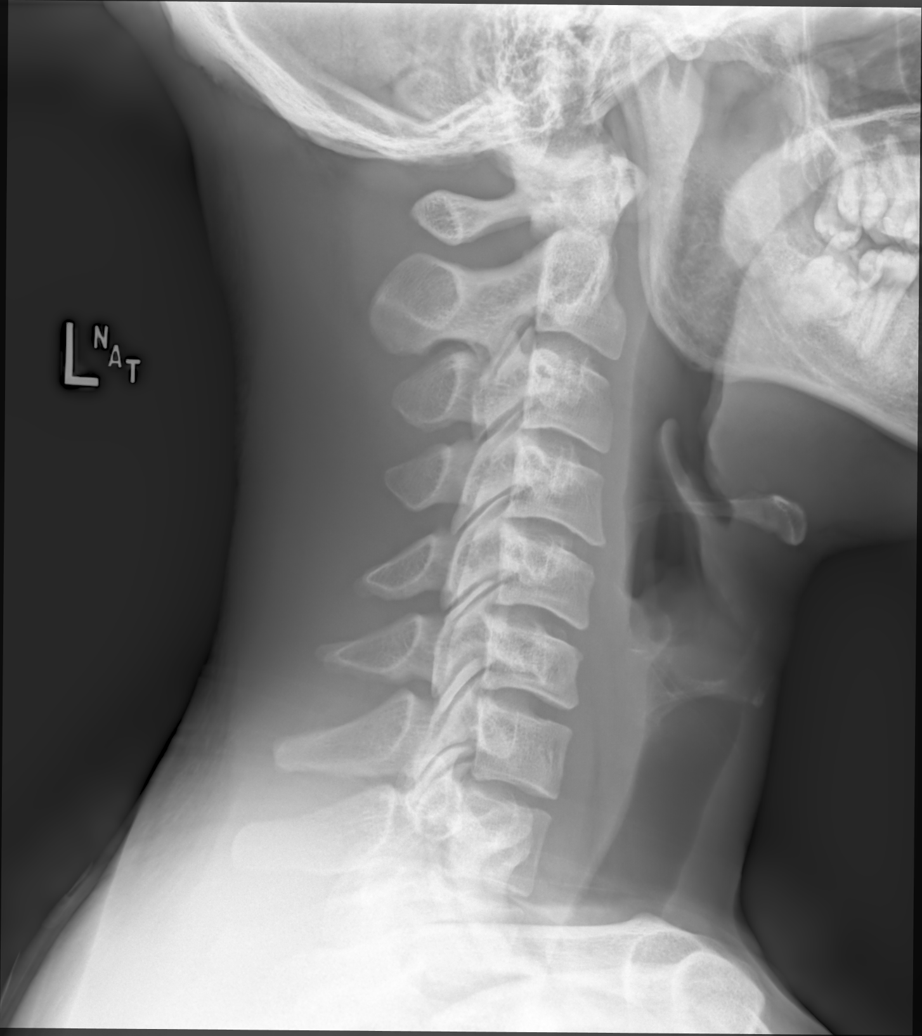

[cervical spine oblique (1 of 2)]
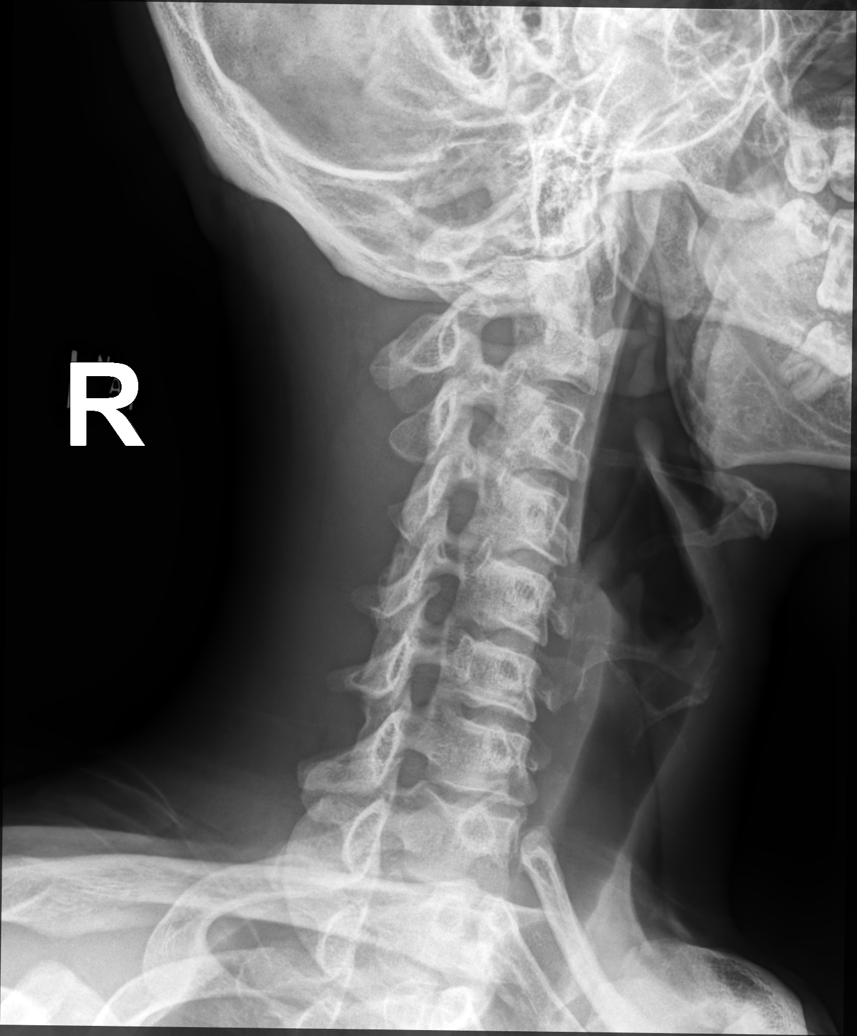

[cervical spine oblique (2 of 2)]
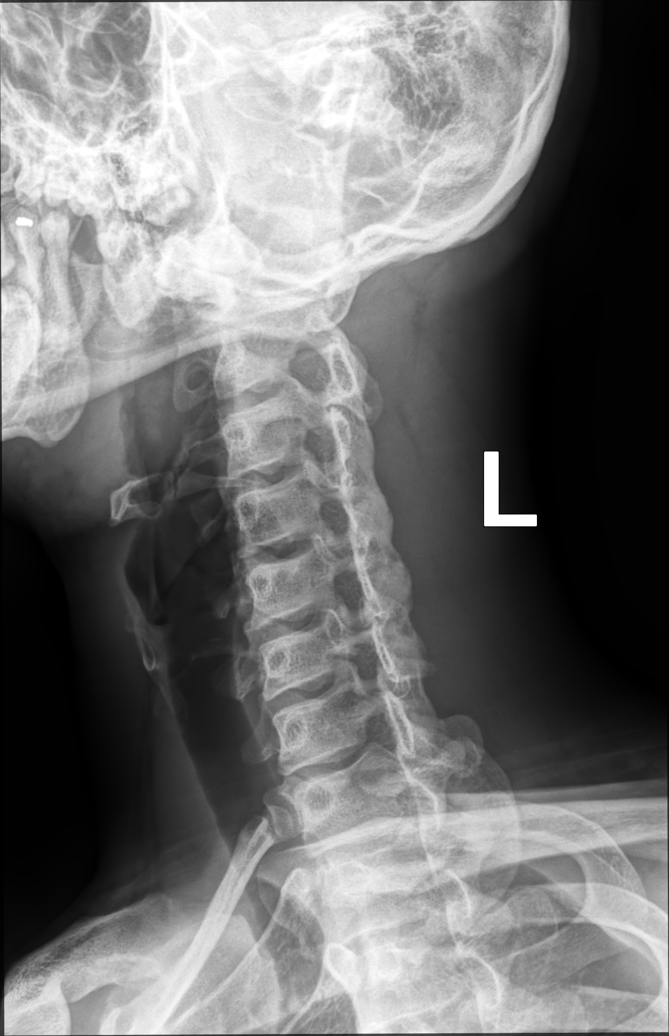

[cervical spine ap]
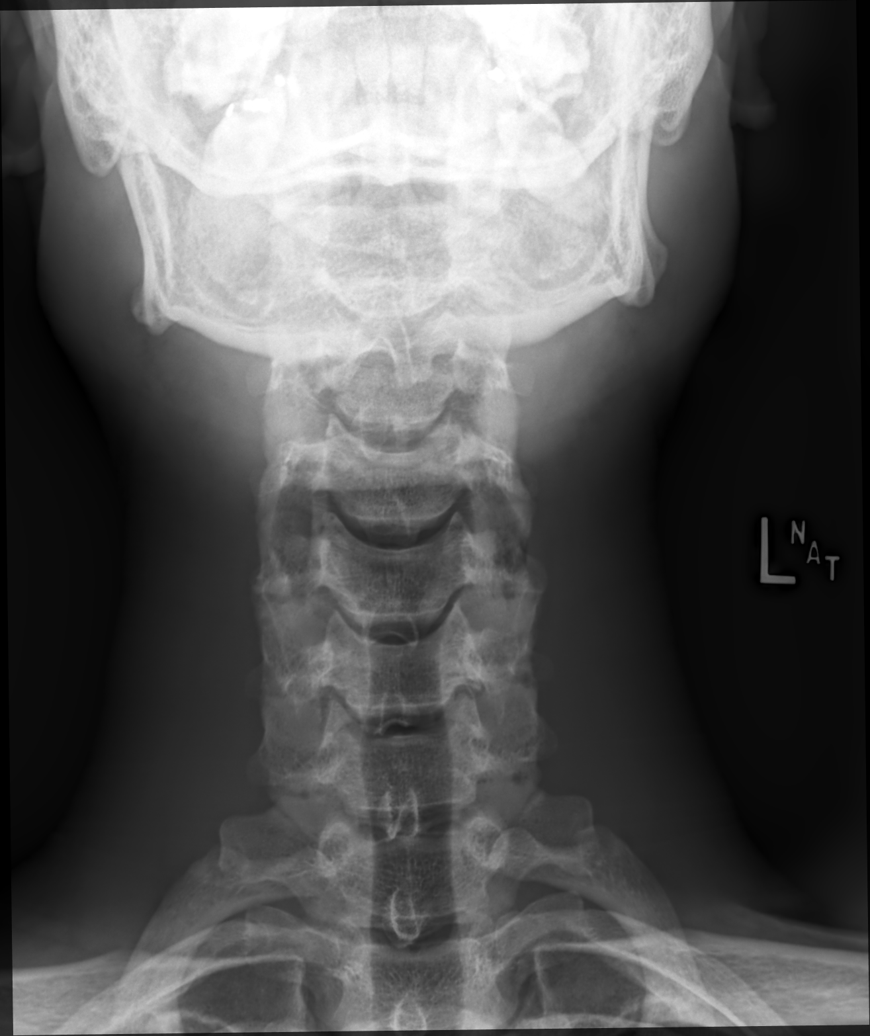

[cervical spine open mouth]
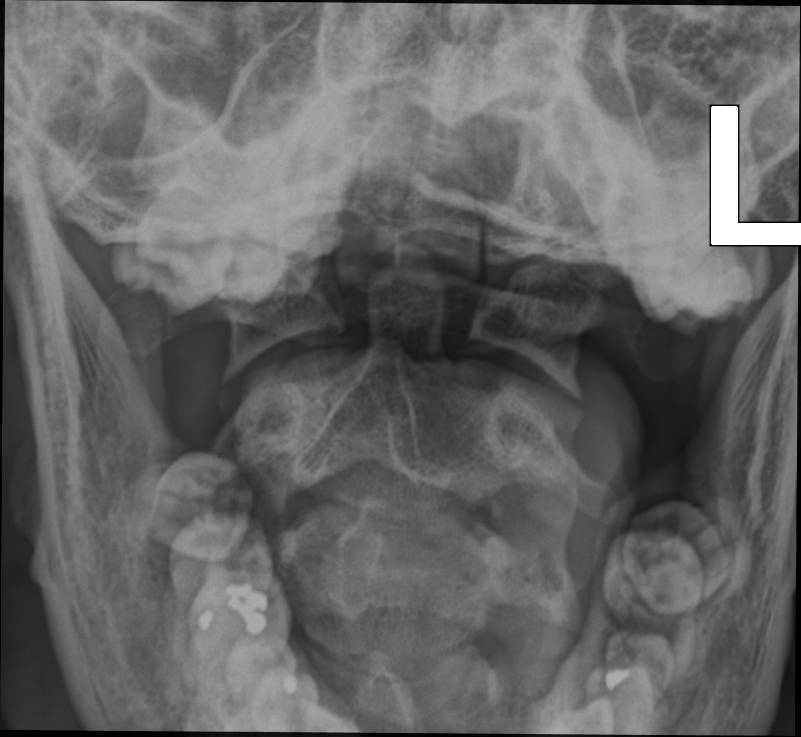

[5 of 5 positions shown; findings below may reference images not displayed]

FINDINGS: There is no evidence of cervical spine fracture or prevertebral soft
tissue swelling. Alignment is normal. No other significant bone
abnormalities are identified.
IMPRESSION: Negative cervical spine radiographs.

## 2023-01-04 ENCOUNTER — Ambulatory Visit (INDEPENDENT_AMBULATORY_CARE_PROVIDER_SITE_OTHER): Payer: No Payment, Other | Admitting: Licensed Clinical Social Worker

## 2023-01-04 DIAGNOSIS — F3161 Bipolar disorder, current episode mixed, mild: Secondary | ICD-10-CM

## 2023-01-04 NOTE — Progress Notes (Signed)
THERAPIST PROGRESS NOTE  Session Time: 46  Participation Level: Active  Behavioral Response: CasualAlertAnxious and Depressed  Type of Therapy: Individual Therapy  Treatment Goals addressed:  Active     Bipolar Disorder CCP Problem  1 bipolar disorder depressed      Decrease PHQ-9 below 10  (Progressing)     Start:  12/08/21    Expected End:  05/27/23         Decrease GAD-7 below 5  (Progressing)     Start:  12/08/21    Expected End:  05/27/23         LTG: Stabilize mood and increase goal-directed behavior: Using positive affirmation 1 x daily  (Progressing)     Start:  12/08/21    Expected End:  05/27/23       Goal Note     Pt reports positive affirmations 3 x weekly          STG: Vikrant WILL COOPERATE WITH A PSYCHIATRIC EVALUATION ON 04/28/21 AND DURING ALL FOLLOW-UP VISITS (Completed/Met)     Start:  12/08/21    Expected End:  05/21/22    Resolved:  12/30/21      STG: [id number] WILL IDENTIFY COGNITIVE PATTERNS AND BELIEFS THAT INTERFERE WITH THERAPY (Progressing)     Start:  12/08/21    Expected End:  05/27/23       Goal Note     Decreasing catastrophic language         STG: Sabir WILL ATTEND AT LEAST 80% OF SCHEDULED FOLLOW-UP MEDICATION MANAGEMENT APPOINTMENTS (Completed/Met)     Start:  12/08/21    Expected End:  11/26/22    Resolved:  09/07/22       Decrease marijuana use to 0/7 days per week  (Progressing)     Start:  06/02/22    Expected End:  05/27/23          Goal Note     Patient reports no marijuana use in the last 30 days.            ProgressTowards Goals: Progressing  Interventions: CBT and Motivational Interviewing  Summary: Randy Braun is a 37 y.o. male who presents with   depressed and anxious mood\affect.  Patient was pleasant, cooperative, maintained good eye contact.  He engaged well in therapy session was dressed casually.  Patient comes in with primary stressors of illness, financials, and work.  Patient  reports symptoms for increased in weight, hopelessness, worthlessness, and fatigue.  Alphus reports that he is concerned about his lab values for A\G ratio and also creatinine.  Patient reports that his lab values for creatinine were low and his A\G ratio was high.  He reports that he has not received call from a physician and would like to discuss lab values with her.  LCSW solution-focused therapy to educate patient on getting contact with physician.  LCSW also sent secure chat message to provider to discuss lab values further and next steps.   Patient reports that he has still been looking for more gainful employment.  Patient reports that he is not happy with the current salary his making and knows he needs to make more if he wants to his goals of getting a car, getting his license back, and feeling more stable financially.  Suicidal/Homicidal: No  Therapist Response:     Intervention/Plan: LCSW psychoanalytic therapy for patient to express thoughts, feelings and concerns.  LCSW educated patient on taking medications as prescribed.  LCSW spoke with patient today about getting  in contact with physician to discuss lab values.  LCSW used supportive therapy for praise and encouragement.  LCSW used motivational interviewing for open-ended questions, positive affirmations, and reflective listening.  Plan: Return again in 3 weeks.  Diagnosis: Bipolar disorder, current episode mixed, mild ([place])  Collaboration of Care: Other None today.   Patient/Guardian was advised Release of Information must be obtained prior to any record release in order to collaborate their care with an outside provider. Patient/Guardian was advised if they have not already done so to contact the registration department to sign all necessary forms in order for [place] to release information regarding their care.   Consent: Patient/Guardian gives verbal consent for treatment and assignment of benefits for services provided during this  visit. Patient/Guardian expressed understanding and agreed to proceed.   Dory Horn, LCSW 01/04/2023

## 2023-01-05 ENCOUNTER — Other Ambulatory Visit: Payer: Self-pay

## 2023-01-05 ENCOUNTER — Telehealth (HOSPITAL_COMMUNITY): Payer: Self-pay | Admitting: Student in an Organized Health Care Education/Training Program

## 2023-01-05 NOTE — Telephone Encounter (Signed)
Called patient to discuss labs and discuss that he felt that he had been gaining weight over the last 2 months. He had endorsed this to his therapist and then again over the phone. Addressed that patient has not had any medication changes recently, but has been leading a sedentary lifestyle. Briefly discussed metformin especially given his year-long status of pre-diabetes. Patient decided he wants to try lifestyle changes right now. Patient endorsed he may try to get an earlier appt as well.   PGY-3 Damita Dunnings, MD

## 2023-01-26 ENCOUNTER — Ambulatory Visit (HOSPITAL_COMMUNITY): Payer: No Payment, Other | Admitting: Licensed Clinical Social Worker

## 2023-02-08 ENCOUNTER — Other Ambulatory Visit: Payer: Self-pay

## 2023-02-09 ENCOUNTER — Other Ambulatory Visit: Payer: Self-pay

## 2023-02-16 ENCOUNTER — Telehealth (HOSPITAL_COMMUNITY): Payer: No Payment, Other | Admitting: Student in an Organized Health Care Education/Training Program

## 2023-02-16 ENCOUNTER — Ambulatory Visit (INDEPENDENT_AMBULATORY_CARE_PROVIDER_SITE_OTHER): Payer: No Payment, Other | Admitting: Licensed Clinical Social Worker

## 2023-02-16 DIAGNOSIS — F3161 Bipolar disorder, current episode mixed, mild: Secondary | ICD-10-CM | POA: Diagnosis not present

## 2023-02-16 NOTE — Progress Notes (Signed)
   THERAPIST PROGRESS NOTE  Session Time: 57   Participation Level: Active  Behavioral Response: CasualAlertAnxious and Depressed  Type of Therapy: Individual Therapy  Treatment Goals addressed:  [id number] WILL IDENTIFY COGNITIVE PATTERNS AND BELIEFS THAT INTERFERE WITH THERAPY   ProgressTowards Goals: Progressing  Interventions: CBT and Motivational Interviewing  Summary: Randy Braun is a 37 y.o. male who presents with depressed and anxious mood\affect.  Patient was pleasant, cooperative, maintained good eye contact.  He engaged well in therapy session was dressed casually.  Patient reports primary stressors are his weight gain.  He reports that he has gained about 20 pounds in the last 4 months.  Patient reports that he has talked with his psychiatrist and has been advised that the psychiatric medication should not be causing the weight gain.  It was advised to have patient to follow-up with a primary care physician.  LCSW educated patient on tracking calories to see how many calories he was taking over a few weeks time.  And timeframe of 4 months patient is gaining about 1.5 pounds per week.  Patient also reports stress with financials and has his rent has gone up $150 per month.  Patient reports he was honest about his income increasing by $2 per hour which is about $75 per week with the number of hours he is working.   Suicidal/Homicidal: Nowithout intent/plan  Therapist Response:     Intervention/Plan: LCSW psycho analytic therapy for patient to express thoughts, feelings and emotions.  LCSW supportive therapy for praise and encouragement.  LCSW used person centered therapy for empowerment and unconditional positive regard.  Plan for patient is to track calorie intake over a 7-day span and to see average calories taken in per day using an app called "now my fitness".  LCSW also provided patient with solution focused therapy by providing him with resources to community health and  wellness through [facility] health for primary care physician.  Plan: Return again in 3 weeks.  Diagnosis: Bipolar disorder, current episode mixed, mild  Collaboration of Care: Other None today   Patient/Guardian was advised Release of Information must be obtained prior to any record release in order to collaborate their care with an outside provider. Patient/Guardian was advised if they have not already done so to contact the registration department to sign all necessary forms in order for [place] to release information regarding their care.   Consent: Patient/Guardian gives verbal consent for treatment and assignment of benefits for services provided during this visit. Patient/Guardian expressed understanding and agreed to proceed.   Weber Cooks, LCSW 02/16/2023

## 2023-02-25 ENCOUNTER — Telehealth (INDEPENDENT_AMBULATORY_CARE_PROVIDER_SITE_OTHER): Payer: No Payment, Other | Admitting: Student in an Organized Health Care Education/Training Program

## 2023-02-25 ENCOUNTER — Other Ambulatory Visit: Payer: Self-pay

## 2023-02-25 DIAGNOSIS — F3161 Bipolar disorder, current episode mixed, mild: Secondary | ICD-10-CM | POA: Diagnosis not present

## 2023-02-25 DIAGNOSIS — F3132 Bipolar disorder, current episode depressed, moderate: Secondary | ICD-10-CM

## 2023-02-25 MED ORDER — ARIPIPRAZOLE 5 MG PO TABS
5.0000 mg | ORAL_TABLET | Freq: Every day | ORAL | 2 refills | Status: DC
  Filled 2023-02-25: qty 30, 30d supply, fill #0
  Filled 2023-03-29: qty 30, 30d supply, fill #1

## 2023-02-25 MED ORDER — BUPROPION HCL ER (XL) 150 MG PO TB24
150.0000 mg | ORAL_TABLET | ORAL | 2 refills | Status: DC
  Filled 2023-02-25 – 2023-03-09 (×2): qty 30, 30d supply, fill #0

## 2023-02-25 NOTE — Progress Notes (Addendum)
Virtual Visit via Video Note  I connected with Randy Braun on 02/25/23 at  8:00 AM EDT by a video enabled telemedicine application and verified that I am speaking with the correct person using two identifiers.  Location: Patient: Home Provider: Office   I discussed the limitations of evaluation and management by telemedicine and the availability of in person appointments. The patient expressed understanding and agreed to proceed.    I discussed the assessment and treatment plan with the patient. The patient was provided an opportunity to ask questions and all were answered. The patient agreed with the plan and demonstrated an understanding of the instructions.   The patient was advised to call back or seek an in-person evaluation if the symptoms worsen or if the condition fails to improve as anticipated.  I provided 25 minutes of non-face-to-face time during this encounter.   Randy Morton, MD  [facility] MD/PA/NP OP Progress Note  02/25/2023 8:35 AM Randy Braun  MRN:  [id number]  Chief Complaint:  Chief Complaint  Patient presents with   Follow-up   HPI: Randy Braun is a 37 year old male with a past psychiatric history significant for bipolar disorder? (Most likely MDD), anxiety, and insomnia who presents to [facility] via virtual telephone visit for follow-up and medication management.  Patient is currently being managed on the following medications:    Abilify 10mg  daily Hydroxyzine 10mg  TID PRN Trazodone 50mg  QHS PRN Wellbutrin XL 150mg  daily   Patient reports that he is doing ok. Patient reports that he feels steady. Patient reports that sleep has been ok, but he has been waking up early and thinks that he may need to start taking his Trazodone again. He does not feel like his thoughts are racing nor does he feel like he has elevated energy. He endorses that he thinks right now he gets around 5h but is trying to get 7h. He reports that  his job has been difficult as  he is the only full time person, and the turnover rate for others is high. He feels like everything falls on him. He is trying not to impulsively quit. Patient reports that he does have anxiety on the job, but not as much outside of work. He denies somatization of anxiety. Patient reports he will work on managing his workplace anxiety. Patient denies SI, HI, and AVH. Patient reports he has good appetite.   Patient again denies any hx of manic episodes.   Patient reports that he is working on lifestyle modifications for his weight.  Visit Diagnosis:    ICD-10-CM   1. Bipolar disorder, current episode mixed, mild (HCC)  F31.61 ARIPiprazole (ABILIFY) 5 MG tablet    2. Bipolar affective disorder, currently depressed, moderate (HCC)  F31.32 buPROPion (WELLBUTRIN XL) 150 MG 24 hr tablet      Past Psychiatric History: Bipolar disorder Insomnia Anxiety   INPT: Adolescent x1 for behavior/ aggression   Last visit 08/2022-patient endorsing dysphoric symptoms while on medications as well as mild anxiety but was able to associate anxiety with workplace.  Patient started on Wellbutrin XL 150 mg to address symptoms continued on Abilify 10 mg daily and hydroxyzine.  10/2021- Patient MDD appears to be in partial remission and is anxiety is also in remission. 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 Will not adjust Abilify at this time, would  like to continue to monitor if patient is stable at next visit , will attempt to decrease   Past Medical History:  Past Medical History:  Diagnosis Date   Anxiety    Depression     Past Surgical History:  Procedure Laterality Date   NO PAST SURGERIES      Family Psychiatric History: Unknown  Family History:  Family History  Family history unknown: Yes    Social History:   Social History   Socioeconomic History   Marital status: Single    Spouse name: Not on file   Number of children: Not on file   Years of education: Not on file   Highest education level: Not on file  Occupational History   Not on file  Tobacco Use   Smoking status: Every Day    Types: Cigarettes   Smokeless tobacco: Never   Tobacco comments:    Patient is now vaping (05/06/2022)  Vaping Use   Vaping Use: Every day   Substances: Nicotine, THC  Substance and Sexual Activity   Alcohol use: Yes    Comment: occasionally   Drug use: Yes    Types: Marijuana   Sexual activity: Not on file  Other Topics Concern   Not on file  Social History Narrative   Not on file   Social Determinants of Health   Financial Resource Strain: Medium Risk (04/28/2021)   Overall Financial Resource Strain (CARDIA)    Difficulty of Paying Living Expenses: Somewhat hard  Food Insecurity: No Food Insecurity (04/28/2021)   Hunger Vital Sign    Worried About Running Out of Food in the Last Year: Never true    Ran Out of Food in the Last Year: Never true  Transportation Needs: No Transportation Needs (04/28/2021)   PRAPARE - Administrator, Civil Service (Medical): No    Lack of Transportation (Non-Medical): No  Physical Activity: Inactive (04/28/2021)   Exercise Vital Sign    Days of Exercise per Week: 0 days    Minutes of Exercise per Session: 0 min  Stress: Stress Concern Present (04/28/2021)   Randy Braun [employer] Occupational Health - Occupational Stress Questionnaire    Feeling of Stress : To some extent  Social Connections: Socially Isolated (04/28/2021)   Social Connection and Isolation Panel [NHANES]    Frequency of Communication with Friends and Family: More than three times a week    Frequency of Social Gatherings with Friends and Family: Once a week    Attends Religious Services: Never    Database administrator or Organizations: No    Attends Banker Meetings: Never     Marital Status: Never married    Allergies:  Allergies  Allergen Reactions   Vinegar [Acetic Acid] Other (See Comments)    Red Vinegar- flush    Metabolic Disorder Labs: Lab Results  Component Value Date   HGBA1C 5.8 (H) 12/14/2022   No results found for: "PROLACTIN" Lab Results  Component Value Date   CHOL 177 12/14/2022   TRIG 69 12/14/2022   HDL 58 12/14/2022   CHOLHDL 3.1 12/14/2022   LDLCALC 106 (H) 12/14/2022   LDLCALC 67 02/27/2021   Lab Results  Component Value Date   TSH 1.420 12/14/2022   TSH 1.740 11/24/2020    Therapeutic Level Labs: No results found for: "LITHIUM" No results found for: "VALPROATE" No results found for: "CBMZ"  Current Medications: Current Outpatient Medications  Medication Sig Dispense Refill   amoxicillin (AMOXIL) 500 MG  capsule Take 1 capsule by mouth once every 8 hours until finished. 21 capsule 0   ARIPiprazole (ABILIFY) 5 MG tablet Take 1 tablet (5 mg total) by mouth daily. 30 tablet 2   baclofen (LIORESAL) 10 MG tablet Take 0.5-1 tablets (5-10 mg total) by mouth 3 (three) times daily as needed for muscle spasms. 30 each 3   buPROPion (WELLBUTRIN XL) 150 MG 24 hr tablet Take 1 tablet (150 mg total) by mouth every morning. 30 tablet 2   chlorhexidine (PERIDEX) 0.12 % solution Swish for one minute and expectorate twice daily for 10 days. 473 mL 0   diclofenac (VOLTAREN) 75 MG EC tablet Take 1 tablet (75 mg total) by mouth 2 (two) times daily as needed. 60 tablet 3   hydrOXYzine (ATARAX) 10 MG tablet Take 1 tablet (10 mg total) by mouth 3 (three) times daily as needed. 90 tablet 2   ibuprofen (ADVIL) 800 MG tablet Take 800 mg by mouth every 8 (eight) hours as needed.     ibuprofen (IBU) 800 MG tablet Take 1 tablet by mouth once every 8 hours as needed for pain. 20 tablet 1   meloxicam (MOBIC) 7.5 MG tablet Take 7.5 mg by mouth daily.     traZODone (DESYREL) 50 MG tablet Take 1 tablet (50 mg total) by mouth at bedtime. 30 tablet 3   No  current facility-administered medications for this visit.     Psychiatric Specialty Exam: Review of Systems  Psychiatric/Behavioral:  Positive for sleep disturbance. Negative for dysphoric mood, hallucinations and suicidal ideas. The patient is nervous/anxious.     There were no vitals taken for this visit.There is no height or weight on file to calculate BMI.  General Appearance: Well Groomed  Eye Contact:  Good  Speech:  Clear and Coherent  Volume:  Normal  Mood:  Euthymic  Affect:  Appropriate  Thought Process:  Coherent  Orientation:  Full (Time, Place, and Person)  Thought Content: Logical   Suicidal Thoughts:  No  Homicidal Thoughts:  No  Memory:  Immediate;   Good Recent;   Good  Judgement:  Good  Insight:  Fair  Psychomotor Activity:  Normal  Concentration:  Concentration: Good  Recall:  Good  Fund of Knowledge: Good  Language: Good  Akathisia:  No  Handed:    AIMS (if indicated): not done  Assets:  Communication Skills Desire for Improvement Housing Resilience Social Support  ADL's:  Intact  Cognition: WNL  Sleep:  Poor   Screenings: GAD-7    Advertising copywriter from 11/10/2022 in [facility] Counselor from 07/07/2022 in [facility] Video Visit from 03/04/2022 in [facility] Counselor from 02/16/2022 in [facility] Counselor from 01/26/2022 in [facility]  Total GAD-7 Score 1 9 5 3 8       PHQ2-9    Flowsheet Row Counselor from 11/10/2022 in [facility] Counselor from 07/07/2022 in [facility] Video Visit from 03/04/2022 in [facility] Counselor from 02/16/2022 in [facility] Counselor from 01/26/2022 in [facility]  PHQ-2 Total Score 0 2 0 0 3  PHQ-9 Total Score 1 4 -- 2 6       Flowsheet Row Video Visit from 03/04/2022 in [facility] Video Visit from 12/31/2021 in [facility] Video Visit from 11/05/2021 in [place]  [facility]  C-SSRS RISK CATEGORY Low Risk Low Risk Moderate Risk        Assessment and Plan:  Anxiety appears to acutely exacerbated, but depression appears to be in remission still. Do think patient still benefits from Wellbutrin and do not believe it wil contribute to anxiety. Overall mood is stable, and patient may tolerate decrease in Abilify. This may help decrease risk for metabolic syndrome. At this time patient does not appear to need Abilify as a mood stabilizer for prevention of mania, but it could still be a adjunct for depression tx. Do believe that patient's decrease sleep is associated with anxiety about work currently. Will continue to monitor.   Discussed with patient pending transition of care to a new resident starting July 1st, and likely discontinuation of care by this provider at that time.    MDD, reccurent, moderate (VS Bipolar 2 disorder, depressed- unlikely) Anxiety, unspecified - Decrease Abilify to 5mg  daily - Continue Wellbutrin XL 150mg  daily - Continue Hydroxyzine 10mg  TID PRN   Labs have been previously reviewed with patient.    Insomnia- acute exacerbation - Restart Trazodone 50mg  QHS PRN   Collaboration of Care: Collaboration of Care:   Patient/Guardian was advised Release of Information must be obtained prior to any record release in order to collaborate their care with an outside provider. Patient/Guardian was advised if they have not already done so to contact the registration department to sign all necessary forms in order for [place] to release information regarding their care.   Consent: Patient/Guardian gives verbal consent for treatment and assignment of benefits for services provided during this visit. Patient/Guardian expressed  understanding and agreed to proceed.   PGY-3 Randy Morton, MD 02/25/2023, 8:35 AM

## 2023-03-01 ENCOUNTER — Other Ambulatory Visit: Payer: Self-pay

## 2023-03-09 ENCOUNTER — Other Ambulatory Visit: Payer: Self-pay

## 2023-03-29 ENCOUNTER — Other Ambulatory Visit: Payer: Self-pay

## 2023-03-30 ENCOUNTER — Ambulatory Visit (INDEPENDENT_AMBULATORY_CARE_PROVIDER_SITE_OTHER): Payer: No Payment, Other | Admitting: Licensed Clinical Social Worker

## 2023-03-30 DIAGNOSIS — F5104 Psychophysiologic insomnia: Secondary | ICD-10-CM

## 2023-03-30 DIAGNOSIS — F331 Major depressive disorder, recurrent, moderate: Secondary | ICD-10-CM

## 2023-03-30 DIAGNOSIS — F3132 Bipolar disorder, current episode depressed, moderate: Secondary | ICD-10-CM | POA: Diagnosis not present

## 2023-03-30 NOTE — Progress Notes (Signed)
THERAPIST PROGRESS NOTE  Session Time: 85   Participation Level: Active  Behavioral Response: CasualAlertAnxious and Depressed  Type of Therapy: Individual Therapy  Treatment Goals addressed:  Active     Bipolar Disorder CCP Problem  1 bipolar disorder depressed      Decrease PHQ-9 below 10  (Progressing)     Start:  12/08/21    Expected End:  05/27/23         Decrease GAD-7 below 5  (Progressing)     Start:  12/08/21    Expected End:  05/27/23         LTG: Stabilize mood and increase goal-directed behavior: Using positive affirmation 1 x daily  (Progressing)     Start:  12/08/21    Expected End:  05/27/23         STG: Demarion WILL COOPERATE WITH A PSYCHIATRIC EVALUATION ON 04/28/21 AND DURING ALL FOLLOW-UP VISITS (Completed/Met)     Start:  12/08/21    Expected End:  05/21/22    Resolved:  12/30/21      STG: [name] WILL IDENTIFY COGNITIVE PATTERNS AND BELIEFS THAT INTERFERE WITH THERAPY (Progressing)     Start:  12/08/21    Expected End:  05/27/23       Goal Note     Catastrophic language         STG: Yeshaya WILL ATTEND AT LEAST 80% OF SCHEDULED FOLLOW-UP MEDICATION MANAGEMENT APPOINTMENTS (Completed/Met)     Start:  12/08/21    Expected End:  11/26/22    Resolved:  09/07/22       Decrease marijuana use to 0/7 days per week  (Progressing)     Start:  06/02/22    Expected End:  05/27/23               ProgressTowards Goals: Progressing  Interventions: CBT and Motivational Interviewing  Summary: Chan Boggs is a 37 y.o. male who presents with depressed and anxious mood\affect.  Patient was pleasant, cooperative, maintained good eye contact.  He engaged well in therapy session which was casually.  Patient reports primary stressors in his insurance.  Patient reports he did not follow-[facility] with [facility] due to the fact that he does not want to switch his primary care physician.  LCSW recommended  to patient that [facility] be seen at [facility] because he does not have insurance and needed to go through the process financial hardship applications if he wanted to be seen at other offices.   Patient reports that he has signed up for insurance that he found outside of a bus depot in [place].  Patient reports this is through [employer].  LCSW solution-focused therapy for patient to be connected with Counsellor at [facility].  LCSW and patient spoke about patient's symptoms and patient endorses symptoms for insomnia, tension, and worry.  Patient reports poor sleep and also reports night terrors.  Suicidal/Homicidal: Nowithout intent/plan  Therapist Response:    Intervention/Plan: LCSW psychoanalytic therapy for patient to express thoughts, feelings and emotions using free association.  LCSW used person centered therapy for empowerment.  LCSW supportive therapy for praise and encouragement.  LCSW educated patient on differential diagnosis is for physical versus mental health and home physical health needed to be assessed prior to mental health.  LCSW educated patient on taking medication 7 out of 7 days/week.  LCSW solution-focused therapy connecting patient with Counsellor at [facility].  Plan: Return again in 4 weeks.  Diagnosis: Psychophysiological insomnia  Bipolar affective disorder, currently depressed, moderate (HCC)  Major depressive disorder, recurrent episode, moderate (HCC)  Collaboration of Care: Other Referral to Medicaid team at [facility]   Patient/Guardian was advised Release of Information must be obtained prior to any record release in order to collaborate their care with an outside provider. Patient/Guardian was advised if they have not already done so to contact the registration department to sign all necessary forms in order for [place] to release information  regarding their care.   Consent: Patient/Guardian gives verbal consent for treatment and assignment of benefits for services provided during this visit. Patient/Guardian expressed understanding and agreed to proceed.   Weber Cooks, LCSW 03/30/2023

## 2023-04-11 ENCOUNTER — Telehealth (INDEPENDENT_AMBULATORY_CARE_PROVIDER_SITE_OTHER): Payer: No Payment, Other | Admitting: Student in an Organized Health Care Education/Training Program

## 2023-04-11 ENCOUNTER — Encounter (HOSPITAL_COMMUNITY): Payer: Self-pay | Admitting: Student in an Organized Health Care Education/Training Program

## 2023-04-11 DIAGNOSIS — F331 Major depressive disorder, recurrent, moderate: Secondary | ICD-10-CM | POA: Diagnosis not present

## 2023-04-11 DIAGNOSIS — F411 Generalized anxiety disorder: Secondary | ICD-10-CM

## 2023-04-11 DIAGNOSIS — F5104 Psychophysiologic insomnia: Secondary | ICD-10-CM

## 2023-04-11 MED ORDER — TRAZODONE HCL 50 MG PO TABS
50.0000 mg | ORAL_TABLET | Freq: Every day | ORAL | 3 refills | Status: DC
Start: 2023-04-11 — End: 2023-06-09
  Filled 2023-04-11 – 2023-04-12 (×3): qty 30, 30d supply, fill #0

## 2023-04-11 MED ORDER — BUPROPION HCL ER (XL) 150 MG PO TB24
150.0000 mg | ORAL_TABLET | ORAL | 2 refills | Status: DC
Start: 2023-04-11 — End: 2023-06-09
  Filled 2023-04-11 – 2023-04-12 (×3): qty 30, 30d supply, fill #0
  Filled 2023-05-11: qty 30, 30d supply, fill #1

## 2023-04-11 MED ORDER — ARIPIPRAZOLE 5 MG PO TABS
5.0000 mg | ORAL_TABLET | Freq: Every day | ORAL | 2 refills | Status: DC
Start: 2023-04-11 — End: 2023-06-09
  Filled 2023-04-11 – 2023-04-29 (×2): qty 30, 30d supply, fill #0
  Filled 2023-05-24: qty 30, 30d supply, fill #1

## 2023-04-11 NOTE — Progress Notes (Signed)
Virtual Visit via Video Note  I connected with Randy Braun on 04/11/23 at  2:00 PM EDT by a video enabled telemedicine application and verified that I am speaking with the correct person using two identifiers.  Location: Patient: Work Provider: Office   I discussed the limitations of evaluation and management by telemedicine and the availability of in person appointments. The patient expressed understanding and agreed to proceed.     I discussed the assessment and treatment plan with the patient. The patient was provided an opportunity to ask questions and all were answered. The patient agreed with the plan and demonstrated an understanding of the instructions.   The patient was advised to call back or seek an in-person evaluation if the symptoms worsen or if the condition fails to improve as anticipated.  I provided 20 minutes of non-face-to-face time during this encounter.   [name], MD  [facility] MD/PA/NP OP Progress Note  04/11/2023 4:59 PM [name]  MRN:  [id number]  Chief Complaint:  Chief Complaint  Patient presents with   Follow-up   HPI:  Randy Braun is a 37 year old male with a past psychiatric history significant for bipolar disorder? (Most likely MDD), anxiety, and insomnia who presents to [facility] via virtual telephone visit for follow-up and medication management.  Patient is currently being managed on the following medications:    Abilify 5mg  daily Hydroxyzine 10mg  TID PRN Trazodone 50mg  QHS PRN Wellbutrin XL 150mg  daily  Trazodone 50mg  at bedtime PRN   Patient reports that he has not been taking the Hydroxyzine due to oversedation.  Patient reports that he is having some physical ailments, he was recently approved for Medicaid as well, so he will work on getting seen. Patient reports that he continues to be at the same job, and he is still at the job. Patient reports he has had interviews for new jobs.   Patient reports that he has not noticed any negative changes with decreasing Abilify. Patient reports that he does feel like he has a lot going on in his life, he has some days where he wakes up and does not want to be him. Patient reports that he feels like he is alone and had no supported. Patient reports that he has these thoughts and feeling 4/7 days in a week. Patient endorses that he really feels lonely. Patient denies SI and HI. Patient reports that his back pain is leading to him having low energy. Patient endorses that in regards to his lonely feeling, he does not have social anxiety d/o but he does prefer to go to places in groups because it makes it less like that he has to interact with other. Patient denies AVH. Patient reports that he has also not been sleeping well, without trazodone. He has started to take the medication nightly. Patient reports he is having frequent night time awakenings.  He is averaging around 5-6h. Patient reports that his appetite is stable, he denies fluctuation in weight.   Patient denies significant irritability. Patient reports that he does enjoy watching wrestling.    Visit Diagnosis:    ICD-10-CM   1. MDD (major depressive disorder), recurrent episode, moderate (HCC)  F33.1     2. Psychophysiological insomnia  F51.04     3. Anxiety state  F41.1       Past Psychiatric History: Bipolar disorder Insomnia Anxiety   INPT: Adolescent x1 for behavior/ aggression   Last visit 08/2022-patient endorsing dysphoric symptoms while on medications  as well as mild anxiety but was able to associate anxiety with workplace.  Patient started on Wellbutrin XL 150 mg to address symptoms continued on Abilify 10 mg daily and hydroxyzine.   10/2021- Patient MDD appears to be in partial remission and is anxiety is also in remission. Again via discussion is does not appear patient has bipolar disorder, but did have hx of IED which may have contributed to his impulsive behavior  that led to previous psychiatric hospitalization. Will not adjust Abilify at this time, would like to continue to monitor if patient is stable at next visit , will attempt to decrease  02/2023- Patient doing well, interested in decreasing Abilify, low concern for bipolat d/o . Continued wellbutrin XL 150mg  and Hydroxyzine 10mg  TID PRN, restart trazodone due to insomnia, but also work place related anxiety was noted to be keeping him up  Past Medical History:  Past Medical History:  Diagnosis Date   Anxiety    Depression     Past Surgical History:  Procedure Laterality Date   NO PAST SURGERIES      Family Psychiatric History: Unknown   Family History:  Family History  Family history unknown: Yes    Social History:  Social History   Socioeconomic History   Marital status: Single    Spouse name: Not on file   Number of children: Not on file   Years of education: Not on file   Highest education level: Not on file  Occupational History   Not on file  Tobacco Use   Smoking status: Every Day    Types: Cigarettes   Smokeless tobacco: Never   Tobacco comments:    Patient is now vaping (05/06/2022)  Vaping Use   Vaping Use: Every day   Substances: Nicotine, THC  Substance and Sexual Activity   Alcohol use: Yes    Comment: occasionally   Drug use: Yes    Types: Marijuana   Sexual activity: Not on file  Other Topics Concern   Not on file  Social History Narrative   Not on file   Social Determinants of Health   Financial Resource Strain: Medium Risk (04/28/2021)   Overall Financial Resource Strain (CARDIA)    Difficulty of Paying Living Expenses: Somewhat hard  Food Insecurity: No Food Insecurity (04/28/2021)   Hunger Vital Sign    Worried About Running Out of Food in the Last Year: Never true    Ran Out of Food in the Last Year: Never true  Transportation Needs: No Transportation Needs (04/28/2021)   PRAPARE - Administrator, Civil Service (Medical): No    Lack  of Transportation (Non-Medical): No  Physical Activity: Inactive (04/28/2021)   Exercise Vital Sign    Days of Exercise per Week: 0 days    Minutes of Exercise per Session: 0 min  Stress: Stress Concern Present (04/28/2021)   [employer] of Occupational Health - Occupational Stress Questionnaire    Feeling of Stress : To some extent  Social Connections: Socially Isolated (04/28/2021)   Social Connection and Isolation Panel [NHANES]    Frequency of Communication with Friends and Family: More than three times a week    Frequency of Social Gatherings with Friends and Family: Once a week    Attends Religious Services: Never    Database administrator or Organizations: No    Attends Banker Meetings: Never    Marital Status: Never married    Allergies:  Allergies  Allergen Reactions  Vinegar [Acetic Acid] Other (See Comments)    Red Vinegar- flush    Metabolic Disorder Labs: Lab Results  Component Value Date   HGBA1C 5.8 (H) 12/14/2022   No results found for: "PROLACTIN" Lab Results  Component Value Date   CHOL 177 12/14/2022   TRIG 69 12/14/2022   HDL 58 12/14/2022   CHOLHDL 3.1 12/14/2022   LDLCALC 106 (H) 12/14/2022   LDLCALC 67 02/27/2021   Lab Results  Component Value Date   TSH 1.420 12/14/2022   TSH 1.740 11/24/2020    Therapeutic Level Labs: No results found for: "LITHIUM" No results found for: "VALPROATE" No results found for: "CBMZ"  Current Medications: Current Outpatient Medications  Medication Sig Dispense Refill   amoxicillin (AMOXIL) 500 MG capsule Take 1 capsule by mouth once every 8 hours until finished. 21 capsule 0   ARIPiprazole (ABILIFY) 5 MG tablet Take 1 tablet (5 mg total) by mouth daily. 30 tablet 2   baclofen (LIORESAL) 10 MG tablet Take 0.5-1 tablets (5-10 mg total) by mouth 3 (three) times daily as needed for muscle spasms. 30 each 3   buPROPion (WELLBUTRIN XL) 150 MG 24 hr tablet Take 1 tablet (150 mg total) by mouth every  morning. 30 tablet 2   chlorhexidine (PERIDEX) 0.12 % solution Swish for one minute and expectorate twice daily for 10 days. 473 mL 0   diclofenac (VOLTAREN) 75 MG EC tablet Take 1 tablet (75 mg total) by mouth 2 (two) times daily as needed. 60 tablet 3   ibuprofen (ADVIL) 800 MG tablet Take 800 mg by mouth every 8 (eight) hours as needed.     ibuprofen (IBU) 800 MG tablet Take 1 tablet by mouth once every 8 hours as needed for pain. 20 tablet 1   meloxicam (MOBIC) 7.5 MG tablet Take 7.5 mg by mouth daily.     traZODone (DESYREL) 50 MG tablet Take 1 tablet (50 mg total) by mouth at bedtime. 30 tablet 3   No current facility-administered medications for this visit.     Psychiatric Specialty Exam: Review of Systems  Psychiatric/Behavioral:  Positive for dysphoric mood and sleep disturbance. Negative for hallucinations and suicidal ideas. The patient is nervous/anxious.     There were no vitals taken for this visit.There is no height or weight on file to calculate BMI.  General Appearance: Casual  Eye Contact:  Good  Speech:  Clear and Coherent  Volume:  Normal  Mood:  Euthymic  Affect:  Appropriate  Thought Process:  Coherent  Orientation:  Full (Time, Place, and Person)  Thought Content: Logical   Suicidal Thoughts:  No  Homicidal Thoughts:  No  Memory:  Immediate;   Good Recent;   Good  Judgement:  Good  Insight:  Fair  Psychomotor Activity:  Decreased  Concentration:  Concentration: Good  Recall:  NA  Fund of Knowledge: Good  Language: Good  Akathisia:  NA  Handed:    AIMS (if indicated): not done  Assets:  Communication Skills Desire for Improvement Housing Leisure Time Resilience Transportation  ADL's:  Intact  Cognition: WNL  Sleep:  Fair   Screenings: GAD-7    Advertising copywriter from 11/10/2022 in [facility] Counselor from 07/07/2022 in [facility] Video Visit from 03/04/2022 in [facility] Counselor from 02/16/2022 in [facility] Counselor from 01/26/2022 in [facility]  Total GAD-7 Score 1 9 5 3  8  [id number]    Flowsheet Row Counselor from 11/10/2022 in [facility] Counselor from 07/07/2022 in [facility] Video Visit from 03/04/2022 in [facility] Counselor from 02/16/2022 in [facility] Counselor from 01/26/2022 in [facility]  PHQ-2 Total Score 0 2 0 0 3  PHQ-9 Total Score 1 4 -- 2 6      Flowsheet Row Video Visit from 03/04/2022 in [facility] Video Visit from 12/31/2021 in [facility] Video Visit from 11/05/2021 in [facility]  C-SSRS RISK CATEGORY Low Risk Low Risk Moderate Risk        Assessment and Plan: Patient does not appear to endorse any symptoms of decompensation, since decreasing Abilify to 5 mg however, he does continue to endorse depressive symptoms.  While patient has insight to recognized that a lot of this is due to external factors, patient also endorsed feelings of loneliness.  Patient willing to make behavior modifications to address some of the reasons for his dysphoric mood however, given patient endorsing feeling these ways for out of 7 days, we will not discontinue Abilify at this time and will continue current medications while patient attempts to make adjustments in his life such as changing jobs and looking for people with similar interest to his. Patient endorsed that he was not aware that he was lonely until discussed on appt today, and this new insight made him feel that he could address this with behavior changes.  Also recommended that during time while patient is looking for a new job, he take trazodone nightly.  We will discontinue patient's  hydroxyzine, as patient is not taking it due to it being over sedating.  Do recommend at next visit reevaluating the possibility of downward titration off of Abilify.   MDD, reccurent, moderate (VS Bipolar 2 disorder, depressed- unlikely) Anxiety, unspecified - Continue Abilify to 5mg  daily - Continue Wellbutrin XL 150mg  daily - Discontinue Hydroxyzine 10mg  TID PRN    Insomnia- acute exacerbation - Continue Trazodone 50mg  QHS   Discussed with patient pending transition of care to a new resident starting July 1st, and likely discontinuation of care by this provider at that time.    F/u with Dr. Hazle Quant in 05/2023  Collaboration of Care: Collaboration of Care:   Patient/Guardian was advised Release of Information must be obtained prior to any record release in order to collaborate their care with an outside provider. Patient/Guardian was advised if they have not already done so to contact the registration department to sign all necessary forms in order for [place] to release information regarding their care.   Consent: Patient/Guardian gives verbal consent for treatment and assignment of benefits for services provided during this visit. Patient/Guardian expressed understanding and agreed to proceed.   PGY-3 [name], MD 04/11/2023, 4:59 PM

## 2023-04-12 ENCOUNTER — Other Ambulatory Visit: Payer: Self-pay

## 2023-04-13 ENCOUNTER — Other Ambulatory Visit: Payer: Self-pay

## 2023-04-19 ENCOUNTER — Ambulatory Visit (INDEPENDENT_AMBULATORY_CARE_PROVIDER_SITE_OTHER): Payer: Medicaid Other | Admitting: Family

## 2023-04-19 ENCOUNTER — Other Ambulatory Visit: Payer: Self-pay

## 2023-04-19 ENCOUNTER — Encounter: Payer: Self-pay | Admitting: Family

## 2023-04-19 VITALS — BP 112/78 | HR 99 | Temp 98.0°F | Ht 70.0 in | Wt 200.2 lb

## 2023-04-19 DIAGNOSIS — Z0000 Encounter for general adult medical examination without abnormal findings: Secondary | ICD-10-CM

## 2023-04-19 DIAGNOSIS — Z130 Encounter for screening for diseases of the blood and blood-forming organs and certain disorders involving the immune mechanism: Secondary | ICD-10-CM

## 2023-04-19 DIAGNOSIS — M25572 Pain in left ankle and joints of left foot: Secondary | ICD-10-CM

## 2023-04-19 DIAGNOSIS — Z1329 Encounter for screening for other suspected endocrine disorder: Secondary | ICD-10-CM

## 2023-04-19 DIAGNOSIS — G8929 Other chronic pain: Secondary | ICD-10-CM

## 2023-04-19 DIAGNOSIS — R7303 Prediabetes: Secondary | ICD-10-CM

## 2023-04-19 DIAGNOSIS — Z13228 Encounter for screening for other metabolic disorders: Secondary | ICD-10-CM

## 2023-04-19 DIAGNOSIS — Z13220 Encounter for screening for lipoid disorders: Secondary | ICD-10-CM

## 2023-04-19 MED ORDER — MELOXICAM 7.5 MG PO TABS
7.5000 mg | ORAL_TABLET | Freq: Every day | ORAL | 1 refills | Status: AC
Start: 2023-04-19 — End: ?
  Filled 2023-04-19: qty 30, 30d supply, fill #0

## 2023-04-19 MED ORDER — PREDNISONE 10 MG PO TABS
ORAL_TABLET | ORAL | 0 refills | Status: AC
Start: 2023-04-19 — End: 2023-04-26
  Filled 2023-04-19: qty 21, 6d supply, fill #0

## 2023-04-19 NOTE — Progress Notes (Signed)
Patient ID: Randy Braun, male    DOB: 13-Mar-1986  MRN: [id number]  CC: Annual Exam  Subjective: Randy Braun is a 37 y.o. male who presents for annual exam.   His concerns today include:  Chronic right lower back pain persisting. More recently bilateral ankle pain. States thinks pain related to working long hours. Since then he has decreased work hours. He denies recent trauma/injury and red flag symptoms. Taking over-the-counter Tylenol with minimal relief. He was established with Orthopedics in the past for management.   Patient Active Problem List   Diagnosis Date Noted   Insomnia due to other mental disorder 05/05/2021   Intermittent explosive disorder 04/28/2021   Major depressive disorder, recurrent episode, moderate (HCC) 04/28/2021   Cyst of skin 03/26/2021   Chronic right-sided low back pain with right-sided sciatica 11/24/2020   Anxiety state 11/24/2020   Psychophysiological insomnia 11/24/2020     Current Outpatient Medications on File Prior to Visit  Medication Sig Dispense Refill   ARIPiprazole (ABILIFY) 5 MG tablet Take 1 tablet (5 mg total) by mouth daily. 30 tablet 2   buPROPion (WELLBUTRIN XL) 150 MG 24 hr tablet Take 1 tablet (150 mg total) by mouth every morning. 30 tablet 2   ibuprofen (IBU) 800 MG tablet Take 1 tablet by mouth once every 8 hours as needed for pain. 20 tablet 1   traZODone (DESYREL) 50 MG tablet Take 1 tablet (50 mg total) by mouth at bedtime. 30 tablet 3   amoxicillin (AMOXIL) 500 MG capsule Take 1 capsule by mouth once every 8 hours until finished. (Patient not taking: Reported on 04/19/2023) 21 capsule 0   baclofen (LIORESAL) 10 MG tablet Take 0.5-1 tablets (5-10 mg total) by mouth 3 (three) times daily as needed for muscle spasms. (Patient not taking: Reported on 04/19/2023) 30 each 3   chlorhexidine (PERIDEX) 0.12 % solution Swish for one minute and expectorate twice daily for 10 days. (Patient not taking: Reported on 04/19/2023) 473 mL 0    diclofenac (VOLTAREN) 75 MG EC tablet Take 1 tablet (75 mg total) by mouth 2 (two) times daily as needed. (Patient not taking: Reported on 04/19/2023) 60 tablet 3   ibuprofen (ADVIL) 800 MG tablet Take 800 mg by mouth every 8 (eight) hours as needed.     No current facility-administered medications on file prior to visit.    Allergies  Allergen Reactions   Vinegar [Acetic Acid] Other (See Comments)    Red Vinegar- flush    Social History   Socioeconomic History   Marital status: Single    Spouse name: Not on file   Number of children: Not on file   Years of education: Not on file   Highest education level: Not on file  Occupational History   Not on file  Tobacco Use   Smoking status: Every Day    Types: Cigarettes   Smokeless tobacco: Never   Tobacco comments:    Patient is now vaping (05/06/2022)  Vaping Use   Vaping Use: Every day   Substances: Nicotine, THC  Substance and Sexual Activity   Alcohol use: Yes    Comment: occasionally   Drug use: Yes    Types: Marijuana   Sexual activity: Not on file  Other Topics Concern   Not on file  Social History Narrative   Not on file   Social Determinants of Health   Financial Resource Strain: Medium Risk (04/28/2021)   Overall Financial Resource Strain (CARDIA)    Difficulty  of Paying Living Expenses: Somewhat hard  Food Insecurity: No Food Insecurity (04/28/2021)   Hunger Vital Sign    Worried About Running Out of Food in the Last Year: Never true    Ran Out of Food in the Last Year: Never true  Transportation Needs: No Transportation Needs (04/28/2021)   PRAPARE - Administrator, Civil Service (Medical): No    Lack of Transportation (Non-Medical): No  Physical Activity: Inactive (04/28/2021)   Exercise Vital Sign    Days of Exercise per Week: 0 days    Minutes of Exercise per Session: 0 min  Stress: Stress Concern Present (04/28/2021)   [employer] of Occupational Health - Occupational Stress Questionnaire     Feeling of Stress : To some extent  Social Connections: Socially Isolated (04/28/2021)   Social Connection and Isolation Panel [NHANES]    Frequency of Communication with Friends and Family: More than three times a week    Frequency of Social Gatherings with Friends and Family: Once a week    Attends Religious Services: Never    Database administrator or Organizations: No    Attends Banker Meetings: Never    Marital Status: Never married  Intimate Partner Violence: Not At Risk (04/28/2021)   Humiliation, Afraid, Rape, and Kick questionnaire    Fear of Current or Ex-Partner: No    Emotionally Abused: No    Physically Abused: No    Sexually Abused: No    Family History  Family history unknown: Yes    Past Surgical History:  Procedure Laterality Date   NO PAST SURGERIES      ROS: Review of Systems Negative except as stated above  PHYSICAL EXAM: BP 112/78   Pulse 99   Temp 98 F (36.7 C)   Ht 5\' 10"  (1.778 m)   Wt 200 lb 3.2 oz (90.8 kg)   SpO2 94%   BMI 28.73 kg/m   Physical Exam HENT:     Head: Normocephalic and atraumatic.     Right Ear: Tympanic membrane, ear canal and external ear normal.     Left Ear: Tympanic membrane, ear canal and external ear normal.     Nose: Nose normal.     Mouth/Throat:     Mouth: Mucous membranes are moist.     Pharynx: Oropharynx is clear.  Eyes:     Extraocular Movements: Extraocular movements intact.     Conjunctiva/sclera: Conjunctivae normal.     Pupils: Pupils are equal, round, and reactive to light.  Cardiovascular:     Rate and Rhythm: Normal rate and regular rhythm.     Pulses: Normal pulses.     Heart sounds: Normal heart sounds.  Pulmonary:     Effort: Pulmonary effort is normal.     Breath sounds: Normal breath sounds.  Abdominal:     General: Bowel sounds are normal.     Palpations: Abdomen is soft.  Genitourinary:    Comments: Patient declined.  Musculoskeletal:        General: Normal range of  motion.     Right shoulder: Normal.     Left shoulder: Normal.     Right upper arm: Normal.     Left upper arm: Normal.     Right elbow: Normal.     Left elbow: Normal.     Right forearm: Normal.     Left forearm: Normal.     Right wrist: Normal.     Left wrist: Normal.  Right hand: Normal.     Left hand: Normal.     Cervical back: Normal, normal range of motion and neck supple.     Thoracic back: Normal.     Lumbar back: Tenderness present.     Right hip: Normal.     Left hip: Normal.     Right upper leg: Normal.     Left upper leg: Normal.     Right knee: Normal.     Left knee: Normal.     Right lower leg: Normal.     Left lower leg: Normal.     Right ankle: Normal.     Left ankle: Normal.     Right foot: Normal.     Left foot: Normal.  Skin:    General: Skin is warm and dry.     Capillary Refill: Capillary refill takes less than 2 seconds.  Neurological:     General: No focal deficit present.     Mental Status: He is alert and oriented to person, place, and time.  Psychiatric:        Mood and Affect: Mood normal.        Behavior: Behavior normal.    ASSESSMENT AND PLAN: 1. Annual physical exam - Counseled on 150 minutes of exercise per week as tolerated, healthy eating (including decreased daily intake of saturated fats, cholesterol, added sugars, sodium), STI prevention, and routine healthcare maintenance.   2. Screening for metabolic disorder - Routine screening.  - CMP14+EGFR  3. Screening for deficiency anemia - Routine screening.  - CBC  4. Screening cholesterol level - Routine screening.  - Lipid panel  5. Thyroid disorder screen - Routine screening.  - TSH  6. Prediabetes - Routine screening.  - Hemoglobin A1c  7. Chronic right-sided low back pain with right-sided sciatica 8. Acute bilateral ankle pain - Meloxicam and Prednisone as prescribed. Counseled on medication adherence/adverse effects.  - Referral to Orthopedics for further  evaluation/management. During the interim follow-up with primary provider in 4 weeks or sooner if needed until established with referral. - Ambulatory referral to Orthopedics - meloxicam (MOBIC) 7.5 MG tablet; Take 1 tablet (7.5 mg total) by mouth daily.  Dispense: 30 tablet; Refill: 1 - predniSONE (DELTASONE) 10 MG tablet; Take 6 tablets (60 mg total) by mouth daily with breakfast for 1 day, THEN 5 tablets (50 mg total) daily with breakfast for 1 day, THEN 4 tablets (40 mg total) daily with breakfast for 1 day, THEN 3 tablets (30 mg total) daily with breakfast for 1 day, THEN 2 tablets (20 mg total) daily with breakfast for 1 day, THEN 1 tablet (10 mg total) daily with breakfast for 1 day.  Dispense: 21 tablet; Refill: 0    Patient was given the opportunity to ask questions.  Patient verbalized understanding of the plan and was able to repeat key elements of the plan. Patient was given clear instructions to go to Emergency Department or return to medical center if symptoms don't improve, worsen, or new problems develop.The patient verbalized understanding.   Orders Placed This Encounter  Procedures   CBC   Lipid panel   TSH   CMP14+EGFR   Hemoglobin A1c   Ambulatory referral to Orthopedics     Requested Prescriptions   Signed Prescriptions Disp Refills   meloxicam (MOBIC) 7.5 MG tablet 30 tablet 1    Sig: Take 1 tablet (7.5 mg total) by mouth daily.   predniSONE (DELTASONE) 10 MG tablet 21 tablet 0    Sig:  Take 6 tablets (60 mg total) by mouth daily with breakfast for 1 day, THEN 5 tablets (50 mg total) daily with breakfast for 1 day, THEN 4 tablets (40 mg total) daily with breakfast for 1 day, THEN 3 tablets (30 mg total) daily with breakfast for 1 day, THEN 2 tablets (20 mg total) daily with breakfast for 1 day, THEN 1 tablet (10 mg total) daily with breakfast for 1 day.    Return in about 1 year (around 04/18/2024) for Physical per patient preference .  Rema Fendt, NP

## 2023-04-19 NOTE — Patient Instructions (Signed)
Preventive Care 21-37 Years Old, Male Preventive care refers to lifestyle choices and visits with your health care provider that can promote health and wellness. Preventive care visits are also called wellness exams. What can I expect for my preventive care visit? Counseling During your preventive care visit, your health care provider may ask about your: Medical history, including: Past medical problems. Family medical history. Current health, including: Emotional well-being. Home life and relationship well-being. Sexual activity. Lifestyle, including: Alcohol, nicotine or tobacco, and drug use. Access to firearms. Diet, exercise, and sleep habits. Safety issues such as seatbelt and bike helmet use. Sunscreen use. Work and work environment. Physical exam Your health care provider may check your: Height and weight. These may be used to calculate your BMI (body mass index). BMI is a measurement that tells if you are at a healthy weight. Waist circumference. This measures the distance around your waistline. This measurement also tells if you are at a healthy weight and may help predict your risk of certain diseases, such as type 2 diabetes and high blood pressure. Heart rate and blood pressure. Body temperature. Skin for abnormal spots. What immunizations do I need?  Vaccines are usually given at various ages, according to a schedule. Your health care provider will recommend vaccines for you based on your age, medical history, and lifestyle or other factors, such as travel or where you work. What tests do I need? Screening Your health care provider may recommend screening tests for certain conditions. This may include: Lipid and cholesterol levels. Diabetes screening. This is done by checking your blood sugar (glucose) after you have not eaten for a while (fasting). Hepatitis B test. Hepatitis C test. HIV (human immunodeficiency virus) test. STI (sexually transmitted infection)  testing, if you are at risk. Talk with your health care provider about your test results, treatment options, and if necessary, the need for more tests. Follow these instructions at home: Eating and drinking  Eat a healthy diet that includes fresh fruits and vegetables, whole grains, lean protein, and low-fat dairy products. Drink enough fluid to keep your urine pale yellow. Take vitamin and mineral supplements as recommended by your health care provider. Do not drink alcohol if your health care provider tells you not to drink. If you drink alcohol: Limit how much you have to 0-2 drinks a day. Know how much alcohol is in your drink. In the [place] one drink equals one 12 oz bottle of beer (355 mL), one 5 oz glass of wine (148 mL), or one 1 oz glass of hard liquor (44 mL). Lifestyle Brush your teeth every morning and night with fluoride toothpaste. Floss one time each day. Exercise for at least 30 minutes 5 or more days each week. Do not use any products that contain nicotine or tobacco. These products include cigarettes, chewing tobacco, and vaping devices, such as e-cigarettes. If you need help quitting, ask your health care provider. Do not use drugs. If you are sexually active, practice safe sex. Use a condom or other form of protection to prevent STIs. Find healthy ways to manage stress, such as: Meditation, yoga, or listening to music. Journaling. Talking to a trusted person. Spending time with friends and family. Minimize exposure to UV radiation to reduce your risk of skin cancer. Safety Always wear your seat belt while driving or riding in a vehicle. Do not drive: If you have been drinking alcohol. Do not ride with someone who has been drinking. If you have been using any mind-altering substances   or drugs. While texting. When you are tired or distracted. Wear a helmet and other protective equipment during sports activities. If you have firearms in your house, make sure you  follow all gun safety procedures. Seek help if you have been physically or sexually abused. What's next? Go to your health care provider once a year for an annual wellness visit. Ask your health care provider how often you should have your eyes and teeth checked. Stay up to date on all vaccines. This information is not intended to replace advice given to you by your health care provider. Make sure you discuss any questions you have with your health care provider. Document Revised: 04/08/2021 Document Reviewed: 04/08/2021 Elsevier Patient Education  2024 Elsevier Inc.  

## 2023-04-19 NOTE — Progress Notes (Signed)
Pt state having pain in lower back, for about 5-6 weeks. Taking Ibuprofen and that has been helping.  Pt states pain in ankles, mainly after shifts at work.

## 2023-04-20 ENCOUNTER — Ambulatory Visit (INDEPENDENT_AMBULATORY_CARE_PROVIDER_SITE_OTHER): Payer: No Payment, Other | Admitting: Licensed Clinical Social Worker

## 2023-04-20 ENCOUNTER — Telehealth (HOSPITAL_COMMUNITY): Payer: Self-pay | Admitting: Licensed Clinical Social Worker

## 2023-04-20 ENCOUNTER — Other Ambulatory Visit: Payer: Self-pay

## 2023-04-20 DIAGNOSIS — F331 Major depressive disorder, recurrent, moderate: Secondary | ICD-10-CM | POA: Diagnosis not present

## 2023-04-20 LAB — CMP14+EGFR
ALT: 29 IU/L (ref 0–44)
AST: 29 IU/L (ref 0–40)
Albumin: 5 g/dL (ref 4.1–5.1)
Alkaline Phosphatase: 72 IU/L (ref 44–121)
BUN/Creatinine Ratio: 9 (ref 9–20)
BUN: 10 mg/dL (ref 6–20)
Bilirubin Total: 0.4 mg/dL (ref 0.0–1.2)
CO2: 24 mmol/L (ref 20–29)
Calcium: 9.9 mg/dL (ref 8.7–10.2)
Chloride: 99 mmol/L (ref 96–106)
Creatinine, Ser: 1.08 mg/dL (ref 0.76–1.27)
Globulin, Total: 2.1 g/dL (ref 1.5–4.5)
Glucose: 79 mg/dL (ref 70–99)
Potassium: 4.4 mmol/L (ref 3.5–5.2)
Sodium: 138 mmol/L (ref 134–144)
Total Protein: 7.1 g/dL (ref 6.0–8.5)
eGFR: 91 mL/min/{1.73_m2} (ref >59)

## 2023-04-20 LAB — HEMOGLOBIN A1C
Est. average glucose Bld gHb Est-mCnc: 128 mg/dL
Hgb A1c MFr Bld: 6.1 % — ABNORMAL HIGH (ref 4.8–5.6)

## 2023-04-20 LAB — CBC
Hematocrit: 44.8 % (ref 37.5–51.0)
Hemoglobin: 14.1 g/dL (ref 13.0–17.7)
MCH: 24.3 pg — ABNORMAL LOW (ref 26.6–33.0)
MCHC: 31.5 g/dL (ref 31.5–35.7)
MCV: 77 fL — ABNORMAL LOW (ref 79–97)
Platelets: 226 10*3/uL (ref 150–450)
RBC: 5.8 x10E6/uL (ref 4.14–5.80)
RDW: 16.1 % — ABNORMAL HIGH (ref 11.6–15.4)
WBC: 5.4 10*3/uL (ref 3.4–10.8)

## 2023-04-20 LAB — LIPID PANEL
Chol/HDL Ratio: 2.9 ratio (ref 0.0–5.0)
Cholesterol, Total: 148 mg/dL (ref 100–199)
HDL: 51 mg/dL (ref >39)
LDL Chol Calc (NIH): 86 mg/dL (ref 0–99)
Triglycerides: 53 mg/dL (ref 0–149)
VLDL Cholesterol Cal: 11 mg/dL (ref 5–40)

## 2023-04-20 LAB — TSH: TSH: 1.4 u[IU]/mL (ref 0.450–4.500)

## 2023-04-20 NOTE — Telephone Encounter (Signed)
This has been taken to [place], I just haven't heard a conclusion yet.

## 2023-04-20 NOTE — Progress Notes (Signed)
THERAPIST PROGRESS NOTE  Session Time: 26  Participation Level: Active  Behavioral Response: CasualAlertAnxious and Depressed  Type of Therapy: Individual Therapy  Treatment Goals addressed:  Active     Bipolar Disorder CCP Problem  1 bipolar disorder depressed      Decrease PHQ-9 below 10  (Progressing)     Start:  12/08/21    Expected End:  05/27/23         Decrease GAD-7 below 5  (Progressing)     Start:  12/08/21    Expected End:  05/27/23         LTG: Stabilize mood and increase goal-directed behavior: Using positive affirmation 1 x daily  (Progressing)     Start:  12/08/21    Expected End:  05/27/23         STG: Klaus WILL COOPERATE WITH A PSYCHIATRIC EVALUATION ON 04/28/21 AND DURING ALL FOLLOW-UP VISITS (Completed/Met)     Start:  12/08/21    Expected End:  05/21/22    Resolved:  12/30/21      STG: UXLKGMWN WILL IDENTIFY COGNITIVE PATTERNS AND BELIEFS THAT INTERFERE WITH THERAPY (Progressing)     Start:  12/08/21    Expected End:  05/27/23         STG: [id number] WILL ATTEND AT LEAST 80% OF SCHEDULED FOLLOW-UP MEDICATION MANAGEMENT APPOINTMENTS (Completed/Met)     Start:  12/08/21    Expected End:  11/26/22    Resolved:  09/07/22       Decrease marijuana use to 0/7 days per week  (Progressing)     Start:  06/02/22    Expected End:  05/27/23                ProgressTowards Goals: Progressing  Interventions: CBT and Motivational Interviewing  Summary: Randy Braun is a 37 y.o. male who presents with depressed and anxious mood\affect.  Patient was pleasant, cooperative, maintained good eye contact.  He engaged well in therapy session was dressed casually. Arvin was alert and oriented x 5.  Patient reports primary stressors as financials, work, and transportation.  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  Patient reports that he has been struggling due to his lack of transportation  and being dependent on public transportation.   Patient reports that he is in a program where they put money into an escrow account from the money he pays and rent towards an overall goal.  Patient reports that goal was to get a license and again reliable transportation.  Patient reports that he has been talking to his case manager which reports that he can utilize the money in his account which patient reports is about $15,000 towards his goal for things such as driving school and fees for licensing exams.  Patient reports that this will help decrease tension and worry as it would open up more job opportunities for more money and more frequent hours.  Suicidal/Homicidal: Nowithout intent/plan  Therapist Response:    Intervention/Plan: LCSW used reframing in session.  LCSW used motivational interviewing for him affirmations, reflective listening, and open-ended questions.  LCSW used person centered therapy for empowerment and unconditional positive regard.  LCSW educated patient on taking medications as prescribed.  LCSW spoke with patient today about taking medications consistently.  LCSW educated patient on how social determinants of health can decrease stress and tension if they are met.    Plan: Return again in 3 weeks.  Diagnosis: Major  depressive disorder, recurrent episode, moderate (HCC)  Collaboration of Care: Other None today   Patient/Guardian was advised Release of Information must be obtained prior to any record release in order to collaborate their care with an outside provider. Patient/Guardian was advised if they have not already done so to contact the registration department to sign all necessary forms in order for [place] to release information regarding their care.   Consent: Patient/Guardian gives verbal consent for treatment and assignment of benefits for services provided during this visit. Patient/Guardian expressed understanding and agreed to proceed.   Weber Cooks,  LCSW 04/20/2023

## 2023-04-20 NOTE — Telephone Encounter (Signed)
Pt requesting call back due to billing for labs that he was told were covered by Dr. Gerlean Ren

## 2023-04-29 ENCOUNTER — Other Ambulatory Visit: Payer: Self-pay

## 2023-04-29 NOTE — Telephone Encounter (Signed)
Advice - Message left for patient, after discussing with Dr. Morrie Sheldon to verify patientis now receiving a [facility] bill for recent lab work done for him. Requested patient bring in any bills he receives and to leave those for this Clinical Nurse Manger to follow up on for patient and left this nurse manager's phone number to call back if needed.

## 2023-05-11 ENCOUNTER — Ambulatory Visit (INDEPENDENT_AMBULATORY_CARE_PROVIDER_SITE_OTHER): Payer: No Payment, Other | Admitting: Licensed Clinical Social Worker

## 2023-05-11 DIAGNOSIS — F331 Major depressive disorder, recurrent, moderate: Secondary | ICD-10-CM

## 2023-05-11 NOTE — Progress Notes (Signed)
THERAPIST PROGRESS NOTE  Session Time: 21   Participation Level: Active  Behavioral Response: CasualAlertAnxious and Depressed  Type of Therapy: Individual Therapy  Treatment Goals addressed:  Active     Bipolar Disorder CCP Problem  1 bipolar disorder depressed      Decrease PHQ-9 below 10  (Progressing)     Start:  12/08/21    Expected End:  05/27/23         Decrease GAD-7 below 5  (Progressing)     Start:  12/08/21    Expected End:  05/27/23         LTG: Stabilize mood and increase goal-directed behavior: Using positive affirmation 1 x daily  (Progressing)     Start:  12/08/21    Expected End:  05/27/23         STG: Randy Braun WILL COOPERATE WITH A PSYCHIATRIC EVALUATION ON 04/28/21 AND DURING ALL FOLLOW-UP VISITS (Completed/Met)     Start:  12/08/21    Expected End:  05/21/22    Resolved:  12/30/21      STG: Randy Braun WILL IDENTIFY COGNITIVE PATTERNS AND BELIEFS THAT INTERFERE WITH THERAPY (Progressing)     Start:  12/08/21    Expected End:  05/27/23         STG: Randy Braun WILL ATTEND AT LEAST 80% OF SCHEDULED FOLLOW-UP MEDICATION MANAGEMENT APPOINTMENTS (Completed/Met)     Start:  12/08/21    Expected End:  11/26/22    Resolved:  09/07/22       Decrease marijuana use to 0/7 days per week  (Progressing)     Start:  06/02/22    Expected End:  05/27/23            WORK WITH Randy Braun TO DISCUSS RISKS AND BENEFITS OF MEDICATION TREATMENT OPTIONS FOR THIS PROBLEM AND PRESCRIBE AS INDICATED (Completed)     Start:  12/08/21    End:  02/16/22      Conduct a review of all medications to evaluate any drug/drug interactions (Completed)     Start:  12/08/21    End:  02/16/22      REVIEW WITH Randy Braun THEIR RESPONSE TO THE PRESCRIBED MEDICATION, INCLUDING ANY SIDE EFFECTS (Completed)     Start:  12/08/21    End:  02/16/22      ENCOURAGE Randy Braun TO KEEP A LOG OF MEDICATION SIDE EFFECTS, QUESTIONS REGARDING MEDICATION, AND SYMPTOMS (Completed)     Start:   12/08/21    End:  02/16/22      INSTRUCT Randy Braun TO TAKE PSYCHOTROPIC MEDICATION AS PRESCRIBED (Completed)     Start:  12/08/21    End:  02/16/22      INSTRUCT Randy Braun TO COMMUNICATE EFFECTS OF PRESCRIBED MEDICATIONS (Completed)     Start:  12/08/21    End:  02/16/22      EDUCATE Randy Braun ON BIPOLAR DISORDER DIAGNOSTIC CRITERIA (Completed)     Start:  12/08/21    End:  02/16/22         ProgressTowards Goals: Progressing  Interventions: CBT, Motivational Interviewing, and Supportive  Summary: Randy Braun is a 37 y.o. male who presents with depressed and anxious mood\affect.  Patient was pleasant, cooperative, maintained good eye contact.  He engaged well in therapy session was dressed casually.  Patient was alert and oriented x 5.  Patient presents today with primary stressors as work.  Patient reports that he found a new job that he contracted.  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  Patient reports that they keep trying to convince him to stay.  LCSW spoke with patient about setting healthy boundaries.  LCSW spoke with patient about associations such as asking for more and setting stipulations on hours if he did work 2 jobs.  Suicidal/Homicidal: Nowithout intent/plan  Therapist Response:     Intervention/Plan: LCSW educated patient on communication.  LCSW educated patient on taking medications as prescribed.  LCSW educated patient on taking medications consistently.  LCSW used supportive therapy for praise and encouragement.  LCSW motivational interviewing for open-ended questions, positive affirmations, and reflective listening.  Plan: Return again in 3 weeks.  Diagnosis: No diagnosis found.  Collaboration of Care: Other None today   Patient/Guardian was advised Release of Information must be obtained prior to any record release in order to collaborate their care with an outside provider. Patient/Guardian was advised if they  have not already done so to contact the registration department to sign all necessary forms in order for [place] to release information regarding their care.   Consent: Patient/Guardian gives verbal consent for treatment and assignment of benefits for services provided during this visit. Patient/Guardian expressed understanding and agreed to proceed.   Randy Cooks, LCSW 05/11/2023

## 2023-05-12 ENCOUNTER — Other Ambulatory Visit: Payer: Self-pay

## 2023-05-17 ENCOUNTER — Encounter: Payer: Self-pay | Admitting: Orthopaedic Surgery

## 2023-05-17 ENCOUNTER — Ambulatory Visit (INDEPENDENT_AMBULATORY_CARE_PROVIDER_SITE_OTHER): Payer: Self-pay | Admitting: Orthopaedic Surgery

## 2023-05-17 VITALS — BP 111/73 | HR 73 | Ht 70.0 in | Wt 200.0 lb

## 2023-05-17 DIAGNOSIS — G8929 Other chronic pain: Secondary | ICD-10-CM

## 2023-05-17 DIAGNOSIS — M5441 Lumbago with sciatica, right side: Secondary | ICD-10-CM

## 2023-05-17 NOTE — Progress Notes (Unsigned)
   Office Visit Note   Patient: Randy Braun           Date of Birth: December 30, 1985           MRN: [id number] Visit Date: 05/17/2023              Requested by: Rema Fendt, NP [address] [place],  [place] [address] PCP: Rema Fendt, NP   Assessment & Plan: Visit Diagnoses: No diagnosis found.  Plan: ***  Follow-Up Instructions: No follow-ups on file.   Orders:  No orders of the defined types were placed in this encounter.  No orders of the defined types were placed in this encounter.     Procedures: No procedures performed   Clinical Data: No additional findings.   Subjective: Chief Complaint  Patient presents with   Lower Back - Pain    HPI  Review of Systems   Objective: Vital Signs: BP 111/73   Pulse 73   Ht 5\' 10"  (1.778 m)   Wt 200 lb (90.7 kg)   BMI 28.70 kg/m   Physical Exam  Ortho Exam  Specialty Comments:  No specialty comments available.  Imaging: No results found.   PMFS History: Patient Active Problem List   Diagnosis Date Noted   Insomnia due to other mental disorder 05/05/2021   Intermittent explosive disorder 04/28/2021   Major depressive disorder, recurrent episode, moderate (HCC) 04/28/2021   Cyst of skin 03/26/2021   Chronic right-sided low back pain with right-sided sciatica 11/24/2020   Anxiety state 11/24/2020   Psychophysiological insomnia 11/24/2020   Past Medical History:  Diagnosis Date   Anxiety    Depression     Family History  Family history unknown: Yes    Past Surgical History:  Procedure Laterality Date   NO PAST SURGERIES     Social History   Occupational History   Not on file  Tobacco Use   Smoking status: Every Day    Types: Cigarettes   Smokeless tobacco: Never   Tobacco comments:    Patient is now vaping (05/06/2022)  Vaping Use   Vaping status: Every Day   Substances: Nicotine, THC  Substance and Sexual Activity   Alcohol use: Yes    Comment: occasionally    Drug use: Yes    Types: Marijuana   Sexual activity: Not on file

## 2023-05-25 ENCOUNTER — Other Ambulatory Visit: Payer: Self-pay

## 2023-06-01 ENCOUNTER — Ambulatory Visit (HOSPITAL_COMMUNITY): Payer: No Payment, Other | Admitting: Licensed Clinical Social Worker

## 2023-06-01 DIAGNOSIS — F331 Major depressive disorder, recurrent, moderate: Secondary | ICD-10-CM

## 2023-06-01 DIAGNOSIS — G4701 Insomnia due to medical condition: Secondary | ICD-10-CM

## 2023-06-01 DIAGNOSIS — F5105 Insomnia due to other mental disorder: Secondary | ICD-10-CM

## 2023-06-01 NOTE — Progress Notes (Signed)
THERAPIST PROGRESS NOTE  Session Time: 68   Participation Level: Active  Behavioral Response: CasualAlertAnxious and Depressed  Type of Therapy: Individual Therapy  Treatment Goals addressed:  Active     Bipolar Disorder CCP Problem  1 bipolar disorder depressed      Decrease PHQ-9 below 10  (Progressing)     Start:  12/08/21    Expected End:  10/25/23         Decrease GAD-7 below 5  (Progressing)     Start:  12/08/21    Expected End:  10/25/23         LTG: Stabilize mood and increase goal-directed behavior: Using positive affirmation 1 x daily  (Progressing)     Start:  12/08/21    Expected End:  10/25/23         STG: Randy Braun WILL COOPERATE WITH A PSYCHIATRIC EVALUATION ON 04/28/21 AND DURING ALL FOLLOW-UP VISITS (Completed/Met)     Start:  12/08/21    Expected End:  05/21/22    Resolved:  12/30/21      STG: Randy Braun WILL IDENTIFY COGNITIVE PATTERNS AND BELIEFS THAT INTERFERE WITH THERAPY (Progressing)     Start:  12/08/21    Expected End:  10/25/23         STG: Randy Braun WILL ATTEND AT LEAST 80% OF SCHEDULED FOLLOW-UP MEDICATION MANAGEMENT APPOINTMENTS (Completed/Met)     Start:  12/08/21    Expected End:  11/26/22    Resolved:  09/07/22       Decrease marijuana use to 0/7 days per week  (Progressing)     Start:  06/02/22    Expected End:  10/25/23            WORK WITH Randy Braun TO DISCUSS RISKS AND BENEFITS OF MEDICATION TREATMENT OPTIONS FOR THIS PROBLEM AND PRESCRIBE AS INDICATED (Completed)     Start:  12/08/21    End:  02/16/22      Conduct a review of all medications to evaluate any drug/drug interactions (Completed)     Start:  12/08/21    End:  02/16/22      REVIEW WITH Randy Braun THEIR RESPONSE TO THE PRESCRIBED MEDICATION, INCLUDING ANY SIDE EFFECTS (Completed)     Start:  12/08/21    End:  02/16/22      ENCOURAGE Randy Braun TO KEEP A LOG OF MEDICATION SIDE EFFECTS, QUESTIONS REGARDING MEDICATION, AND SYMPTOMS (Completed)     Start:   12/08/21    End:  02/16/22      INSTRUCT Randy Braun TO TAKE PSYCHOTROPIC MEDICATION AS PRESCRIBED (Completed)     Start:  12/08/21    End:  02/16/22      INSTRUCT [name] TO COMMUNICATE EFFECTS OF PRESCRIBED MEDICATIONS (Completed)     Start:  12/08/21    End:  02/16/22      EDUCATE Randy Braun ON BIPOLAR DISORDER DIAGNOSTIC CRITERIA (Completed)     Start:  12/08/21    End:  02/16/22         ProgressTowards Goals: Progressing  Interventions: CBT, Motivational Interviewing, and Supportive  Summary: Randy Braun is a 37 y.o. male who presents with depressed and anxious mood\affect.  Patient was pleasant, cooperative, maintained good eye contact.  He engaged well in therapy session and was dressed casually.  Patient reports primary stressors as her relationships and work.  Patient reports that he started a new job at a [employer].  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  Patient reports that he  is still training for his new job.  He reports that he does make less money as far as hourly wages.  Patient does report that he has the ability to make commission and make more money overall than he did at his old job.  Other stressors for patient is finding a relationship.  Patient reports attempting to engage in a relationship through online dating.  Patient reports that he has been unsuccessful and that there is "a lot of prostitution".  Patient reports that he has been recommended to talk to people in person when he goes out and socializes.  Patient reports that he has not talked to a one-on-one male in that capacity since prior to the pandemic.  Suicidal/Homicidal: Nowithout intent/plan  Therapist Response:     Intervention/Plan: LCSW supportive therapy for praise and encouragement.  LCSW use motivational interviewing for open-ended questions and positive affirmations.  LCSW used person centered therapy for empowerment and unconditional positive regard  utilizing nonjudgmental stance.  Patient endorses symptoms for insomnia.  Plan is for patient to speak with provider about medications that will help him stay asleep as they are only helping him fall asleep asleep.  Plan: Return again in 3 weeks.  Diagnosis: Major depressive disorder, recurrent episode, moderate (HCC)  Insomnia due to other mental disorder  Collaboration of Care: Other None today   Patient/Guardian was advised Release of Information must be obtained prior to any record release in order to collaborate their care with an outside provider. Patient/Guardian was advised if they have not already done so to contact the registration department to sign all necessary forms in order for [place] to release information regarding their care.   Consent: Patient/Guardian gives verbal consent for treatment and assignment of benefits for services provided during this visit. Patient/Guardian expressed understanding and agreed to proceed.   Weber Cooks, LCSW 06/01/2023

## 2023-06-09 ENCOUNTER — Encounter (HOSPITAL_COMMUNITY): Payer: Self-pay | Admitting: Student

## 2023-06-09 ENCOUNTER — Other Ambulatory Visit: Payer: Self-pay

## 2023-06-09 ENCOUNTER — Ambulatory Visit (INDEPENDENT_AMBULATORY_CARE_PROVIDER_SITE_OTHER): Payer: No Payment, Other | Admitting: Student

## 2023-06-09 DIAGNOSIS — F5104 Psychophysiologic insomnia: Secondary | ICD-10-CM | POA: Diagnosis not present

## 2023-06-09 DIAGNOSIS — F1290 Cannabis use, unspecified, uncomplicated: Secondary | ICD-10-CM

## 2023-06-09 DIAGNOSIS — F331 Major depressive disorder, recurrent, moderate: Secondary | ICD-10-CM | POA: Diagnosis not present

## 2023-06-09 DIAGNOSIS — F411 Generalized anxiety disorder: Secondary | ICD-10-CM

## 2023-06-09 MED ORDER — BUPROPION HCL ER (XL) 150 MG PO TB24
150.0000 mg | ORAL_TABLET | ORAL | 2 refills | Status: DC
Start: 2023-06-09 — End: 2023-07-19
  Filled 2023-06-09: qty 30, 30d supply, fill #0
  Filled 2023-07-12: qty 30, 30d supply, fill #1

## 2023-06-09 MED ORDER — ARIPIPRAZOLE 2 MG PO TABS
2.0000 mg | ORAL_TABLET | Freq: Every day | ORAL | 0 refills | Status: DC
Start: 2023-06-09 — End: 2023-07-19
  Filled 2023-06-09: qty 14, 14d supply, fill #0

## 2023-06-09 MED ORDER — PROPRANOLOL HCL 10 MG PO TABS
10.0000 mg | ORAL_TABLET | Freq: Two times a day (BID) | ORAL | 0 refills | Status: DC | PRN
Start: 2023-06-09 — End: 2023-09-27
  Filled 2023-06-09: qty 60, 30d supply, fill #0

## 2023-06-09 MED ORDER — TRAZODONE HCL 50 MG PO TABS
75.0000 mg | ORAL_TABLET | Freq: Every evening | ORAL | 0 refills | Status: DC | PRN
Start: 2023-06-09 — End: 2023-07-19
  Filled 2023-06-09: qty 30, 20d supply, fill #0

## 2023-06-09 NOTE — Progress Notes (Addendum)
BH MD Outpatient Progress Note  06/09/2023 3:14 PM Randy Braun  MRN:  [id number]  Assessment:  Randy Braun presents for follow-up evaluation in-person. Today, 06/09/23, patient reports anxiety related to starting new job at [employer]. Reports sleep disturbance that likely is in part due to adjustment to new job as well as cannabis use. Plan to continue taper off abilify and started propranolol to account for daytime anxiety. Can also consider starting SSRI to aid with anxiety as bupropion likely predominantly addressing MDD.  Identifying Information: Randy Braun is a 37 y.o. male with a history of MDD, anxiety, and insomnia who is an established patient with [facility] for medication management.   Plan:  # MDD, recurrent, moderate Past medication trials:  Status of problem: active Interventions: -- decrease Abilify to 2 mg daily for 2 weeks then discontinue -- continue bupropion XL 150 mg daily -- continue trazodone 50 mg at bedtime -- continue psychotherapy  # Cannabis Use Past medication trials:  Interventions: -- recommend cessation  # Generalized Anxiety Disorder Past medication trials:  Status of problem: active Interventions: -- START propranolol 10 mg bid prn   Return to care in 4 weeks  Patient was given contact information for behavioral health clinic and was instructed to call 911 for emergencies.    Patient and plan of care will be discussed with the Attending MD, Dr. Josephina Shih, who agrees with the above statement and plan.   Subjective:  Chief Complaint: Medication Management   Interval History:   Patient reports continued anxiety related to starting new job at [employer] 3 weeks ago. He reports he wants to keep up and ensure he is doing a good job. He states he will get up a few times in the night due to checking the time and his alarm as he needs to catch the bus and needs to wake up on time to ensure he makes it to his job. He  reports he also has regularly been waking up ~2:30, get out of bed for ~30 minutes before going back to bed to sleep. He reports restlessness due to his dream repeating itself and not completing which he reports might irritate him to the point he will wake up. He reports appetite is appropriate. He reports depression is well controlled with bupropion. He reports benefit from psychotherapy and is going out to play pool more to aid with socializing. He denies SI/HI/AVH. He does report occasional cannabis use which may be contributing to sleep disturbances but is to aid with his anxiety especially in evening.   Visit Diagnosis:    ICD-10-CM   1. MDD (major depressive disorder), recurrent episode, moderate (HCC)  F33.1 buPROPion (WELLBUTRIN XL) 150 MG 24 hr tablet    ARIPiprazole (ABILIFY) 2 MG tablet    propranolol (INDERAL) 10 MG tablet    traZODone (DESYREL) 50 MG tablet    2. Psychophysiological insomnia  F51.04 traZODone (DESYREL) 50 MG tablet    3. Cannabis use disorder  F12.90     4. Generalized anxiety disorder  F41.1       Past Psychiatric History:  Diagnoses: MDD, cannabis use disorder Medication trials: bupropion, abilify Previous psychiatrist/therapist: Dr. 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 Social History   Socioeconomic History   Marital status: Single    Spouse name: Not on file   Number of children: Not on file   Years of education: Not on file   Highest education level: Not on file  Occupational  History   Not on file  Tobacco Use   Smoking status: Every Day    Types: Cigarettes   Smokeless tobacco: Never   Tobacco comments:    Patient is now vaping (05/06/2022)  Vaping Use   Vaping status: Every Day   Substances: Nicotine, THC  Substance and Sexual Activity   Alcohol use: Yes    Comment: occasionally   Drug use: Yes    Types: Marijuana   Sexual activity: Not on file  Other Topics Concern   Not on file  Social History Narrative   Not on file   Social Determinants of Health   Financial Resource Strain: Medium Risk (04/28/2021)   Overall Financial Resource Strain (CARDIA)    Difficulty of Paying Living Expenses: Somewhat hard  Food Insecurity: No Food Insecurity (04/28/2021)   Hunger Vital Sign    Worried About Running Out of Food in the Last Year: Never true    Ran Out of Food in the Last Year: Never true  Transportation Needs: No Transportation Needs (04/28/2021)   PRAPARE - Administrator, Civil Service (Medical): No    Lack of Transportation (Non-Medical): No  Physical Activity: Inactive (04/28/2021)   Exercise Vital Sign    Days of Exercise per Week: 0 days    Minutes of Exercise per Session: 0 min  Stress: Stress Concern Present (04/28/2021)   [employer] of Occupational Health - Occupational Stress Questionnaire    Feeling of Stress : To some extent  Social Connections: Socially Isolated (04/28/2021)   Social Connection and Isolation Panel [NHANES]    Frequency of Communication with Friends and Family: More than three times a week    Frequency of Social Gatherings with Friends and Family: Once a week    Attends Religious Services: Never    Database administrator or Organizations: No    Attends Banker Meetings: Never    Marital Status: Never married    Allergies:  Allergies  Allergen Reactions   Vinegar [Acetic Acid] Other (See Comments)    Red Vinegar- flush    Current Medications: Current Outpatient Medications  Medication  Sig Dispense Refill   propranolol (INDERAL) 10 MG tablet Take 1 tablet (10 mg total) by mouth 2 (two) times daily as needed. 60 tablet 0   amoxicillin (AMOXIL) 500 MG capsule Take 1 capsule by mouth once every 8 hours until finished. (Patient not taking: Reported on 04/19/2023) 21 capsule 0   ARIPiprazole (ABILIFY) 2 MG tablet Take 1 tablet (2 mg total) by mouth daily for 14 days. 14 tablet 0   baclofen (LIORESAL) 10 MG tablet Take 0.5-1 tablets (5-10 mg total) by mouth 3 (three) times daily as needed for muscle spasms. (Patient not taking: Reported on 04/19/2023) 30 each 3   buPROPion (WELLBUTRIN XL) 150 MG 24 hr tablet Take 1 tablet (150 mg total) by mouth every morning. 30 tablet 2   chlorhexidine (PERIDEX) 0.12 % solution Swish for one minute and expectorate twice daily for 10 days. (  Patient not taking: Reported on 04/19/2023) 473 mL 0   diclofenac (VOLTAREN) 75 MG EC tablet Take 1 tablet (75 mg total) by mouth 2 (two) times daily as needed. (Patient not taking: Reported on 04/19/2023) 60 tablet 3   ibuprofen (ADVIL) 800 MG tablet Take 800 mg by mouth every 8 (eight) hours as needed.     ibuprofen (IBU) 800 MG tablet Take 1 tablet by mouth once every 8 hours as needed for pain. 20 tablet 1   meloxicam (MOBIC) 7.5 MG tablet Take 1 tablet (7.5 mg total) by mouth daily. 30 tablet 1   traZODone (DESYREL) 50 MG tablet Take 1.5 tablets (75 mg total) by mouth at bedtime as needed for sleep. 30 tablet 0   No current facility-administered medications for this visit.    ROS: Review of Systems   Objective:  Psychiatric Specialty Exam: There were no vitals taken for this visit.There is no height or weight on file to calculate BMI.  General Appearance: Fairly Groomed  Eye Contact: Good  Speech:  Clear and Coherent and Normal Rate  Volume:  Normal  Mood:  Euthymic  Affect:  Appropriate and Congruent  Thought Content: Logical   Suicidal Thoughts:  No  Homicidal Thoughts:  No  Thought Process:   Coherent, Goal Directed, and Linear  Orientation:  Full (Time, Place, and Person)    Memory: Remote;   Fair  Judgment:  Fair  Insight:  Fair  Concentration:  Concentration: Fair and Attention Span: Fair  Recall: not formally assessed   Fund of Knowledge: Fair  Language: Fair  Psychomotor Activity:  Normal  Akathisia:  Negative  AIMS (if indicated): not done  Assets:  Communication Skills Desire for Improvement Financial Resources/Insurance Housing Leisure Time Physical Health Resilience Social Support Talents/Skills Vocational/Educational  ADL's:  Intact  Cognition: WNL  Sleep:  Fair   PE: General: well-appearing; no acute distress  Pulm: no increased work of breathing on room air  Strength & Muscle Tone: within normal limits Neuro: no focal neurological deficits observed  Gait & Station: normal  Metabolic Disorder Labs: Lab Results  Component Value Date   HGBA1C 6.1 (H) 04/19/2023   No results found for: "PROLACTIN" Lab Results  Component Value Date   CHOL 148 04/19/2023   TRIG 53 04/19/2023   HDL 51 04/19/2023   CHOLHDL 2.9 04/19/2023   LDLCALC 86 04/19/2023   LDLCALC 106 (H) 12/14/2022   Lab Results  Component Value Date   TSH 1.400 04/19/2023   TSH 1.420 12/14/2022    Therapeutic Level Labs: No results found for: "LITHIUM" No results found for: "VALPROATE" No results found for: "CBMZ"  Screenings: GAD-7    Flowsheet Row Office Visit from 04/19/2023 in [facility] Counselor from 11/10/2022 in [facility] Counselor from 07/07/2022 in [facility] Video Visit from 03/04/2022 in [facility] Counselor from 02/16/2022 in [facility]  Total GAD-7 Score 3 1 9 5 3       PHQ2-9    Flowsheet Row Office Visit from 04/19/2023 in [facility] at [facility] Counselor from 11/10/2022 in [facility] Counselor from 07/07/2022 in [facility] Video Visit from 03/04/2022 in [facility] Counselor from 02/16/2022 in [facility]  PHQ-2 Total Score 1 0 2 0 0  PHQ-9 Total Score 4 1 4  -- 2  Flowsheet Row Video Visit from 03/04/2022 in [facility] Video Visit from 12/31/2021 in [facility] Video Visit from 11/05/2021 in [facility]  C-SSRS RISK CATEGORY Low Risk Low Risk Moderate Risk       Collaboration of Care: Collaboration of Care:  Patient/Guardian was advised Release of Information must be obtained prior to any record release in order to collaborate their care with an outside provider. Patient/Guardian was advised if they have not already done so to contact the registration department to sign all necessary forms in order for [place] to release information regarding their care.   Consent: Patient/Guardian gives verbal consent for treatment and assignment of benefits for services provided during this visit. Patient/Guardian expressed understanding and agreed to proceed.   Park Pope, MD 06/09/2023, 3:14 PM

## 2023-06-10 NOTE — Addendum Note (Signed)
Addended by: Theodoro Kos A on: 06/10/2023 03:40 PM   Modules accepted: Level of Service

## 2023-06-13 ENCOUNTER — Other Ambulatory Visit: Payer: Self-pay

## 2023-06-22 ENCOUNTER — Ambulatory Visit (HOSPITAL_COMMUNITY): Payer: No Payment, Other | Admitting: Licensed Clinical Social Worker

## 2023-06-22 DIAGNOSIS — F411 Generalized anxiety disorder: Secondary | ICD-10-CM | POA: Diagnosis not present

## 2023-06-22 DIAGNOSIS — F331 Major depressive disorder, recurrent, moderate: Secondary | ICD-10-CM

## 2023-06-22 NOTE — Progress Notes (Signed)
THERAPIST PROGRESS NOTE  Session Time: 50   Participation Level: Active  Behavioral Response: CasualAlertAnxious and Depressed  Type of Therapy: Individual Therapy  Treatment Goals addressed:  Active     Bipolar Disorder CCP Problem  1 bipolar disorder depressed      Decrease PHQ-9 below 10  (Progressing)     Start:  12/08/21    Expected End:  10/25/23         Decrease GAD-7 below 5  (Progressing)     Start:  12/08/21    Expected End:  10/25/23         LTG: Stabilize mood and increase goal-directed behavior: Using positive affirmation 1 x daily  (Progressing)     Start:  12/08/21    Expected End:  10/25/23         STG: Keilen WILL COOPERATE WITH A PSYCHIATRIC EVALUATION ON 04/28/21 AND DURING ALL FOLLOW-UP VISITS (Completed/Met)     Start:  12/08/21    Expected End:  05/21/22    Resolved:  12/30/21      STG: [id number] WILL IDENTIFY COGNITIVE PATTERNS AND BELIEFS THAT INTERFERE WITH THERAPY (Progressing)     Start:  12/08/21    Expected End:  10/25/23         STG: [id number] WILL ATTEND AT LEAST 80% OF SCHEDULED FOLLOW-UP MEDICATION MANAGEMENT APPOINTMENTS (Completed/Met)     Start:  12/08/21    Expected End:  11/26/22    Resolved:  09/07/22       Decrease marijuana use to 0/7 days per week  (Progressing)     Start:  06/02/22    Expected End:  10/25/23            WORK WITH Ameer TO DISCUSS RISKS AND BENEFITS OF MEDICATION TREATMENT OPTIONS FOR THIS PROBLEM AND PRESCRIBE AS INDICATED (Completed)     Start:  12/08/21    End:  02/16/22      Conduct a review of all medications to evaluate any drug/drug interactions (Completed)     Start:  12/08/21    End:  02/16/22      REVIEW WITH Dahl THEIR RESPONSE TO THE PRESCRIBED MEDICATION, INCLUDING ANY SIDE EFFECTS (Completed)     Start:  12/08/21    End:  02/16/22      ENCOURAGE Kenzie TO KEEP A LOG OF MEDICATION SIDE EFFECTS, QUESTIONS REGARDING MEDICATION, AND SYMPTOMS (Completed)     Start:   12/08/21    End:  02/16/22      INSTRUCT Avantae TO TAKE PSYCHOTROPIC MEDICATION AS PRESCRIBED (Completed)     Start:  12/08/21    End:  02/16/22      INSTRUCT Dontaye TO COMMUNICATE EFFECTS OF PRESCRIBED MEDICATIONS (Completed)     Start:  12/08/21    End:  02/16/22      EDUCATE Torell ON BIPOLAR DISORDER DIAGNOSTIC CRITERIA (Completed)     Start:  12/08/21    End:  02/16/22         ProgressTowards Goals: Progressing  Interventions: CBT and Motivational Interviewing  Summary: Keymarion Mumma is a 37 y.o. male who presents with depressed and anxious mood\affect.  Patient was pleasant, cooperative, maintained good eye contact.  He engaged well in therapy session and was dressed casually. Diquan was alert and oriented x 5.  Patient reports primary stressors as financials and work.  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  Patient reports that there is lots of things to learn such as pricing for  jewelry and guns and following through with regulations on those items when they are blocked or pond in the store.   Patient also reports excessive spending as evidenced by putting a Nintendo switch onlay away.  Patient showed frustration when LCSW spoke with him about the frustration of the purchase of the item because patient stated "I do not know if I should have bought that".  LCSW asked patient if in the future he should spend things on items that he had to put on lay away and if that was causing him stress?  And this is when patient started to get irritable stating "I do not see it that way".  LCSW did point out to patient that he was radiating up in session and had shown stress towards the purchase which was the reasoning for LCSW's comments.  Patient was receptive to comments and understood LCSW's challenging statement.  Suicidal/Homicidal: Nowithout intent/plan  Therapist Response:       Intervention/Plan: LCSW supportive therapy for praise and  encouragement.  LCSW use cycle analytic therapy for patient to express thoughts, feelings and emotions.  LCSW educated patient on needs versus wants as far as spending was concerned.  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  LCSW use person centered therapy for empowerment.  Plan: Return again in 3 weeks.  Diagnosis: Generalized anxiety disorder  Major depressive disorder, recurrent episode, moderate (HCC)  Collaboration of Care: Other None today   Patient/Guardian was advised Release of Information must be obtained prior to any record release in order to collaborate their care with an outside provider. Patient/Guardian was advised if they have not already done so to contact the registration department to sign all necessary forms in order for [place] to release information regarding their care.   Consent: Patient/Guardian gives verbal consent for treatment and assignment of benefits for services provided during this visit. Patient/Guardian expressed understanding and agreed to proceed.   Weber Cooks, LCSW 06/22/2023

## 2023-07-05 ENCOUNTER — Ambulatory Visit (INDEPENDENT_AMBULATORY_CARE_PROVIDER_SITE_OTHER): Payer: No Payment, Other | Admitting: Licensed Clinical Social Worker

## 2023-07-05 DIAGNOSIS — F331 Major depressive disorder, recurrent, moderate: Secondary | ICD-10-CM

## 2023-07-05 DIAGNOSIS — F411 Generalized anxiety disorder: Secondary | ICD-10-CM

## 2023-07-05 NOTE — Progress Notes (Signed)
THERAPIST PROGRESS NOTE  Session Time: 46  Participation Level: Active  Behavioral Response: CasualAlertAnxious and Depressed  Type of Therapy: Individual Therapy  Treatment Goals addressed:  Active     Bipolar Disorder CCP Problem  1 bipolar disorder depressed      Decrease PHQ-9 below 10  (Progressing)     Start:  12/08/21    Expected End:  10/25/23         Decrease GAD-7 below 5  (Progressing)     Start:  12/08/21    Expected End:  10/25/23         LTG: Stabilize mood and increase goal-directed behavior: Using positive affirmation 1 x daily  (Progressing)     Start:  12/08/21    Expected End:  10/25/23         STG: Tamarius WILL COOPERATE WITH A PSYCHIATRIC EVALUATION ON 04/28/21 AND DURING ALL FOLLOW-UP VISITS (Completed/Met)     Start:  12/08/21    Expected End:  05/21/22    Resolved:  12/30/21      STG: [name] WILL IDENTIFY COGNITIVE PATTERNS AND BELIEFS THAT INTERFERE WITH THERAPY (Progressing)     Start:  12/08/21    Expected End:  10/25/23         STG: [id number] WILL ATTEND AT LEAST 80% OF SCHEDULED FOLLOW-UP MEDICATION MANAGEMENT APPOINTMENTS (Completed/Met)     Start:  12/08/21    Expected End:  11/26/22    Resolved:  09/07/22       Decrease marijuana use to 0/7 days per week  (Progressing)     Start:  06/02/22    Expected End:  10/25/23            WORK WITH Abdul TO DISCUSS RISKS AND BENEFITS OF MEDICATION TREATMENT OPTIONS FOR THIS PROBLEM AND PRESCRIBE AS INDICATED (Completed)     Start:  12/08/21    End:  02/16/22      Conduct a review of all medications to evaluate any drug/drug interactions (Completed)     Start:  12/08/21    End:  02/16/22      REVIEW WITH Suresh THEIR RESPONSE TO THE PRESCRIBED MEDICATION, INCLUDING ANY SIDE EFFECTS (Completed)     Start:  12/08/21    End:  02/16/22      ENCOURAGE Derion TO KEEP A LOG OF MEDICATION SIDE EFFECTS, QUESTIONS REGARDING MEDICATION, AND SYMPTOMS (Completed)     Start:   12/08/21    End:  02/16/22      INSTRUCT Vanden TO TAKE PSYCHOTROPIC MEDICATION AS PRESCRIBED (Completed)     Start:  12/08/21    End:  02/16/22      INSTRUCT Klyde TO COMMUNICATE EFFECTS OF PRESCRIBED MEDICATIONS (Completed)     Start:  12/08/21    End:  02/16/22      EDUCATE Jasmin ON BIPOLAR DISORDER DIAGNOSTIC CRITERIA (Completed)     Start:  12/08/21    End:  02/16/22         ProgressTowards Goals: Progressing  Interventions: CBT, Motivational Interviewing, and Reframing  Summary: Elonzo Villar is a 37 y.o. male who presents with depressed and anxious mood\affect.  Patient was pleasant, cooperative, maintained good eye contact.  He was alert and oriented x 5.  Patient comes in today with primary stressors as work.  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  Patient reports that he is starting to train on these items and is hopeful for increased commission due to the value of those items.  Patient reports that most of his time outside of work is spent playing video games.  Patient reports that other friends that he has are more engaged with their families and work patient would like an increase in interaction and socialization and also hobbies.  Suicidal/Homicidal: Nowithout intent/plan  Therapist Response:    Interventions/Plan: LCSW psycho analytic therapy for patient to express thoughts, feelings and emotions in session utilizing free association.  LCSW supportive therapy for praise and encouragement.  LCSW used motivational interviewing for open-ended questions and reflective listening.  LCSW educated patient on benefits of hobbies outside of video games such as going for walks, working out, and joining clubs or organizations.  LCSW educated patient on tracking symptoms as he has  stopped one of his mood stabilizers per Dr.'s request. Plan: Return again in 4 weeks.  Diagnosis: Major depressive disorder, recurrent episode, moderate (HCC)  Generalized anxiety disorder  Collaboration of Care: Other None today   Patient/Guardian was advised Release of Information must be obtained prior to any record release in order to collaborate their care with an outside provider. Patient/Guardian was advised if they have not already done so to contact the registration department to sign all necessary forms in order for [place] to release information regarding their care.   Consent: Patient/Guardian gives verbal consent for treatment and assignment of benefits for services provided during this visit. Patient/Guardian expressed understanding and agreed to proceed.   Weber Cooks, LCSW 07/05/2023

## 2023-07-13 ENCOUNTER — Other Ambulatory Visit: Payer: Self-pay

## 2023-07-17 NOTE — Progress Notes (Addendum)
BH MD Outpatient Progress Note  07/19/2023 3:08 PM Randy Braun  MRN:  [id number]  Assessment:  Randy Braun presents for follow-up evaluation in-person. Today, 07/19/23, patient reports financial stress and back pain that has worsened recent mood but reports it is manageable. He would like to continue medications at present doses and follow up in ~1 month.   Identifying Information: Randy Braun is a 37 y.o. male with a history of MDD, anxiety, and insomnia who is an established patient with [facility] for medication management.   Plan:  # MDD, recurrent, moderate Past medication trials: Randy Braun Status of problem: active Interventions: -- continue bupropion Braun 150 mg daily -- continue trazodone 50 mg at bedtime -- continue psychotherapy  # Cannabis Use Past medication trials:  Interventions: -- recommend cessation  # Generalized Anxiety Disorder Past medication trials:  Status of problem: active Interventions: -- START propranolol 10 mg bid prn   Return to care in 4 weeks  Patient was given contact information for behavioral health clinic and was instructed to call 911 for emergencies.    Patient and plan of care will be discussed with the Attending MD, Dr. Josephina Braun, who agrees with the above statement and plan.   Subjective:  Chief Complaint: Medication Management   Interval History:  Patient reports significant financial stress related to his current job. He reports making less money but also lost food stamps and is paying more for rent still. He also reports back pain related to scoliosis. Denies SI/HI/AVH. 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 He would like to continue the medication regiment at current dose. He states he is feeling mildly more depressed and difficulty with motivation getting out of bed. He is sleeping ~6-7 hours per night and eating well (although buying food is  a stress due to financial stress).   Visit Diagnosis:    ICD-10-CM   1. MDD (major depressive disorder), recurrent episode, moderate (HCC)  F33.1 buPROPion (Randy Braun) 150 MG 24 hr tablet    traZODone (Randy Braun) 50 MG tablet    2. Psychophysiological insomnia  F51.04 traZODone (Randy Braun) 50 MG tablet       Past Psychiatric History:  Diagnoses: MDD, cannabis use disorder Medication trials: bupropion, Randy Braun Previous psychiatrist/therapist: Dr. 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 Social History   Socioeconomic History   Marital status: Single    Spouse name: Not on file   Number of children: Not on file   Years of education: Not on file   Highest education level: Not on file  Occupational History   Not on file  Tobacco Use   Smoking status: Every Day    Types: Cigarettes   Smokeless tobacco: Never   Tobacco comments:    Patient is now vaping (05/06/2022)  Vaping Use   Vaping status: Every Day   Substances: Nicotine, THC  Substance and Sexual Activity   Alcohol use: Yes    Comment: occasionally   Drug  use: Yes    Types: Marijuana   Sexual activity: Not on file  Other Topics Concern   Not on file  Social History Narrative   Not on file   Social Determinants of Health   Financial Resource Strain: Medium Risk (04/28/2021)   Overall Financial Resource Strain (CARDIA)    Difficulty of Paying Living Expenses: Somewhat hard  Food Insecurity: No Food Insecurity (04/28/2021)   Hunger Vital Sign     Worried About Running Out of Food in the Last Year: Never true    Ran Out of Food in the Last Year: Never true  Transportation Needs: No Transportation Needs (04/28/2021)   PRAPARE - Administrator, Civil Service (Medical): No    Lack of Transportation (Non-Medical): No  Physical Activity: Inactive (04/28/2021)   Exercise Vital Sign    Days of Exercise per Week: 0 days    Minutes of Exercise per Session: 0 min  Stress: Stress Concern Present (04/28/2021)   Randy Braun    Feeling of Stress : To some extent  Social Connections: Socially Isolated (04/28/2021)   Social Connection and Isolation Panel [NHANES]    Frequency of Communication with Friends and Family: More than three times a week    Frequency of Social Gatherings with Friends and Family: Once a week    Attends Religious Services: Never    Database administrator or Organizations: No    Attends Banker Meetings: Never    Marital Status: Never married    Allergies:  Allergies  Allergen Reactions   Vinegar [Acetic Acid] Other (See Comments)    Red Vinegar- flush    Current Medications: Current Outpatient Medications  Medication Sig Dispense Refill   buPROPion (Randy Braun) 150 MG 24 hr tablet Take 1 tablet (150 mg total) by mouth every morning. 30 tablet 2   ibuprofen (ADVIL) 800 MG tablet Take 800 mg by mouth every 8 (eight) hours as needed.     ibuprofen (IBU) 800 MG tablet Take 1 tablet by mouth once every 8 hours as needed for pain. 20 tablet 1   meloxicam (MOBIC) 7.5 MG tablet Take 1 tablet (7.5 mg total) by mouth daily. 30 tablet 1   propranolol (INDERAL) 10 MG tablet Take 1 tablet (10 mg total) by mouth 2 (two) times daily as needed. 60 tablet 0   traZODone (Randy Braun) 50 MG tablet Take 1 tablet (50 mg total) by mouth at bedtime as needed for sleep. 30 tablet 0   No current facility-administered medications for this visit.     ROS: Review of Systems   Objective:  Psychiatric Specialty Exam: There were no vitals taken for this visit.There is no height or weight on file to calculate BMI.  General Appearance: Fairly Groomed  Eye Contact: Good  Speech:  Clear and Coherent and Normal Rate  Volume:  Normal  Mood:  Euthymic  Affect:  Appropriate and Congruent  Thought Content: Logical   Suicidal Thoughts:  No  Homicidal Thoughts:  No  Thought Process:  Coherent, Goal Directed, and Linear  Orientation:  Full (Time, Place, and Person)    Memory: Remote;   Fair  Judgment:  Fair  Insight:  Fair  Concentration:  Concentration: Fair and Attention Span: Fair  Recall: not formally assessed   Fund of Knowledge: Fair  Language: Fair  Psychomotor Activity:  Normal  Akathisia:  Negative  AIMS (if indicated): not done  Assets:  Manufacturing systems engineer  Desire for Improvement Financial Braun/Insurance Housing Leisure Time Physical Health Resilience Social Support Talents/Skills Vocational/Educational  ADL's:  Intact  Cognition: WNL  Sleep:  Fair   PE: General: well-appearing; no acute distress  Pulm: no increased work of breathing on room air  Strength & Muscle Tone: within normal limits Neuro: no focal neurological deficits observed  Gait & Station: normal  Metabolic Disorder Labs: Lab Results  Component Value Date   HGBA1C 6.1 (H) 04/19/2023   No results found for: "PROLACTIN" Lab Results  Component Value Date   CHOL 148 04/19/2023   TRIG 53 04/19/2023   HDL 51 04/19/2023   CHOLHDL 2.9 04/19/2023   LDLCALC 86 04/19/2023   LDLCALC 106 (H) 12/14/2022   Lab Results  Component Value Date   TSH 1.400 04/19/2023   TSH 1.420 12/14/2022    Therapeutic Level Labs: No results found for: "LITHIUM" No results found for: "VALPROATE" No results found for: "CBMZ"  Screenings: GAD-7    Flowsheet Row Office Visit from 04/19/2023 in [facility] Counselor from  11/10/2022 in [facility] Counselor from 07/07/2022 in [facility] Video Visit from 03/04/2022 in [facility] Counselor from 02/16/2022 in [facility]  Total GAD-7 Score 3 1 9 5 3       PHQ2-9    Flowsheet Row Office Visit from 04/19/2023 in [facility] Counselor from 11/10/2022 in [facility] Counselor from 07/07/2022 in [facility] Video Visit from 03/04/2022 in [facility] Counselor from 02/16/2022 in [facility]  PHQ-2 Total Score 1 0 2 0 0  PHQ-9 Total Score 4 1 4  -- 2      Flowsheet Row Video Visit from 03/04/2022 in [facility] Video Visit from 12/31/2021 in [facility] Video Visit from 11/05/2021 in [facility]  C-SSRS RISK CATEGORY Low Risk Low Risk Moderate Risk       Collaboration of Care: Collaboration of Care:  Patient/Guardian was advised Release of Information must be obtained prior to any record release in order to collaborate their care with an outside provider. Patient/Guardian was advised if they have not already done so to contact the registration department to sign all necessary forms in order for [place] to release information regarding their care.   Consent: Patient/Guardian gives verbal consent for treatment and assignment of benefits for services provided during this visit. Patient/Guardian expressed understanding and agreed to proceed.   Park Pope, MD 07/19/2023, 3:08 PM

## 2023-07-19 ENCOUNTER — Other Ambulatory Visit: Payer: Self-pay

## 2023-07-19 ENCOUNTER — Ambulatory Visit (INDEPENDENT_AMBULATORY_CARE_PROVIDER_SITE_OTHER): Payer: No Payment, Other | Admitting: Student

## 2023-07-19 DIAGNOSIS — F331 Major depressive disorder, recurrent, moderate: Secondary | ICD-10-CM | POA: Diagnosis not present

## 2023-07-19 DIAGNOSIS — F5104 Psychophysiologic insomnia: Secondary | ICD-10-CM | POA: Diagnosis not present

## 2023-07-19 MED ORDER — BUPROPION HCL ER (XL) 150 MG PO TB24
150.0000 mg | ORAL_TABLET | ORAL | 2 refills | Status: DC
Start: 2023-07-19 — End: 2023-08-16
  Filled 2023-07-19 – 2023-08-09 (×2): qty 30, 30d supply, fill #0

## 2023-07-19 MED ORDER — TRAZODONE HCL 50 MG PO TABS
50.0000 mg | ORAL_TABLET | Freq: Every evening | ORAL | 0 refills | Status: DC | PRN
Start: 2023-07-19 — End: 2023-08-16
  Filled 2023-07-19: qty 30, 30d supply, fill #0

## 2023-07-25 ENCOUNTER — Other Ambulatory Visit: Payer: Self-pay

## 2023-07-26 ENCOUNTER — Other Ambulatory Visit: Payer: Self-pay

## 2023-08-02 ENCOUNTER — Ambulatory Visit (HOSPITAL_COMMUNITY): Payer: No Payment, Other | Admitting: Licensed Clinical Social Worker

## 2023-08-09 ENCOUNTER — Other Ambulatory Visit: Payer: Self-pay

## 2023-08-15 NOTE — Progress Notes (Unsigned)
BH MD Outpatient Progress Note  08/16/2023 3:10 PM Randy Braun  MRN:  [id number]  Assessment:  Randy Braun presents for follow-up evaluation in-person. 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  Today, 08/16/23, patient reports continued financial stress and back pain that has worsened recent mood but continues to be uninterested in medication adjustment. We discussed a variety of reasons for patient's worsening anxiety including his psychosocial stressors but also his continued CBD/THC use and lack of social support .  Based on exacerbation of his GAD, I advised that we start a SSRI to aid with his symptoms but he would like to he would like to continue medications at present doses and follow up in ~1 month.  We will continue this discussion and should his mental health symptoms worsen next month, I would strongly encourage him to start a medication to better address his baseline anxiety/depression.  Identifying Information: Randy Braun is a 37 y.o. male with a history of MDD, anxiety, and insomnia who is an established patient with [facility] for medication management.   Plan:  # MDD, recurrent, moderate Past medication trials: Abilify Status of problem: active Interventions: -- continue bupropion XL 150 mg daily -- continue trazodone 50 mg at bedtime -- continue psychotherapy  # Cannabis Use Past medication trials:  Interventions: -- recommend cessation  # Generalized Anxiety Disorder Past medication trials:  Status of problem: active Interventions: -- START propranolol 10 mg bid prn   Return to care in 4 weeks  Patient was given contact information for behavioral health clinic and was instructed to call 911 for emergencies.    Patient and plan of care will be discussed with the Attending MD, Dr. Josephina Shih, who agrees  with the above statement and plan.   Subjective:  Chief Complaint: Medication Management   Interval History:  Patient reports significant financial stress related to his current job. He reports he is re-applying for food stamps today and that is rent was finally lowered this month. He reports continued back pain related to scoliosis and wants to get a second opinion. Denies SI/HI/AVH.  He was able to obtain Medicaid since last visit which I discussed him talking with his Medicaid caseworker in order to maximize his use of Medicaid. He would like to continue the medication regiment at current dose.  He states he feels more stressed than last time especially because he continues to struggle with working more but being paid less at [employer] as well as not feeling that he is able to maximize his available resources in order to support himself. He continues to sleep ~6-7 hours per night and eating well (although buying food is a stress due to financial stress).  Another stressor for him is that his CBD devices have been problematic to him and he has been needing to buy extra devices as they do not work as they should.  He does state that CBD/THC has helped keep "the monkey off my back".  He does not feel he is self-medicating with CBD/THC but also admits that he has difficulty discontinuing THC/CBD use.  He states that he had been off cannabis for approximately 1 month but he felt more stressed and irritable off of THC/CBD.  He understands that this may affect his mood and blunted effects of his present psychotropics but is currently precontemplative regarding stopping use.  He reports occasionally  using propranolol for social anxiety and has somewhat helped.  Visit Diagnosis:    ICD-10-CM   1. MDD (major depressive disorder), recurrent episode, moderate (HCC)  F33.1 buPROPion (WELLBUTRIN XL) 150 MG 24 hr tablet    traZODone (DESYREL) 50 MG tablet    2. Psychophysiological insomnia  F51.04 traZODone  (DESYREL) 50 MG tablet        Past Psychiatric History:  Diagnoses: MDD, cannabis use disorder Medication trials: bupropion, abilify Previous psychiatrist/therapist: Dr. 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 Social History   Socioeconomic History   Marital status: Single    Spouse name: Not on file   Number of children: Not on file   Years of education: Not on file   Highest education level: Not on file  Occupational History   Not on file  Tobacco Use   Smoking status: Every Day    Types: Cigarettes   Smokeless tobacco: Never   Tobacco comments:    Patient is now vaping (05/06/2022)  Vaping Use   Vaping status: Every Day   Substances: Nicotine, THC  Substance and Sexual Activity   Alcohol use: Yes    Comment: occasionally   Drug use: Yes    Types: Marijuana   Sexual activity: Not on file  Other Topics Concern   Not on file  Social History Narrative   Not on file   Social Determinants of Health   Financial Resource Strain: Medium Risk (04/28/2021)   Overall Financial Resource Strain (CARDIA)    Difficulty of Paying Living Expenses: Somewhat hard  Food Insecurity: No Food Insecurity (04/28/2021)   Hunger Vital Sign    Worried About Running Out of Food in the Last Year: Never true    Ran Out of Food in the Last Year: Never true  Transportation Needs: No Transportation Needs (04/28/2021)   PRAPARE - Administrator, Civil Service (Medical): No    Lack of Transportation (Non-Medical): No   Physical Activity: Inactive (04/28/2021)   Exercise Vital Sign    Days of Exercise per Week: 0 days    Minutes of Exercise per Session: 0 min  Stress: Stress Concern Present (04/28/2021)   [employer] of Occupational Health - Occupational Stress Questionnaire    Feeling of Stress : To some extent  Social Connections: Socially Isolated (04/28/2021)   Social Connection and Isolation Panel [NHANES]    Frequency of Communication with Friends and Family: More than three times a week    Frequency of Social Gatherings with Friends and Family: Once a week    Attends Religious Services: Never    Database administrator or Organizations: No    Attends Banker Meetings: Never    Marital Status: Never married    Allergies:  Allergies  Allergen Reactions   Vinegar [Acetic Acid] Other (See Comments)    Red Vinegar- flush    Current Medications: Current Outpatient Medications  Medication Sig Dispense Refill   [START ON 09/09/2023] buPROPion (WELLBUTRIN XL) 150 MG 24 hr  tablet Take 1 tablet (150 mg total) by mouth every morning. 30 tablet 1   ibuprofen (ADVIL) 800 MG tablet Take 800 mg by mouth every 8 (eight) hours as needed.     ibuprofen (IBU) 800 MG tablet Take 1 tablet by mouth once every 8 hours as needed for pain. 20 tablet 1   meloxicam (MOBIC) 7.5 MG tablet Take 1 tablet (7.5 mg total) by mouth daily. 30 tablet 1   propranolol (INDERAL) 10 MG tablet Take 1 tablet (10 mg total) by mouth 2 (two) times daily as needed. 60 tablet 0   traZODone (DESYREL) 50 MG tablet Take 1 tablet (50 mg total) by mouth at bedtime as needed for sleep. 30 tablet 1   No current facility-administered medications for this visit.    ROS: Review of Systems   Objective:  Psychiatric Specialty Exam: There were no vitals taken for this visit.There is no height or weight on file to calculate BMI.  General Appearance: Fairly Groomed  Eye Contact: Good  Speech:  Clear and Coherent and Normal Rate   Volume:  Normal  Mood:  Anxious and Depressed  Affect:  Congruent and Depressed  Thought Content: Logical   Suicidal Thoughts:  No  Homicidal Thoughts:  No  Thought Process:  Coherent, Goal Directed, and Linear  Orientation:  Full (Time, Place, and Person)    Memory: Remote;   Fair  Judgment:  Fair  Insight:  Fair  Concentration:  Concentration: Fair and Attention Span: Fair  Recall: not formally assessed   Fund of Knowledge: Fair  Language: Fair  Psychomotor Activity:  Normal  Akathisia:  Negative  AIMS (if indicated): not done  Assets:  Communication Skills Desire for Improvement Financial Resources/Insurance Housing Leisure Time Physical Health Resilience Social Support Talents/Skills Vocational/Educational  ADL's:  Intact  Cognition: WNL  Sleep:  Fair   PE: General: well-appearing; no acute distress  Pulm: no increased work of breathing on room air  Strength & Muscle Tone: within normal limits Neuro: no focal neurological deficits observed  Gait & Station: normal  Metabolic Disorder Labs: Lab Results  Component Value Date   HGBA1C 6.1 (H) 04/19/2023   No results found for: "PROLACTIN" Lab Results  Component Value Date   CHOL 148 04/19/2023   TRIG 53 04/19/2023   HDL 51 04/19/2023   CHOLHDL 2.9 04/19/2023   LDLCALC 86 04/19/2023   LDLCALC 106 (H) 12/14/2022   Lab Results  Component Value Date   TSH 1.400 04/19/2023   TSH 1.420 12/14/2022    Therapeutic Level Labs: No results found for: "LITHIUM" No results found for: "VALPROATE" No results found for: "CBMZ"  Screenings: GAD-7    Flowsheet Row Office Visit from 04/19/2023 in [facility] Counselor from 11/10/2022 in [facility] Counselor from 07/07/2022 in [facility] Video Visit from 03/04/2022 in [facility] Counselor from 02/16/2022 in [facility]   Total GAD-7 Score 3 1 9 5 3       PHQ2-9    Flowsheet Row Office Visit from 04/19/2023 in [facility] at [facility] Counselor from 11/10/2022 in [facility] Counselor from 07/07/2022 in [facility] Video Visit from 03/04/2022 in [facility] Counselor from 02/16/2022 in [facility]  PHQ-2 Total Score 1 0 2 0 0  PHQ-9 Total Score 4 1 4  -- 2  Flowsheet Row Video Visit from 03/04/2022 in [facility] Video Visit from 12/31/2021 in [facility] Video Visit from 11/05/2021 in [facility]  C-SSRS RISK CATEGORY Low Risk Low Risk Moderate Risk       Collaboration of Care: Collaboration of Care:  Patient/Guardian was advised Release of Information must be obtained prior to any record release in order to collaborate their care with an outside provider. Patient/Guardian was advised if they have not already done so to contact the registration department to sign all necessary forms in order for [place] to release information regarding their care.   Consent: Patient/Guardian gives verbal consent for treatment and assignment of benefits for services provided during this visit. Patient/Guardian expressed understanding and agreed to proceed.   Park Pope, MD 08/16/2023, 3:10 PM

## 2023-08-16 ENCOUNTER — Ambulatory Visit (INDEPENDENT_AMBULATORY_CARE_PROVIDER_SITE_OTHER): Payer: No Payment, Other | Admitting: Student

## 2023-08-16 ENCOUNTER — Other Ambulatory Visit: Payer: Self-pay

## 2023-08-16 DIAGNOSIS — F331 Major depressive disorder, recurrent, moderate: Secondary | ICD-10-CM | POA: Diagnosis not present

## 2023-08-16 DIAGNOSIS — F5104 Psychophysiologic insomnia: Secondary | ICD-10-CM | POA: Diagnosis not present

## 2023-08-16 MED ORDER — TRAZODONE HCL 50 MG PO TABS
50.0000 mg | ORAL_TABLET | Freq: Every evening | ORAL | 1 refills | Status: DC | PRN
Start: 2023-08-16 — End: 2023-09-27
  Filled 2023-08-16 – 2023-08-24 (×2): qty 30, 30d supply, fill #0

## 2023-08-16 MED ORDER — BUPROPION HCL ER (XL) 150 MG PO TB24
150.0000 mg | ORAL_TABLET | ORAL | 1 refills | Status: DC
Start: 2023-09-09 — End: 2023-09-27
  Filled 2023-08-16 – 2023-09-14 (×2): qty 30, 30d supply, fill #0

## 2023-08-17 NOTE — Addendum Note (Signed)
Addended by: Everlena Cooper on: 08/17/2023 09:14 AM   Modules accepted: Level of Service

## 2023-08-23 ENCOUNTER — Ambulatory Visit (INDEPENDENT_AMBULATORY_CARE_PROVIDER_SITE_OTHER): Payer: No Payment, Other | Admitting: Licensed Clinical Social Worker

## 2023-08-23 DIAGNOSIS — F331 Major depressive disorder, recurrent, moderate: Secondary | ICD-10-CM

## 2023-08-23 DIAGNOSIS — F411 Generalized anxiety disorder: Secondary | ICD-10-CM | POA: Diagnosis not present

## 2023-08-23 NOTE — Progress Notes (Signed)
THERAPIST PROGRESS NOTE  Session Time: 27   Participation Level: Active  Behavioral Response: CasualAlertAnxious and Depressed  Type of Therapy: Individual Therapy  Treatment Goals addressed:  Active     Bipolar Disorder CCP Problem  1 bipolar disorder depressed      Decrease PHQ-9 below 10  (Progressing)     Start:  12/08/21    Expected End:  10/25/23         Decrease GAD-7 below 5  (Progressing)     Start:  12/08/21    Expected End:  10/25/23         LTG: Stabilize mood and increase goal-directed behavior: Using positive affirmation 1 x daily  (Progressing)     Start:  12/08/21    Expected End:  10/25/23         STG: Dagen WILL COOPERATE WITH A PSYCHIATRIC EVALUATION ON 04/28/21 AND DURING ALL FOLLOW-UP VISITS (Completed/Met)     Start:  12/08/21    Expected End:  05/21/22    Resolved:  12/30/21      STG: [id number] WILL IDENTIFY COGNITIVE PATTERNS AND BELIEFS THAT INTERFERE WITH THERAPY (Progressing)     Start:  12/08/21    Expected End:  10/25/23         STG: [id number] WILL ATTEND AT LEAST 80% OF SCHEDULED FOLLOW-UP MEDICATION MANAGEMENT APPOINTMENTS (Completed/Met)     Start:  12/08/21    Expected End:  11/26/22    Resolved:  09/07/22       Decrease marijuana use to 0/7 days per week  (Progressing)     Start:  06/02/22    Expected End:  10/25/23            WORK WITH Deandrae TO DISCUSS RISKS AND BENEFITS OF MEDICATION TREATMENT OPTIONS FOR THIS PROBLEM AND PRESCRIBE AS INDICATED (Completed)     Start:  12/08/21    End:  02/16/22      Conduct a review of all medications to evaluate any drug/drug interactions (Completed)     Start:  12/08/21    End:  02/16/22      REVIEW WITH Wayde THEIR RESPONSE TO THE PRESCRIBED MEDICATION, INCLUDING ANY SIDE EFFECTS (Completed)     Start:  12/08/21    End:  02/16/22      ENCOURAGE Yazir TO KEEP A LOG OF MEDICATION SIDE EFFECTS, QUESTIONS REGARDING MEDICATION, AND SYMPTOMS (Completed)     Start:   12/08/21    End:  02/16/22      INSTRUCT Alassane TO TAKE PSYCHOTROPIC MEDICATION AS PRESCRIBED (Completed)     Start:  12/08/21    End:  02/16/22      INSTRUCT Zayquan TO COMMUNICATE EFFECTS OF PRESCRIBED MEDICATIONS (Completed)     Start:  12/08/21    End:  02/16/22      EDUCATE Murphy ON BIPOLAR DISORDER DIAGNOSTIC CRITERIA (Completed)     Start:  12/08/21    End:  02/16/22         ProgressTowards Goals: Progressing  Interventions: CBT, Motivational Interviewing, and Supportive  Summary: Randy Braun is a 37 y.o. male who presents with depressed and anxious mood\affect.  Patient was pleasant, cooperative, and maintained good eye contact.  He was alert and oriented x 5.  Patient comes in today with primary stressors as work and where he is in life.  Patient reports that he does not give himself credit for the positive things that he does in his life.  Patient reports that he focuses on the negatives.  Patient reports that he is having trouble setting boundaries in his personal life, and at work.  Patient reports irritability, tension, worry at work due to his [employer] either coming up under a dollars short or over $1.  Patient reports that he feels like there will be consequences if this continues to happen.  Suicidal/Homicidal: Nowithout intent/plan  Therapist Response:     Intervention/Plan: LCSW provided patient with worksheet for "setting healthy boundaries" and "creating worklife boundaries".  LCSW discussed cognitive distortions for "should statements" and "dismissing the positive".  LCSW psycho analytic therapy for patient to express thoughts, feelings and emotions in session.  LCSW reviewed coping skills for deep breathing, progressive muscle relaxation, imagery, and challenging irrational thoughts.  LCSW supportive therapy for praise and encouragement.  Plan: Return again in 6 weeks.  Diagnosis: Generalized anxiety disorder  Major depressive disorder, recurrent  episode, moderate (HCC)  Collaboration of Care: Other None today   Patient/Guardian was advised Release of Information must be obtained prior to any record release in order to collaborate their care with an outside provider. Patient/Guardian was advised if they have not already done so to contact the registration department to sign all necessary forms in order for [place] to release information regarding their care.   Consent: Patient/Guardian gives verbal consent for treatment and assignment of benefits for services provided during this visit. Patient/Guardian expressed understanding and agreed to proceed.   Weber Cooks, LCSW 08/23/2023

## 2023-08-24 ENCOUNTER — Other Ambulatory Visit: Payer: Self-pay

## 2023-08-31 ENCOUNTER — Other Ambulatory Visit: Payer: Self-pay

## 2023-08-31 MED ORDER — PENICILLIN V POTASSIUM 500 MG PO TABS
500.0000 mg | ORAL_TABLET | Freq: Four times a day (QID) | ORAL | 0 refills | Status: DC
  Filled 2023-08-31: qty 24, 6d supply, fill #0

## 2023-08-31 MED ORDER — TRAMADOL HCL 50 MG PO TABS
50.0000 mg | ORAL_TABLET | Freq: Four times a day (QID) | ORAL | 0 refills | Status: DC | PRN
  Filled 2023-08-31: qty 10, 3d supply, fill #0

## 2023-09-01 ENCOUNTER — Other Ambulatory Visit: Payer: Self-pay

## 2023-09-14 ENCOUNTER — Other Ambulatory Visit: Payer: Self-pay

## 2023-09-27 ENCOUNTER — Other Ambulatory Visit: Payer: Self-pay

## 2023-09-27 ENCOUNTER — Ambulatory Visit (INDEPENDENT_AMBULATORY_CARE_PROVIDER_SITE_OTHER): Payer: No Payment, Other | Admitting: Student

## 2023-09-27 DIAGNOSIS — F5104 Psychophysiologic insomnia: Secondary | ICD-10-CM

## 2023-09-27 DIAGNOSIS — F331 Major depressive disorder, recurrent, moderate: Secondary | ICD-10-CM

## 2023-09-27 MED ORDER — BUPROPION HCL ER (XL) 150 MG PO TB24
150.0000 mg | ORAL_TABLET | ORAL | 0 refills | Status: DC
Start: 2023-09-27 — End: 2023-12-08
  Filled 2023-09-27 – 2023-10-12 (×2): qty 90, 90d supply, fill #0

## 2023-09-27 MED ORDER — PROPRANOLOL HCL 10 MG PO TABS
10.0000 mg | ORAL_TABLET | Freq: Two times a day (BID) | ORAL | 0 refills | Status: DC | PRN
  Filled 2023-09-27: qty 180, 90d supply, fill #0

## 2023-09-27 MED ORDER — TRAZODONE HCL 50 MG PO TABS
50.0000 mg | ORAL_TABLET | Freq: Every evening | ORAL | 0 refills | Status: DC | PRN
  Filled 2023-09-27: qty 90, 90d supply, fill #0

## 2023-09-27 NOTE — Progress Notes (Signed)
BH MD Outpatient Progress Note  09/27/2023 8:55 PM Malone Rairigh  MRN:  [id number]  Assessment:  Randy Braun presents for follow-up evaluation in-person. 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  Today, 09/27/23, patient reports continued financial stress and new stress of significant dental cavity which is concerning for him as this would cause significant amounts of money which he had to ask uncle for help with.  He continues to spend significant amount of money on CBD/cannabis for its euphoric effects.  He does not want to change any of his psychotropic medication at this time as he feels that it is relatively manageable and his stress has not significantly impaired his current level of functioning.  He does report that propranolol was significantly effective in helping him with his social anxiety and continues to use it as needed especially while at work.  Identifying Information: Rakin Braun is a 37 y.o. male with a history of MDD, anxiety, and insomnia who is an established patient with [facility] for medication management.   Plan:  # MDD, recurrent, moderate Past medication trials: Abilify Status of problem: active Interventions: -- continue bupropion XL 150 mg daily -- continue trazodone 50 mg at bedtime -- continue psychotherapy  # Cannabis Use Past medication trials:  Interventions: -- recommend cessation  # Generalized Anxiety Disorder Past medication trials:  Status of problem: active Interventions: -- Continue propranolol 10 mg bid prn   Return to care in 4 weeks  Patient was given contact information for behavioral health clinic and was instructed to call 911 for emergencies.    Patient and plan of care will be discussed with the Attending MD, Dr. Josephina Shih, who agrees with the above statement and plan.    Subjective:  Chief Complaint: Medication Management   Interval History:  Patient reports continuing to have difficulty with financial stressors related to getting paid less than he normally would and constantly ruminating about past problems and errors that he had done especially when he first started working at [employer].  She denies SI/HI/AVH.  He reports that his mood has been somewhat depressed due to struggling with financial stressors as well as having a significant dental cavity which she is fearful will require significant amounts of money for any type of procedure.  He reports he distracts himself with video games when he has the opportunity to relax at home.  He reports he continues to smoke CBD/THC regularly and has been frustrated that he has not been able to obtain the euphoric effects that he would normally experience when using cannabis.  He states he will work to decrease his CBD use as it does put a significant financial strain on him and does not appear to have the significant euphoric effects that he would like.  Visit Diagnosis:    ICD-10-CM   1. MDD (major depressive disorder), recurrent episode, moderate (HCC)  F33.1 buPROPion (WELLBUTRIN XL) 150 MG 24 hr tablet    propranolol (INDERAL) 10 MG tablet    traZODone (DESYREL) 50 MG tablet    2. Psychophysiological insomnia  F51.04 traZODone (DESYREL) 50 MG tablet         Past Psychiatric History:  Diagnoses: MDD, cannabis use disorder Medication trials: bupropion, abilify Previous psychiatrist/therapist: Dr. 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 Social History   Socioeconomic History   Marital status: Single    Spouse name: Not on file   Number of children: Not on file   Years of education: Not on file   Highest education level: Not on file  Occupational History   Not on file  Tobacco Use   Smoking status: Every Day    Types: Cigarettes   Smokeless tobacco: Never   Tobacco comments:    Patient is now vaping (05/06/2022)  Vaping Use   Vaping status: Every Day   Substances: Nicotine, THC  Substance and Sexual Activity   Alcohol use: Yes    Comment: occasionally   Drug use: Yes    Types: Marijuana   Sexual activity: Not on file  Other Topics Concern   Not on file  Social History Narrative   Not on file   Social Determinants of Health   Financial Resource Strain: Medium Risk (04/28/2021)   Overall Financial Resource Strain (CARDIA)    Difficulty of Paying Living Expenses: Somewhat hard  Food Insecurity: No Food Insecurity (04/28/2021)   Hunger Vital Sign    Worried About Running Out of Food in the Last Year: Never true    Ran Out of Food in the Last Year: Never true  Transportation Needs: No Transportation Needs (04/28/2021)   PRAPARE - Administrator, Civil Service (Medical): No    Lack of Transportation (Non-Medical): No  Physical Activity: Inactive (04/28/2021)   Exercise Vital Sign    Days of Exercise per Week: 0 days    Minutes of Exercise per Session: 0 min  Stress: Stress Concern Present (04/28/2021)   [employer] of Occupational Health - Occupational Stress Questionnaire    Feeling of Stress : To some extent  Social Connections: Socially Isolated (04/28/2021)   Social Connection and Isolation Panel [NHANES]    Frequency of Communication with Friends and Family: More than three times a week    Frequency of Social Gatherings with Friends and Family: Once a week    Attends Religious Services: Never     Database administrator or Organizations: No    Attends Banker Meetings: Never    Marital Status: Never married    Allergies:  Allergies  Allergen Reactions   Vinegar [Acetic Acid] Other (See Comments)    Red Vinegar- flush    Current Medications: Current Outpatient Medications  Medication Sig Dispense Refill   buPROPion (WELLBUTRIN XL) 150 MG 24 hr tablet Take 1 tablet (150 mg total) by mouth every morning. 90 tablet 0   ibuprofen (ADVIL) 800 MG tablet Take 800 mg by mouth every 8 (eight) hours as needed.     ibuprofen (IBU) 800 MG tablet Take 1 tablet by mouth once every 8 hours as needed for pain. 20 tablet 1   meloxicam (MOBIC) 7.5 MG tablet Take 1 tablet (7.5 mg total) by mouth daily. 30 tablet 1   penicillin v potassium (VEETID) 500 MG tablet Take 1 tablet (500 mg total) by mouth 4 (four) times daily. 24 tablet 0   propranolol (INDERAL) 10 MG  tablet Take 1 tablet (10 mg total) by mouth 2 (two) times daily as needed. 180 tablet 0   traMADol (ULTRAM) 50 MG tablet Take 1 tablet (50 mg total) by mouth every 6 (six) hours as needed for pain 10 tablet 0   traZODone (DESYREL) 50 MG tablet Take 1 tablet (50 mg total) by mouth at bedtime as needed for sleep. 90 tablet 0   No current facility-administered medications for this visit.    ROS: Review of Systems   Objective:  Psychiatric Specialty Exam: There were no vitals taken for this visit.There is no height or weight on file to calculate BMI.  General Appearance: Fairly Groomed  Eye Contact: Good  Speech:  Clear and Coherent and Normal Rate  Volume:  Normal  Mood:  Anxious and Depressed  Affect:  Congruent and Depressed  Thought Content: Logical   Suicidal Thoughts:  No  Homicidal Thoughts:  No  Thought Process:  Coherent, Goal Directed, and Linear  Orientation:  Full (Time, Place, and Person)    Memory: Remote;   Fair  Judgment:  Fair  Insight:  Fair  Concentration:  Concentration: Fair and Attention  Span: Fair  Recall: not formally assessed   Fund of Knowledge: Fair  Language: Fair  Psychomotor Activity:  Normal  Akathisia:  Negative  AIMS (if indicated): not done  Assets:  Communication Skills Desire for Improvement Financial Resources/Insurance Housing Leisure Time Physical Health Resilience Social Support Talents/Skills Vocational/Educational  ADL's:  Intact  Cognition: WNL  Sleep:  Fair   PE: General: well-appearing; no acute distress  Pulm: no increased work of breathing on room air  Strength & Muscle Tone: within normal limits Neuro: no focal neurological deficits observed  Gait & Station: normal  Metabolic Disorder Labs: Lab Results  Component Value Date   HGBA1C 6.1 (H) 04/19/2023   No results found for: "PROLACTIN" Lab Results  Component Value Date   CHOL 148 04/19/2023   TRIG 53 04/19/2023   HDL 51 04/19/2023   CHOLHDL 2.9 04/19/2023   LDLCALC 86 04/19/2023   LDLCALC 106 (H) 12/14/2022   Lab Results  Component Value Date   TSH 1.400 04/19/2023   TSH 1.420 12/14/2022    Therapeutic Level Labs: No results found for: "LITHIUM" No results found for: "VALPROATE" No results found for: "CBMZ"  Screenings: GAD-7    Flowsheet Row Office Visit from 04/19/2023 in [facility] Counselor from 11/10/2022 in [facility] Counselor from 07/07/2022 in [facility] Video Visit from 03/04/2022 in [facility] Counselor from 02/16/2022 in [facility]  Total GAD-7 Score 3 1 9 5 3       [id number]    Flowsheet Row Office Visit from 04/19/2023 in [facility] Counselor from 11/10/2022 in [facility] Counselor from 07/07/2022 in [facility] Video Visit from 03/04/2022 in [facility] Counselor from 02/16/2022 in [facility]  PHQ-2 Total Score 1 0 2 0 0  PHQ-9 Total Score 4 1 4  -- 2      Flowsheet Row Video Visit from 03/04/2022 in [facility] Video Visit from 12/31/2021 in [facility] Video Visit from 11/05/2021 in [facility]  C-SSRS RISK CATEGORY Low Risk Low Risk Moderate Risk       Collaboration of Care: Collaboration of  Care:  Patient/Guardian was advised Release of Information must be obtained prior to any record release in order to collaborate their care with an outside provider. Patient/Guardian was advised if they have not already done so to contact the registration department to sign all necessary forms in order for [place] to release information regarding their care.   Consent: Patient/Guardian gives verbal consent for treatment and assignment of benefits for services provided during this visit. Patient/Guardian expressed understanding and agreed to proceed.   Park Pope, MD 09/27/2023, 8:55 PM

## 2023-09-28 ENCOUNTER — Other Ambulatory Visit: Payer: Self-pay

## 2023-10-05 ENCOUNTER — Ambulatory Visit (INDEPENDENT_AMBULATORY_CARE_PROVIDER_SITE_OTHER): Payer: No Payment, Other | Admitting: Licensed Clinical Social Worker

## 2023-10-05 DIAGNOSIS — F331 Major depressive disorder, recurrent, moderate: Secondary | ICD-10-CM

## 2023-10-05 DIAGNOSIS — F411 Generalized anxiety disorder: Secondary | ICD-10-CM

## 2023-10-05 NOTE — Progress Notes (Signed)
THERAPIST PROGRESS NOTE  Session Time: 78  Participation Level: Active  Behavioral Response: CasualAlertAnxious and Depressed  Type of Therapy: Individual Therapy  Treatment Goals addressed:  Active     Bipolar Disorder CCP Problem  1 bipolar disorder depressed      Decrease PHQ-9 below 10  (Progressing)     Start:  12/08/21    Expected End:  10/25/23         Decrease GAD-7 below 5  (Progressing)     Start:  12/08/21    Expected End:  10/25/23         LTG: Stabilize mood and increase goal-directed behavior: Using positive affirmation 1 x daily  (Progressing)     Start:  12/08/21    Expected End:  10/25/23         STG: Randy Braun WILL COOPERATE WITH A PSYCHIATRIC EVALUATION ON 04/28/21 AND DURING ALL FOLLOW-UP VISITS (Completed/Met)     Start:  12/08/21    Expected End:  05/21/22    Resolved:  12/30/21      STG: Randy Braun WILL IDENTIFY COGNITIVE PATTERNS AND BELIEFS THAT INTERFERE WITH THERAPY (Progressing)     Start:  12/08/21    Expected End:  10/25/23         STG: Randy Braun WILL ATTEND AT LEAST 80% OF SCHEDULED FOLLOW-UP MEDICATION MANAGEMENT APPOINTMENTS (Completed/Met)     Start:  12/08/21    Expected End:  11/26/22    Resolved:  09/07/22       Decrease marijuana use to 0/7 days per week  (Progressing)     Start:  06/02/22    Expected End:  10/25/23            WORK WITH Randy Braun TO DISCUSS RISKS AND BENEFITS OF MEDICATION TREATMENT OPTIONS FOR THIS PROBLEM AND PRESCRIBE AS INDICATED (Completed)     Start:  12/08/21    End:  02/16/22      Conduct a review of all medications to evaluate any drug/drug interactions (Completed)     Start:  12/08/21    End:  02/16/22      REVIEW WITH Randy Braun THEIR RESPONSE TO THE PRESCRIBED MEDICATION, INCLUDING ANY SIDE EFFECTS (Completed)     Start:  12/08/21    End:  02/16/22      ENCOURAGE Randy Braun TO KEEP A LOG OF MEDICATION SIDE EFFECTS, QUESTIONS REGARDING MEDICATION, AND SYMPTOMS (Completed)     Start:   12/08/21    End:  02/16/22      INSTRUCT Randy Braun TO TAKE PSYCHOTROPIC MEDICATION AS PRESCRIBED (Completed)     Start:  12/08/21    End:  02/16/22      INSTRUCT Randy Braun TO COMMUNICATE EFFECTS OF PRESCRIBED MEDICATIONS (Completed)     Start:  12/08/21    End:  02/16/22      EDUCATE Randy Braun ON BIPOLAR DISORDER DIAGNOSTIC CRITERIA (Completed)     Start:  12/08/21    End:  02/16/22         ProgressTowards Goals: Progressing  Interventions: CBT, Motivational Interviewing, and Supportive  Summary: Randy Braun is a 37 y.o. male who presents with anxious mood\affect.  Patient was pleasant, cooperative, maintained good eye contact.  He was alert and oriented x 5.  He engaged well in therapy session was dressed casually.  Patient comes in today with primary stressors as work and illness.  Patient showed LCSW a picture of his tooth where he has a giant hole that stems down to the bone in his mouth.  Patient reports  that it is sharp but does not report pain to LCSW at this time.  Patient reports that he is looking into dentistry that can fix the tooth.  Patient reports that the only thing that helped him cope with the stress of the dental problem was a Nintendo switch and a Pokmon video game.  Patient also reports stressors at work.  He reports that he has had multiple incidents where he thought he was going to have to pay for items due to not following protocol.  Patient reports that on several occasions he thought he took on pawned  items that did not work or he did not collect all the items that went with the  pawned item.  Patient reports not yet having to pay for any items.  Patient reports also that they had an audit by ATF due to several cons being stolen and it was found out that the assistant manager was not supposed to be selling guns as she had a record.  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  Suicidal/Homicidal: Nowithout intent/plan  Therapist Response:     Intervention/Plan: LCSW supportive therapy for praise and encouragement.  LCSW educated patient on setting healthy boundaries at work.  LCSW advised patient's of understanding his limits and advocating for himself.  LCSW used person centered therapy for empowerment.  LCSW used motivational interviewing for reflective listening and open-ended questions.  Plan: Return again in 3 weeks.  Diagnosis: Generalized anxiety disorder  Major depressive disorder, recurrent episode, moderate (HCC)  Collaboration of Care: Other Dental resources   Patient/Guardian was advised Release of Information must be obtained prior to any record release in order to collaborate their care with an outside provider. Patient/Guardian was advised if they have not already done so to contact the registration department to sign all necessary forms in order for [place] to release information regarding their care.   Consent: Patient/Guardian gives verbal consent for treatment and assignment of benefits for services provided during this visit. Patient/Guardian expressed understanding and agreed to proceed.   Weber Cooks, LCSW 10/05/2023

## 2023-10-10 ENCOUNTER — Other Ambulatory Visit: Payer: Self-pay

## 2023-10-12 ENCOUNTER — Other Ambulatory Visit: Payer: Self-pay

## 2023-10-21 ENCOUNTER — Other Ambulatory Visit: Payer: Self-pay

## 2023-10-25 ENCOUNTER — Other Ambulatory Visit (HOSPITAL_BASED_OUTPATIENT_CLINIC_OR_DEPARTMENT_OTHER): Payer: Self-pay

## 2023-10-25 ENCOUNTER — Other Ambulatory Visit: Payer: Self-pay

## 2023-10-25 MED ORDER — DIAZEPAM 10 MG PO TABS
10.0000 mg | ORAL_TABLET | Freq: Once | ORAL | 0 refills | Status: AC
  Filled 2023-10-25 (×2): qty 1, 1d supply, fill #0

## 2023-10-27 ENCOUNTER — Other Ambulatory Visit (HOSPITAL_BASED_OUTPATIENT_CLINIC_OR_DEPARTMENT_OTHER): Payer: Self-pay

## 2023-10-27 ENCOUNTER — Other Ambulatory Visit: Payer: Self-pay

## 2023-10-27 MED ORDER — HYDROCODONE-ACETAMINOPHEN 10-325 MG PO TABS
1.0000 | ORAL_TABLET | Freq: Four times a day (QID) | ORAL | 0 refills | Status: DC | PRN
  Filled 2023-10-27 (×2): qty 12, 3d supply, fill #0

## 2023-11-01 ENCOUNTER — Ambulatory Visit (HOSPITAL_COMMUNITY): Payer: No Payment, Other | Admitting: Licensed Clinical Social Worker

## 2023-11-03 ENCOUNTER — Ambulatory Visit (INDEPENDENT_AMBULATORY_CARE_PROVIDER_SITE_OTHER): Payer: No Payment, Other | Admitting: Licensed Clinical Social Worker

## 2023-11-03 DIAGNOSIS — F411 Generalized anxiety disorder: Secondary | ICD-10-CM | POA: Diagnosis not present

## 2023-11-03 DIAGNOSIS — F331 Major depressive disorder, recurrent, moderate: Secondary | ICD-10-CM

## 2023-11-03 NOTE — Progress Notes (Signed)
 THERAPIST PROGRESS NOTE  Virtual Visit via Video Note  I connected with Randy Braun on 11/03/23 at  4:00 PM EST by a video enabled telemedicine application and verified that I am speaking with the correct person using two identifiers.  Location: Patient: Randy Braun  Provider: Providers Home    I discussed the limitations of evaluation and management by telemedicine and the availability of in person appointments. The patient expressed understanding and agreed to proceed.  I discussed the assessment and treatment plan with the patient. The patient was provided an opportunity to ask questions and all were answered. The patient agreed with the plan and demonstrated an understanding of the instructions.   The patient was advised to call back or seek an in-person evaluation if the symptoms worsen or if the condition fails to improve as anticipated.  I provided 55 minutes of non-face-to-face time during this encounter.   Randy GORMAN Patee, LCSW   Participation Level: Active  Behavioral Response: CasualAlertAnxious and Depressed  Type of Therapy: Individual Therapy  Treatment Goals addressed:  Active     Bipolar Disorder CCP Problem  1 bipolar disorder depressed      Decrease PHQ-9 below 10  (Progressing)     Start:  12/08/21    Expected End:  04/23/24         Decrease GAD-7 below 5  (Progressing)     Start:  12/08/21    Expected End:  04/23/24         LTG: Stabilize mood and increase goal-directed behavior: Using positive affirmation 1 x daily  (Progressing)     Start:  12/08/21    Expected End:  04/23/24         STG: Randy Braun WILL COOPERATE WITH A PSYCHIATRIC EVALUATION ON 04/28/21 AND DURING ALL FOLLOW-UP VISITS (Completed/Met)     Start:  12/08/21    Expected End:  05/21/22    Resolved:  12/30/21      STG: Randy Braun WILL IDENTIFY COGNITIVE PATTERNS AND BELIEFS THAT INTERFERE WITH THERAPY (Progressing)     Start:  12/08/21    Expected End:  04/23/24          STG: Randy Braun WILL ATTEND AT LEAST 80% OF SCHEDULED FOLLOW-UP MEDICATION MANAGEMENT APPOINTMENTS (Completed/Met)     Start:  12/08/21    Expected End:  11/26/22    Resolved:  09/07/22       Decrease marijuana use to 0/7 days per week  (Progressing)     Start:  06/02/22    Expected End:  04/23/24         WORK WITH Randy Braun TO DISCUSS RISKS AND BENEFITS OF MEDICATION TREATMENT OPTIONS FOR THIS PROBLEM AND PRESCRIBE AS INDICATED (Completed)     Start:  12/08/21    End:  02/16/22      Conduct a review of all medications to evaluate any drug/drug interactions (Completed)     Start:  12/08/21    End:  02/16/22      REVIEW WITH Randy Braun THEIR RESPONSE TO THE PRESCRIBED MEDICATION, INCLUDING ANY SIDE EFFECTS (Completed)     Start:  12/08/21    End:  02/16/22      ENCOURAGE Randy Braun TO KEEP A LOG OF MEDICATION SIDE EFFECTS, QUESTIONS REGARDING MEDICATION, AND SYMPTOMS (Completed)     Start:  12/08/21    End:  02/16/22      INSTRUCT Randy Braun TO TAKE PSYCHOTROPIC MEDICATION AS PRESCRIBED (Completed)     Start:  12/08/21    End:  02/16/22  INSTRUCT Randy Braun TO COMMUNICATE EFFECTS OF PRESCRIBED MEDICATIONS (Completed)     Start:  12/08/21    End:  02/16/22      EDUCATE Randy Braun ON BIPOLAR DISORDER DIAGNOSTIC CRITERIA (Completed)     Start:  12/08/21    End:  02/16/22         ProgressTowards Goals: Progressing  Interventions: CBT, Motivational Interviewing, and Supportive  Suicidal/Homicidal: Nowithout intent/plan  Therapist Response:    Patient was alert and oriented x 5.  He was pleasant, cooperative, and maintained good eye contact. Randy Braun presented with anxious mood\affect.  He engaged well in therapy session was dressed casually.  Patient comes in today with primary stressors as work.  He endorses symptoms for tension, worry, and fatigue.  He reports that at work his international aid/development worker is under investigation by [employer] misfiling paperwork for a  firearm in [name] .  Patient reports this is because he works at a [employer] and certain paperwork needs to be filed when they take on firearms or sell them.  Patient reports that the assistant manager does have a record due to undisclosed charges that were not reported to the ATF.  This has Effected patient as he has taken on more responsibility at work.  Patient reports that he is frustrated with the situation because one of his colleagues a 38 year old male has not helped to the same standard that he is.  He reports that this is frustrating because he is still getting paid the same amount but taking on international aid/development worker duties. Randy Braun states that he feels at times is 65 year old colleague may be having an inappropriate relationship with the manager of the [name].   Other stressors for patient are illness.  He reports that he needed to have a tooth pulled and patient reports that he does not do good with procedures.  He reports that they gave him some Valium  that helped while they were pulling the tooth.Randy Braun reports that he has struggle financially as this caused him over $300.   Intervention/plan: LCSW supportive therapy for praise and encouragement.  LCSW used psycho analytic therapy for patient to express thoughts, feelings and emotions and nonjudgmental environment.  LCSW used motivational interviewing for open-ended questions and reflective listening.  LCSW educated patient on the benefits of engaging in extracurricular activities such as hobbies to help decrease depression, anxiety, and hopelessness. Plan: Return again in 3 weeks.  Diagnosis: Generalized anxiety disorder  Major depressive disorder, recurrent episode, moderate (HCC)  Collaboration of Care: Other None today   Patient/Guardian was advised Release of Information must be obtained prior to any record release in order to collaborate their care with an outside provider. Patient/Guardian was advised if they have not  already done so to contact the registration department to sign all necessary forms in order for us  to release information regarding their care.   Consent: Patient/Guardian gives verbal consent for treatment and assignment of benefits for services provided during this visit. Patient/Guardian expressed understanding and agreed to proceed.   Randy GORMAN Patee, LCSW 11/03/2023

## 2023-11-29 ENCOUNTER — Encounter (HOSPITAL_COMMUNITY): Payer: No Payment, Other | Admitting: Student

## 2023-11-30 ENCOUNTER — Ambulatory Visit (HOSPITAL_COMMUNITY): Payer: No Payment, Other | Admitting: Licensed Clinical Social Worker

## 2023-12-06 ENCOUNTER — Ambulatory Visit (HOSPITAL_COMMUNITY): Payer: No Payment, Other | Admitting: Licensed Clinical Social Worker

## 2023-12-07 NOTE — Progress Notes (Unsigned)
BH MD Outpatient Progress Note  12/08/2023 1:00 PM Randy Braun  MRN:  [id number]  Assessment:  Randy Braun presents for follow-up evaluation in-person. 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  This has led to some sleep problems but overall feels his psychotropic medications have made it so that her his MDD and GAD have been manageable.  He has been taking medications as prescribed and requires propranolol once every 1 to 2 weeks.  He reports continued cannabis use.  No psychotropic adjustments were made to medications today.  Patient will follow-up in 2 to 3 months.   Identifying Information: Randy Braun is a 38 y.o. male with a history of MDD, anxiety, and insomnia who is an established patient with [facility] for medication management.   Plan:  # MDD, recurrent, moderate Past medication trials: Abilify Status of problem: active Interventions: -- continue bupropion XL 150 mg daily -- continue trazodone 50 mg at bedtime -- continue psychotherapy  # Cannabis Use Past medication trials:  Interventions: -- recommend cessation  # Generalized Anxiety Disorder Past medication trials:  Status of problem: active Interventions: -- Continue propranolol 10 mg bid prn   Return to care in 4 weeks  Patient was given contact information for behavioral health clinic and was instructed to call 911 for emergencies.    Patient and plan of care will be discussed with the Attending MD, Dr. Josephina Shih, who agrees with the above statement and plan.   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  He reports that the pain is minimally increased but still not meeting the prior hourly rate he was working from former [employer].  He denies SI/HI/AVH.  He reports sleep has been fair although recently has been somewhat worse due to researching cryptocurrency and wanting to make money from it.  He reports appetite is fair.  Patient would like to continue present doses of psychotropics and denies any acute somatic complaints from continuing these medications.  Patient continues to use cannabis.  Patient usually is playing video games or researching cryptocurrency when he is not working.  He reports minimal social interactions at times but will speak with people online.  Visit Diagnosis:    ICD-10-CM   1. MDD (major depressive disorder), recurrent episode, moderate (HCC)  F33.1 buPROPion (WELLBUTRIN XL) 150 MG 24 hr tablet    traZODone (DESYREL) 50 MG tablet    DISCONTINUED: buPROPion (WELLBUTRIN XL) 150 MG 24 hr tablet    2. Psychophysiological insomnia  F51.04 traZODone (DESYREL) 50 MG tablet        Past Psychiatric History:  Diagnoses: MDD, cannabis use disorder Medication trials: bupropion, abilify Previous psychiatrist/therapist: Dr. 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 Social History   Socioeconomic History   Marital status: Single    Spouse name: Not on file   Number of children: Not on  file   Years of education: Not on file   Highest education level: Not on file  Occupational History   Not on file  Tobacco Use   Smoking status: Every Day    Types: Cigarettes   Smokeless tobacco: Never   Tobacco comments:    Patient is now vaping (05/06/2022)  Vaping Use   Vaping status: Every Day   Substances: Nicotine, THC  Substance and Sexual Activity   Alcohol use: Yes    Comment: occasionally   Drug use: Yes    Types: Marijuana   Sexual activity: Not on file  Other Topics Concern   Not on file  Social History Narrative   Not on file   Social Drivers of Health   Financial Resource Strain: Medium Risk (04/28/2021)   Overall Financial Resource Strain (CARDIA)    Difficulty of Paying Living Expenses: Somewhat hard  Food Insecurity: No Food Insecurity (04/28/2021)   Hunger Vital Sign    Worried About Running Out of Food in the Last Year: Never true    Ran Out of Food in the Last Year: Never true  Transportation Needs: No Transportation Needs (04/28/2021)   PRAPARE - Administrator, Civil Service (Medical): No    Lack of Transportation (Non-Medical): No  Physical Activity: Inactive (04/28/2021)   Exercise Vital Sign    Days of Exercise per Week: 0 days    Minutes of Exercise per Session: 0 min  Stress: Stress Concern Present (04/28/2021)   [employer] of Occupational Health - Occupational Stress Questionnaire    Feeling of Stress : To some extent  Social Connections: Socially Isolated (04/28/2021)   Social Connection and Isolation Panel [NHANES]    Frequency of Communication with Friends and Family: More than three times a week    Frequency of Social Gatherings with Friends and Family: Once a week    Attends Religious Services: Never    Database administrator or Organizations: No    Attends Banker Meetings: Never    Marital Status: Never married    Allergies:  Allergies  Allergen Reactions   Vinegar [Acetic Acid] Other (See Comments)     Red Vinegar- flush    Current Medications: Current Outpatient Medications  Medication Sig Dispense Refill   [START ON 12/24/2023] buPROPion (WELLBUTRIN XL) 150 MG 24 hr tablet Take 1 tablet (150 mg total) by mouth every morning. 90 tablet 0   HYDROcodone-acetaminophen (NORCO) 10-325 MG tablet Take 1 tablet by mouth every 6 (six) hours as needed for pain. 12 tablet 0   ibuprofen (ADVIL) 800 MG tablet Take 800 mg by mouth every 8 (eight) hours as needed.     ibuprofen (IBU) 800 MG tablet Take 1 tablet by mouth once every 8 hours as needed for pain. 20 tablet 1   meloxicam (MOBIC) 7.5 MG tablet Take 1 tablet (7.5 mg total) by mouth daily. 30 tablet 1   penicillin v potassium (VEETID) 500 MG tablet Take 1 tablet (500 mg total) by mouth 4 (four) times daily. 24 tablet 0   propranolol (INDERAL) 10 MG tablet Take 1 tablet (10 mg total) by mouth 2 (  two) times daily as needed. 180 tablet 0   traMADol (ULTRAM) 50 MG tablet Take 1 tablet (50 mg total) by mouth every 6 (six) hours as needed for pain 10 tablet 0   [START ON 12/24/2023] traZODone (DESYREL) 50 MG tablet Take 1 tablet (50 mg total) by mouth at bedtime as needed for sleep. 90 tablet 0   No current facility-administered medications for this visit.    ROS: Review of Systems   Objective:  Psychiatric Specialty Exam: There were no vitals taken for this visit.There is no height or weight on file to calculate BMI.  General Appearance: Fairly Groomed  Eye Contact: Good  Speech:  Clear and Coherent and Normal Rate  Volume:  Normal  Mood:  Anxious and Depressed  Affect:  Congruent and Depressed  Thought Content: Logical   Suicidal Thoughts:  No  Homicidal Thoughts:  No  Thought Process:  Coherent, Goal Directed, and Linear  Orientation:  Full (Time, Place, and Person)    Memory: Remote;   Fair  Judgment:  Fair  Insight:  Fair  Concentration:  Concentration: Fair and Attention Span: Fair  Recall: not formally assessed   Fund of  Knowledge: Fair  Language: Fair  Psychomotor Activity:  Normal  Akathisia:  Negative  AIMS (if indicated): not done  Assets:  Communication Skills Desire for Improvement Financial Resources/Insurance Housing Leisure Time Physical Health Resilience Social Support Talents/Skills Vocational/Educational  ADL's:  Intact  Cognition: WNL  Sleep:  Fair   PE: General: well-appearing; no acute distress  Pulm: no increased work of breathing on room air  Strength & Muscle Tone: within normal limits Neuro: no focal neurological deficits observed  Gait & Station: normal  Metabolic Disorder Labs: Lab Results  Component Value Date   HGBA1C 6.1 (H) 04/19/2023   No results found for: "PROLACTIN" Lab Results  Component Value Date   CHOL 148 04/19/2023   TRIG 53 04/19/2023   HDL 51 04/19/2023   CHOLHDL 2.9 04/19/2023   LDLCALC 86 04/19/2023   LDLCALC 106 (H) 12/14/2022   Lab Results  Component Value Date   TSH 1.400 04/19/2023   TSH 1.420 12/14/2022    Therapeutic Level Labs: No results found for: "LITHIUM" No results found for: "VALPROATE" No results found for: "CBMZ"  Screenings: GAD-7    Flowsheet Row Office Visit from 04/19/2023 in [facility] Counselor from 11/10/2022 in [facility] Counselor from 07/07/2022 in [facility] Video Visit from 03/04/2022 in [facility] Counselor from 02/16/2022 in [facility]  Total GAD-7 Score 3 1 9 5 3       PHQ2-9    Flowsheet Row Office Visit from 04/19/2023 in [facility] Counselor from 11/10/2022 in [facility] Counselor from 07/07/2022 in [facility] Video Visit from 03/04/2022 in [facility] Counselor from 02/16/2022 in [facility]  PHQ-2 Total Score 1 0  2 0 0  PHQ-9 Total Score 4 1 4  -- 2      Flowsheet Row Video Visit from 03/04/2022 in [facility] Video Visit from 12/31/2021 in [facility] Video Visit from 11/05/2021 in [facility]  C-SSRS RISK CATEGORY Low Risk Low Risk Moderate Risk       Collaboration of Care: Collaboration of Care:  Patient/Guardian was advised Release of  Information must be obtained prior to any record release in order to collaborate their care with an outside provider. Patient/Guardian was advised if they have not already done so to contact the registration department to sign all necessary forms in order for [place] to release information regarding their care.   Consent: Patient/Guardian gives verbal consent for treatment and assignment of benefits for services provided during this visit. Patient/Guardian expressed understanding and agreed to proceed.   Park Pope, MD 12/08/2023, 1:00 PM

## 2023-12-08 ENCOUNTER — Other Ambulatory Visit: Payer: Self-pay

## 2023-12-08 ENCOUNTER — Ambulatory Visit (INDEPENDENT_AMBULATORY_CARE_PROVIDER_SITE_OTHER): Payer: No Payment, Other | Admitting: Student

## 2023-12-08 DIAGNOSIS — F331 Major depressive disorder, recurrent, moderate: Secondary | ICD-10-CM | POA: Diagnosis not present

## 2023-12-08 DIAGNOSIS — F5104 Psychophysiologic insomnia: Secondary | ICD-10-CM | POA: Diagnosis not present

## 2023-12-08 MED ORDER — BUPROPION HCL ER (XL) 150 MG PO TB24
150.0000 mg | ORAL_TABLET | ORAL | 0 refills | Status: DC
  Filled 2023-12-08 – 2024-01-26 (×2): qty 90, 90d supply, fill #0

## 2023-12-08 MED ORDER — TRAZODONE HCL 50 MG PO TABS
50.0000 mg | ORAL_TABLET | Freq: Every evening | ORAL | 0 refills | Status: DC | PRN
  Filled 2023-12-08: qty 90, 90d supply, fill #0

## 2023-12-08 MED ORDER — BUPROPION HCL ER (XL) 150 MG PO TB24
150.0000 mg | ORAL_TABLET | ORAL | 0 refills | Status: DC
Start: 2023-12-08 — End: 2023-12-08
  Filled 2023-12-08: qty 90, 90d supply, fill #0

## 2023-12-12 ENCOUNTER — Other Ambulatory Visit: Payer: Self-pay

## 2023-12-15 ENCOUNTER — Ambulatory Visit (HOSPITAL_COMMUNITY): Payer: No Payment, Other | Admitting: Licensed Clinical Social Worker

## 2023-12-15 ENCOUNTER — Other Ambulatory Visit: Payer: Self-pay

## 2023-12-15 DIAGNOSIS — F411 Generalized anxiety disorder: Secondary | ICD-10-CM

## 2023-12-15 DIAGNOSIS — F5105 Insomnia due to other mental disorder: Secondary | ICD-10-CM

## 2023-12-15 DIAGNOSIS — F331 Major depressive disorder, recurrent, moderate: Secondary | ICD-10-CM

## 2023-12-15 NOTE — Progress Notes (Unsigned)
THERAPIST PROGRESS NOTE  Virtual Visit via Video Note  I connected with Randy Braun on 12/15/23 at  3:00 PM EST by a video enabled telemedicine application and verified that I am speaking with the correct person using two identifiers.  Location: Patient: Randy Braun  Provider: Providers Home    I discussed the limitations of evaluation and management by telemedicine and the availability of in person appointments. The patient expressed understanding and agreed to proceed.     I discussed the assessment and treatment plan with the patient. The patient was provided an opportunity to ask questions and all were answered. The patient agreed with the plan and demonstrated an understanding of the instructions.   The patient was advised to call back or seek an in-person evaluation if the symptoms worsen or if the condition fails to improve as anticipated.  I provided 45 minutes of non-face-to-face time during this encounter.   Randy Cooks, LCSW   Participation Level: Active  Behavioral Response: CasualAlertAnxious and Depressed  Type of Therapy: Individual Therapy  Treatment Goals addressed:  Active     Bipolar Disorder CCP Problem  1 bipolar disorder depressed      Decrease PHQ-9 below 10  (Progressing)     Start:  12/08/21    Expected End:  04/23/24         Decrease GAD-7 below 5  (Progressing)     Start:  12/08/21    Expected End:  04/23/24         LTG: Stabilize mood and increase goal-directed behavior: Using positive affirmation 1 x daily  (Progressing)     Start:  12/08/21    Expected End:  04/23/24         STG: Randy Braun WILL COOPERATE WITH A PSYCHIATRIC EVALUATION ON 04/28/21 AND DURING ALL FOLLOW-UP VISITS (Completed/Met)     Start:  12/08/21    Expected End:  05/21/22    Resolved:  12/30/21      STG: [id number] WILL IDENTIFY COGNITIVE PATTERNS AND BELIEFS THAT INTERFERE WITH THERAPY (Progressing)     Start:  12/08/21    Expected End:  04/23/24          STG: Randy Braun WILL ATTEND AT LEAST 80% OF SCHEDULED FOLLOW-UP MEDICATION MANAGEMENT APPOINTMENTS (Completed/Met)     Start:  12/08/21    Expected End:  11/26/22    Resolved:  09/07/22       Decrease marijuana use to 0/7 days per week  (Progressing)     Start:  06/02/22    Expected End:  04/23/24         WORK WITH Randy Braun TO DISCUSS RISKS AND BENEFITS OF MEDICATION TREATMENT OPTIONS FOR THIS PROBLEM AND PRESCRIBE AS INDICATED (Completed)     Start:  12/08/21    End:  02/16/22      Conduct a review of all medications to evaluate any drug/drug interactions (Completed)     Start:  12/08/21    End:  02/16/22      REVIEW WITH Randy Braun THEIR RESPONSE TO THE PRESCRIBED MEDICATION, INCLUDING ANY SIDE EFFECTS (Completed)     Start:  12/08/21    End:  02/16/22      ENCOURAGE Randy Braun TO KEEP A LOG OF MEDICATION SIDE EFFECTS, QUESTIONS REGARDING MEDICATION, AND SYMPTOMS (Completed)     Start:  12/08/21    End:  02/16/22      INSTRUCT Randy Braun TO TAKE PSYCHOTROPIC MEDICATION AS PRESCRIBED (Completed)     Start:  12/08/21    End:  02/16/22      INSTRUCT Randy Braun TO COMMUNICATE EFFECTS OF PRESCRIBED MEDICATIONS (Completed)     Start:  12/08/21    End:  02/16/22      EDUCATE Randy Braun ON BIPOLAR DISORDER DIAGNOSTIC CRITERIA (Completed)     Start:  12/08/21    End:  02/16/22         ProgressTowards Goals: Progressing  Interventions: CBT, Motivational Interviewing, and Supportive    Suicidal/Homicidal: Nowithout intent/plan  Therapist Response:     Patient was alert and oriented x 5.  He was pleasant, cooperative, maintained good eye contact.  He engaged well in therapy session was dressed casually.  He presented today with anxious mood\affect.  Patient reports today primary stressor is work.  He reports that he is not enjoying the people that he works with.  This is due to management setting special precedence for other employees other than himself.  Patient reports  that he is help to a higher standard and when other employees break the rules they are allowed to get away with it.  Patient states today that he tries to remain hopeful but it is hard when he feels that other people receives special treatment.  Patient endorses symptoms for tension, worry, irritability, and anger.  Intervention/plan: LCSW utilized psycho analytic therapy for patient to express thoughts, feelings and emotions in session using nonjudgmental stance.  LCSW utilized supportive therapy for praise and encouragement.  LCSW utilized cognitive behavioral therapy for cognitive restructuring and reframing.   Plan: Return again in 4 weeks.  Diagnosis: Generalized anxiety disorder  Major depressive disorder, recurrent episode, moderate (HCC)  Insomnia due to other mental disorder  Collaboration of Care: Other None today   Patient/Guardian was advised Release of Information must be obtained prior to any record release in order to collaborate their care with an outside provider. Patient/Guardian was advised if they have not already done so to contact the registration department to sign all necessary forms in order for [place] to release information regarding their care.   Consent: Patient/Guardian gives verbal consent for treatment and assignment of benefits for services provided during this visit. Patient/Guardian expressed understanding and agreed to proceed.   Randy Cooks, LCSW 12/15/2023

## 2023-12-27 ENCOUNTER — Ambulatory Visit (HOSPITAL_COMMUNITY): Payer: No Payment, Other | Admitting: Licensed Clinical Social Worker

## 2023-12-28 ENCOUNTER — Telehealth (HOSPITAL_COMMUNITY): Payer: Self-pay

## 2023-12-28 ENCOUNTER — Ambulatory Visit (HOSPITAL_COMMUNITY): Payer: No Payment, Other | Admitting: Licensed Clinical Social Worker

## 2023-12-28 NOTE — Telephone Encounter (Signed)
 Medication management - Telephone call with pt, after he left several that he was still getting a bill from [facility] for labs completed on 12/14/22 when he had no insurance. Informed patient this nurse manager had sent an email to Toney Rakes, with [facility] to request this bill be taken care of as part of our indigent agreement and that patient did not have insurance at the time drawn and completed from 12/14/22.  Requested Ms. Wester contact this Facilities manager back if any issues and patient informed this does take time at times to get handled so would keep him posted once a response obtained from Ms. Wester with [employer]. Patient agreed with plan and stated it had been some time since he had gotten any more bills or notices so perhaps this has been fixed.

## 2024-01-18 ENCOUNTER — Ambulatory Visit (HOSPITAL_COMMUNITY): Payer: No Payment, Other | Admitting: Licensed Clinical Social Worker

## 2024-01-18 DIAGNOSIS — F411 Generalized anxiety disorder: Secondary | ICD-10-CM | POA: Diagnosis not present

## 2024-01-18 DIAGNOSIS — F331 Major depressive disorder, recurrent, moderate: Secondary | ICD-10-CM

## 2024-01-18 NOTE — Progress Notes (Signed)
 THERAPIST PROGRESS NOTE  Virtual Visit via Video Note  I connected with Margarito Courser on 01/18/24 at  4:00 PM EDT by a video enabled telemedicine application and verified that I am speaking with the correct person using two identifiers.  Location: Patient: [facility]  Provider: [facility]    I discussed the limitations of evaluation and management by telemedicine and the availability of in person appointments. The patient expressed understanding and agreed to proceed.      I discussed the assessment and treatment plan with the patient. The patient was provided an opportunity to ask questions and all were answered. The patient agreed with the plan and demonstrated an understanding of the instructions.   The patient was advised to call back or seek an in-person evaluation if the symptoms worsen or if the condition fails to improve as anticipated.  I provided 55 minutes of non-face-to-face time during this encounter.   Weber Cooks, LCSW   Participation Level: Active  Behavioral Response: CasualAlertAnxious and Depressed  Type of Therapy: Individual Therapy  Treatment Goals addressed:  Active     Bipolar Disorder CCP Problem  1 bipolar disorder depressed      Decrease PHQ-9 below 10  (Progressing)     Start:  12/08/21    Expected End:  04/23/24         Decrease GAD-7 below 5  (Progressing)     Start:  12/08/21    Expected End:  04/23/24         LTG: Stabilize mood and increase goal-directed behavior: Using positive affirmation 1 x daily  (Progressing)     Start:  12/08/21    Expected End:  04/23/24         STG: Vishaal WILL COOPERATE WITH A PSYCHIATRIC EVALUATION ON 04/28/21 AND DURING ALL FOLLOW-UP VISITS (Completed/Met)     Start:  12/08/21    Expected End:  05/21/22    Resolved:  12/30/21      STG: [id number] WILL IDENTIFY COGNITIVE PATTERNS AND BELIEFS THAT INTERFERE WITH THERAPY (Progressing)     Start:  12/08/21    Expected End:  04/23/24          STG: [name] WILL ATTEND AT LEAST 80% OF SCHEDULED FOLLOW-UP MEDICATION MANAGEMENT APPOINTMENTS (Completed/Met)     Start:  12/08/21    Expected End:  11/26/22    Resolved:  09/07/22       Decrease marijuana use to 0/7 days per week  (Progressing)     Start:  06/02/22    Expected End:  04/23/24         WORK WITH Jerryl TO DISCUSS RISKS AND BENEFITS OF MEDICATION TREATMENT OPTIONS FOR THIS PROBLEM AND PRESCRIBE AS INDICATED (Completed)     Start:  12/08/21    End:  02/16/22      Conduct a review of all medications to evaluate any drug/drug interactions (Completed)     Start:  12/08/21    End:  02/16/22      REVIEW WITH Issai THEIR RESPONSE TO THE PRESCRIBED MEDICATION, INCLUDING ANY SIDE EFFECTS (Completed)     Start:  12/08/21    End:  02/16/22      ENCOURAGE Dmoni TO KEEP A LOG OF MEDICATION SIDE EFFECTS, QUESTIONS REGARDING MEDICATION, AND SYMPTOMS (Completed)     Start:  12/08/21    End:  02/16/22      INSTRUCT Jobe TO TAKE PSYCHOTROPIC MEDICATION AS PRESCRIBED (Completed)     Start:  12/08/21  End:  02/16/22      INSTRUCT Marcellas TO COMMUNICATE EFFECTS OF PRESCRIBED MEDICATIONS (Completed)     Start:  12/08/21    End:  02/16/22      EDUCATE Rodd ON BIPOLAR DISORDER DIAGNOSTIC CRITERIA (Completed)     Start:  12/08/21    End:  02/16/22         ProgressTowards Goals: Progressing  Interventions: CBT, Motivational Interviewing, and Supportive   Suicidal/Homicidal: Nowithout intent/plan  Therapist Response:    Pt was alert and oriented x 5. He was pleasant, cooperative and maintained good eye contact. Larwence engaged well in therapy session and was dressed casually. He presented with anxious mood/affect  Primary stressors as work and finding a Event organiser. He reports that he has been stressed at work. Torion states that he feels his work ethic is higher than the people around him. He wants to be able to have things run smoothly but  different people are held to different standards. Lenox states today that he struggles not to worry about others and how they do their job. 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   Gerrod reports that he wants to find a career. He feels he has lost a lot of time due to the fact he was homeless and not always worried about a career. He met an old family friend that stated she believes his calling is in public speaking and helping others. Pearley is skeptical as he does not enjoy public speaking. He wants to make up for lost time but does not know how yet.   Final stressor spoke about in today's session was financials. He reports he is in a program where they take a certain amount of income out of check which is in the ballpark of [address]109 dollars. He reports that he maybe getting the lump sum as he has been at it for some time and still the program has yet to come through for what they promised him.   Intervention/plan: LCSW utilized psychoanalytic therapy for pt to express thoughts, feeling and emotions in nonjudgmental environment. LCSW used CBT for reframing. LCSW used supportive therapy for praise and encouragement. LCSW use person centered therapy for unconditional positive regard.   Intervention/plan:   Plan: Return again in 4 weeks.  Diagnosis: Generalized anxiety disorder  Major depressive disorder, recurrent episode, moderate (HCC)  Collaboration of Care: Other None today   Patient/Guardian was advised Release of Information must be obtained prior to any record release in order to collaborate their care with an outside provider. Patient/Guardian was advised if they have not already done so to contact the registration department to sign all necessary forms in order for [place] to release information regarding their care.   Consent: Patient/Guardian gives verbal consent for treatment and assignment of benefits for services  provided during this visit. Patient/Guardian expressed understanding and agreed to proceed.   Weber Cooks, LCSW 01/18/2024

## 2024-01-19 NOTE — Telephone Encounter (Signed)
 Copied from CRM [id number]. Topic: Appointments - Transfer of Care >> Jan 19, 2024  3:45 PM Victorino Dike T wrote: Pt is requesting to transfer FROM: Amy Stephens Pt is requesting to transfer TO: Dr Mikle Bosworth Reason for requested transfer: insurance change It is the responsibility of the team the patient would like to transfer to (Dr. Dr Ples Specter) to reach out to the patient if for any reason this transfer is not acceptable.

## 2024-01-26 ENCOUNTER — Other Ambulatory Visit: Payer: Self-pay

## 2024-01-26 ENCOUNTER — Telehealth: Payer: Self-pay | Admitting: Family

## 2024-01-26 ENCOUNTER — Ambulatory Visit (HOSPITAL_COMMUNITY): Payer: No Payment, Other | Admitting: Licensed Clinical Social Worker

## 2024-01-26 NOTE — Telephone Encounter (Signed)
 Copied from CRM [id number]. Topic: Medical Record Request - Other >> Jan 26, 2024 10:29 AM Pierre Bali B wrote: Reason for CRM: patient called in to see if he can have his records sent to another facility .  [facility]  [address] [place], [place] [address]  Fax number : [phone]

## 2024-01-26 NOTE — Telephone Encounter (Signed)
 Noted.

## 2024-01-26 NOTE — Telephone Encounter (Signed)
 Message patient to let him know we can send records but we will need signed consent form

## 2024-02-01 ENCOUNTER — Other Ambulatory Visit: Payer: Self-pay

## 2024-02-01 MED ORDER — AMMONIUM LACTATE 12 % EX CREA
1.0000 | TOPICAL_CREAM | Freq: Every day | CUTANEOUS | 1 refills | Status: AC
  Filled 2024-02-01: qty 385, 30d supply, fill #0

## 2024-02-01 MED ORDER — MELOXICAM 7.5 MG PO TABS
7.5000 mg | ORAL_TABLET | Freq: Every day | ORAL | 0 refills | Status: DC
  Filled 2024-02-01: qty 90, 90d supply, fill #0

## 2024-02-01 MED ORDER — PREDNISONE 5 MG PO TABS
5.0000 mg | ORAL_TABLET | Freq: Every day | ORAL | 3 refills | Status: DC
  Filled 2024-02-01: qty 30, 30d supply, fill #0

## 2024-02-01 MED ORDER — METHOCARBAMOL 500 MG PO TABS
500.0000 mg | ORAL_TABLET | Freq: Three times a day (TID) | ORAL | 0 refills | Status: DC
  Filled 2024-02-01: qty 30, 10d supply, fill #0

## 2024-02-02 ENCOUNTER — Ambulatory Visit (HOSPITAL_COMMUNITY): Admitting: Student

## 2024-02-02 ENCOUNTER — Other Ambulatory Visit: Payer: Self-pay

## 2024-02-02 DIAGNOSIS — F331 Major depressive disorder, recurrent, moderate: Secondary | ICD-10-CM | POA: Diagnosis not present

## 2024-02-02 DIAGNOSIS — F5104 Psychophysiologic insomnia: Secondary | ICD-10-CM

## 2024-02-02 NOTE — Progress Notes (Signed)
 BH MD Outpatient Progress Note  02/02/2024 4:29 PM Randy Braun  MRN:  [id number]  Assessment:  Randy Braun presents for follow-up evaluation in-person. 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  Today, 02/02/24, patient reports continued financial stress and is looking to transition from current job. Reports symptoms of MDD and GAD are stable.  He has been taking medications as prescribed and uses propranolol as needed for social anxiety.  He reports continued hemp use.  No psychotropic adjustments were made to medications today.  Patient will follow-up in 2 to 3 months.  Patient was made aware of transition of care.  Patient verbalized wanting to either stay with me as provider or transition to a more stable provider starting July 2025.  We discussed going over in June appointment to clarify this.   Identifying Information: Randy Braun is a 38 y.o. male with a history of MDD, anxiety, and insomnia who is an established patient with [facility] for medication management.   Plan:  # MDD, recurrent, moderate Past medication trials: Abilify Status of problem: active Interventions: -- continue bupropion XL 150 mg daily -- continue trazodone 50 mg at bedtime -- continue psychotherapy  # Cannabis Use Past medication trials:  Interventions: -- recommend cessation  # Generalized Anxiety Disorder Past medication trials:  Status of problem: active Interventions: -- Continue propranolol 10 mg bid prn   Return to care in 4 weeks  Patient was given contact information for behavioral health clinic and was instructed to call 911 for emergencies.    Patient and plan of care will be discussed with the Attending MD, Dr. Josephina Shih, who agrees with the above statement and plan.   Subjective:  Chief Complaint: Medication  Management   Interval History:  Patient presents in person for follow-up.  Denies SI/HI/AVH.  Reports continuing to use hemp.  Reports some anxiety and stress related to medical conditions including back pain and dental problems. Reports things have "just been". Denies any acute concerns today. Noticing some significant weight loss from 205 pounds to 176 pounds over the past several months but he also recalls that this was after he was stopped Abilify.  He also states that dental problems may have contributed to this as well.  Patient amenable to not changing any of his prescribed medications at this time.  Visit Diagnosis:    ICD-10-CM   1. MDD (major depressive disorder), recurrent episode, moderate (HCC)  F33.1     2. Psychophysiological insomnia  F51.04          Past Psychiatric History:  Diagnoses: MDD, cannabis use disorder Medication trials: bupropion, abilify Previous psychiatrist/therapist: Dr. 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 Social History   Socioeconomic History   Marital status: Single    Spouse name: Not on file   Number of children: Not on file   Years of education: Not on file  Highest education level: Not on file  Occupational History   Not on file  Tobacco Use   Smoking status: Every Day    Types: Cigarettes   Smokeless tobacco: Never   Tobacco comments:    Patient is now vaping (05/06/2022)  Vaping Use   Vaping status: Every Day   Substances: Nicotine, THC  Substance and Sexual  Activity   Alcohol use: Yes    Comment: occasionally   Drug use: Yes    Types: Marijuana   Sexual activity: Not on file  Other Topics Concern   Not on file  Social History Narrative   Not on file   Social Drivers of Health   Financial Resource Strain: Medium Risk (04/28/2021)   Overall Financial Resource Strain (CARDIA)    Difficulty of Paying Living Expenses: Somewhat hard  Food Insecurity: Medium Risk (02/01/2024)   Received from [facility]   Hunger Vital Sign    Worried About Running Out of Food in the Last Year: Sometimes true    Ran Out of Food in the Last Year: Sometimes true  Transportation Needs: No Transportation Needs (02/01/2024)   Received from [facility]    In the past 12 months, has lack of reliable transportation kept you from medical appointments, meetings, work or from getting things needed for daily living? : No  Physical Activity: Inactive (04/28/2021)   Exercise Vital Sign    Days of Exercise per Week: 0 days    Minutes of Exercise per Session: 0 min  Stress: Stress Concern Present (04/28/2021)   [employer] of Occupational Health - Occupational Stress Questionnaire    Feeling of Stress : To some extent  Social Connections: Socially Isolated (04/28/2021)   Social Connection and Isolation Panel [NHANES]    Frequency of Communication with Friends and Family: More than three times a week    Frequency of Social Gatherings with Friends and Family: Once a week    Attends Religious Services: Never    Database administrator or Organizations: No    Attends Banker Meetings: Never    Marital Status: Never married    Allergies:  Allergies  Allergen Reactions   Vinegar [Acetic Acid] Other (See Comments)    Red Vinegar- flush    Current Medications: Current Outpatient Medications  Medication Sig Dispense Refill   ammonium lactate (AMLACTIN) 12 % cream Apply 1 Application topically daily. 1401 g 1   buPROPion (WELLBUTRIN XL) 150  MG 24 hr tablet Take 1 tablet (150 mg total) by mouth every morning. 90 tablet 0   HYDROcodone-acetaminophen (NORCO) 10-325 MG tablet Take 1 tablet by mouth every 6 (six) hours as needed for pain. 12 tablet 0   ibuprofen (ADVIL) 800 MG tablet Take 800 mg by mouth every 8 (eight) hours as needed.     ibuprofen (IBU) 800 MG tablet Take 1 tablet by mouth once every 8 hours as needed for pain. 20 tablet 1   meloxicam (MOBIC) 7.5 MG tablet Take 1 tablet (7.5 mg total) by mouth daily. 30 tablet 1   meloxicam (MOBIC) 7.5 MG tablet Take 1 tablet (7.5 mg total) by mouth daily. 90 tablet 0   methocarbamol (ROBAXIN) 500 MG tablet Take 1 tablet (500 mg total) by mouth 3 (three) times daily for 10 days 30 tablet 0   penicillin v potassium (VEETID) 500 MG tablet Take 1 tablet (500 mg total) by mouth 4 (four) times daily. 24 tablet 0   propranolol (INDERAL) 10  MG tablet Take 1 tablet (10 mg total) by mouth 2 (two) times daily as needed. 180 tablet 0   traMADol (ULTRAM) 50 MG tablet Take 1 tablet (50 mg total) by mouth every 6 (six) hours as needed for pain 10 tablet 0   traZODone (DESYREL) 50 MG tablet Take 1 tablet (50 mg total) by mouth at bedtime as needed for sleep. 90 tablet 0   No current facility-administered medications for this visit.    ROS: Review of Systems   Objective:  Psychiatric Specialty Exam: Blood pressure 115/79, pulse 74, height 5\' 10"  (1.778 m), weight 176 lb (79.8 kg).Body mass index is 25.25 kg/m.  General Appearance: Fairly Groomed  Eye Contact: Good  Speech:  Clear and Coherent and Normal Rate  Volume:  Normal  Mood:  Anxious and Depressed  Affect:  Congruent and Depressed  Thought Content: Logical   Suicidal Thoughts:  No  Homicidal Thoughts:  No  Thought Process:  Coherent, Goal Directed, and Linear  Orientation:  Full (Time, Place, and Person)    Memory: Remote;   Fair  Judgment:  Fair  Insight:  Fair  Concentration:  Concentration: Fair and Attention Span: Fair   Recall: not formally assessed   Fund of Knowledge: Fair  Language: Fair  Psychomotor Activity:  Normal  Akathisia:  Negative  AIMS (if indicated): not done  Assets:  Communication Skills Desire for Improvement Financial Resources/Insurance Housing Leisure Time Physical Health Resilience Social Support Talents/Skills Vocational/Educational  ADL's:  Intact  Cognition: WNL  Sleep:  Fair   PE: General: well-appearing; no acute distress  Pulm: no increased work of breathing on room air  Strength & Muscle Tone: within normal limits Neuro: no focal neurological deficits observed  Gait & Station: normal  Metabolic Disorder Labs: Lab Results  Component Value Date   HGBA1C 6.1 (H) 04/19/2023   No results found for: "PROLACTIN" Lab Results  Component Value Date   CHOL 148 04/19/2023   TRIG 53 04/19/2023   HDL 51 04/19/2023   CHOLHDL 2.9 04/19/2023   LDLCALC 86 04/19/2023   LDLCALC 106 (H) 12/14/2022   Lab Results  Component Value Date   TSH 1.400 04/19/2023   TSH 1.420 12/14/2022    Therapeutic Level Labs: No results found for: "LITHIUM" No results found for: "VALPROATE" No results found for: "CBMZ"  Screenings: GAD-7    Flowsheet Row Office Visit from 04/19/2023 in [facility] Counselor from 11/10/2022 in [facility] Counselor from 07/07/2022 in [facility] Video Visit from 03/04/2022 in [facility] Counselor from 02/16/2022 in [facility]  Total GAD-7 Score 3 1 9 5 3       PHQ2-9    Flowsheet Row Office Visit from 04/19/2023 in [facility] Counselor from 11/10/2022 in [facility] Counselor from 07/07/2022 in [facility] Video Visit from 03/04/2022 in [facility] Counselor from 02/16/2022 in [facility]  PHQ-2 Total Score 1 0 2 0 0  PHQ-9 Total Score 4 1 4  -- 2      Flowsheet Row Video Visit from 03/04/2022 in [facility] Video Visit from 12/31/2021 in [facility] Video Visit from 11/05/2021 in [facility]  C-SSRS RISK CATEGORY Low Risk Low Risk Moderate Risk       Collaboration of  Care: Collaboration of Care:  Patient/Guardian was advised Release of Information must be obtained prior to any record release in order to collaborate their care with an outside provider. Patient/Guardian was advised if they have not already done so to contact the registration department to sign all necessary forms in order for [place] to release information regarding their care.   Consent: Patient/Guardian gives verbal consent for treatment and assignment of benefits for services provided during this visit. Patient/Guardian expressed understanding and agreed to proceed.   Park Pope, MD 02/02/2024, 4:29 PM

## 2024-02-02 NOTE — Addendum Note (Signed)
 Addended by: Theodoro Kos A on: 02/02/2024 04:36 PM   Modules accepted: Level of Service

## 2024-02-03 ENCOUNTER — Other Ambulatory Visit: Payer: Self-pay

## 2024-02-06 NOTE — Addendum Note (Signed)
 Addended by: Augusta Blizzard B on: 02/06/2024 12:28 PM   Modules accepted: Level of Service

## 2024-02-08 ENCOUNTER — Other Ambulatory Visit: Payer: Self-pay

## 2024-02-16 ENCOUNTER — Ambulatory Visit (HOSPITAL_COMMUNITY): Payer: No Payment, Other | Admitting: Licensed Clinical Social Worker

## 2024-02-16 DIAGNOSIS — F331 Major depressive disorder, recurrent, moderate: Secondary | ICD-10-CM | POA: Diagnosis not present

## 2024-02-16 DIAGNOSIS — F411 Generalized anxiety disorder: Secondary | ICD-10-CM

## 2024-02-16 NOTE — Progress Notes (Signed)
 THERAPIST PROGRESS NOTE  Virtual Visit via Video Note  I connected with Randy Braun on 02/16/24 at 11:00 AM EDT by a video enabled telemedicine application and verified that I am speaking with the correct person using two identifiers.  Location: Patient: [facility]  Provider: [facility] Home    I discussed the limitations of evaluation and management by telemedicine and the availability of in person appointments. The patient expressed understanding and agreed to proceed.  I discussed the assessment and treatment plan with the patient. The patient was provided an opportunity to ask questions and all were answered. The patient agreed with the plan and demonstrated an understanding of the instructions.   The patient was advised to call back or seek an in-person evaluation if the symptoms worsen or if the condition fails to improve as anticipated.  I provided 45 minutes of non-face-to-face time during this encounter.  Maryagnes Small, LCSW   Participation Level: Active  Behavioral Response: CasualAlertAnxious and Depressed  Type of Therapy: Individual Therapy  Treatment Goals addressed:  Active     Bipolar Disorder CCP Problem  1 bipolar disorder depressed      Decrease PHQ-9 below 10  (Progressing)     Start:  12/08/21    Expected End:  04/23/24         Decrease GAD-7 below 5  (Progressing)     Start:  12/08/21    Expected End:  04/23/24         LTG: Stabilize mood and increase goal-directed behavior: Using positive affirmation 1 x daily  (Progressing)     Start:  12/08/21    Expected End:  04/23/24         STG: Randy WILL COOPERATE WITH A PSYCHIATRIC EVALUATION ON 04/28/21 AND DURING ALL FOLLOW-UP VISITS (Completed/Met)     Start:  12/08/21    Expected End:  05/21/22    Resolved:  12/30/21      STG: Randy Braun WILL IDENTIFY COGNITIVE PATTERNS AND BELIEFS THAT INTERFERE WITH THERAPY (Progressing)     Start:  12/08/21    Expected End:  04/23/24          STG: Randy Braun WILL ATTEND AT LEAST 80% OF SCHEDULED FOLLOW-UP MEDICATION MANAGEMENT APPOINTMENTS (Completed/Met)     Start:  12/08/21    Expected End:  11/26/22    Resolved:  09/07/22       Decrease marijuana use to 0/7 days per week  (Progressing)     Start:  06/02/22    Expected End:  04/23/24         WORK WITH Randy Braun TO DISCUSS RISKS AND BENEFITS OF MEDICATION TREATMENT OPTIONS FOR THIS PROBLEM AND PRESCRIBE AS INDICATED (Completed)     Start:  12/08/21    End:  02/16/22      Conduct a review of all medications to evaluate any drug/drug interactions (Completed)     Start:  12/08/21    End:  02/16/22      REVIEW WITH Randy Braun THEIR RESPONSE TO THE PRESCRIBED MEDICATION, INCLUDING ANY SIDE EFFECTS (Completed)     Start:  12/08/21    End:  02/16/22      ENCOURAGE Randy Braun TO KEEP A LOG OF MEDICATION SIDE EFFECTS, QUESTIONS REGARDING MEDICATION, AND SYMPTOMS (Completed)     Start:  12/08/21    End:  02/16/22      INSTRUCT Randy Braun TO TAKE PSYCHOTROPIC MEDICATION AS PRESCRIBED (Completed)     Start:  12/08/21    End:  02/16/22  INSTRUCT Randy Braun TO COMMUNICATE EFFECTS OF PRESCRIBED MEDICATIONS (Completed)     Start:  12/08/21    End:  02/16/22      EDUCATE Randy Braun ON BIPOLAR DISORDER DIAGNOSTIC CRITERIA (Completed)     Start:  12/08/21    End:  02/16/22         ProgressTowards Goals: Progressing  Interventions: CBT, Motivational Interviewing, and Supportive   Suicidal/Homicidal: Nowithout intent/plan  Therapist Response:    Patient was alert and oriented x 5.  He was pleasant, cooperative, maintained good eye contact.  He engaged well in therapy session was dressed casually.  Patient presented with anxious mood\affect.  Patient reports stressors as illness.  He also reports secondary stressors as work.  He reports stressors for illness as having degenerative joint disease after getting an x-ray with his primary care physician.  Patient reports he is  frustrated with the process of follow-up.  He reports that he was not given much information other than to take a leave until his follow-up appointment.  Patient reports tension and worry as he is in pain and feels that interventions need to be started now.  Interventions utilized for this stressor were for supportive therapy for praise and encouragement.  LCSW encouraged patient to advocate for himself.  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  LCSW validated patient's thoughts and feelings in session.  Secondary stressor who patient was for work.  He reports that he is looking into alternative employment to get more money.  Patient states that he is not looking into other: Jobs that pay more down the street.  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  Interventions utilized for the stressor were for setting healthy boundaries and work life balance.  Patient to advocate for himself with roles and responsibilities that he currently has.  LCSW validated feelings in session.  LCSW utilized person Center therapy for empowerment.  LCSW utilized psychoanalytic therapy for patient to express thoughts, feelings and concerns and nonjudgmental environment.  Plan: Return again in 3 weeks.  Diagnosis: Major depressive disorder, recurrent episode, moderate (HCC)  Generalized anxiety disorder  Collaboration of Care: Other None today   Patient/Guardian was advised Release of Information must be obtained prior to any record release in order to collaborate their care with an outside provider. Patient/Guardian was advised if they have not already done so to contact the registration department to sign all necessary forms in order for us  to release information regarding their care.   Consent: Patient/Guardian gives verbal consent for treatment and  assignment of benefits for services provided during this visit. Patient/Guardian expressed understanding and agreed to proceed.   Maryagnes Small, LCSW 02/16/2024

## 2024-02-23 ENCOUNTER — Telehealth (HOSPITAL_COMMUNITY): Payer: Self-pay

## 2024-02-23 NOTE — Telephone Encounter (Signed)
 Medication management - Telephone call with patient, after he had called about his outstanding bill for lab services from 12/14/22, for a cost of $351.75 with [facility]. Informed of this nurse manager's attempts to reach our [facility] representative to have bill taken care of as this should have fallen under out [facility] indigent agreement for 12/14/22 when patient did not have insurance.  Informed Ms. Mario Sicard out of their office until 03/02/24 by an email response this Facilities manager received back. Informed of also sending concerns for this bill to Ms. Wester's boss, Mr. Murray Arnold with [facility] and also leaving him a voice message today too requesting this bill be taken care of as soon as possible because now patient's bill has been turned over to [employer], a collections agency.  Requested Mr. Cheek, or someone over this account with [facility] to call this nurse manager back to discuss and to fix for patient.  Informed patient this nurse manager would keep him posted about this bill and patient agreed with plan. Account # [id number]

## 2024-03-08 ENCOUNTER — Ambulatory Visit (HOSPITAL_COMMUNITY): Admitting: Licensed Clinical Social Worker

## 2024-03-08 DIAGNOSIS — F331 Major depressive disorder, recurrent, moderate: Secondary | ICD-10-CM

## 2024-03-08 DIAGNOSIS — F411 Generalized anxiety disorder: Secondary | ICD-10-CM
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
## 2024-03-29 ENCOUNTER — Ambulatory Visit (HOSPITAL_COMMUNITY): Admitting: Licensed Clinical Social Worker

## 2024-03-29 DIAGNOSIS — F331 Major depressive disorder, recurrent, moderate: Secondary | ICD-10-CM

## 2024-03-29 DIAGNOSIS — F411 Generalized anxiety disorder: Secondary | ICD-10-CM | POA: Diagnosis not present

## 2024-03-29 NOTE — Progress Notes (Unsigned)
 THERAPIST PROGRESS NOTE  Virtual Visit via Video Note  I connected with Randy Braun on 03/29/24 at  4:00 PM EDT by a video enabled telemedicine application and verified that I am speaking with the correct person using two identifiers.  Location: Patient: [facility]  Provider: [facility] Home    I discussed the limitations of evaluation and management by telemedicine and the availability of in person appointments. The patient expressed understanding and agreed to proceed.  I discussed the assessment and treatment plan with the patient. The patient was provided an opportunity to ask questions and all were answered. The patient agreed with the plan and demonstrated an understanding of the instructions.   The patient was advised to call back or seek an in-person evaluation if the symptoms worsen or if the condition fails to improve as anticipated.  I provided 45 minutes of non-face-to-face time during this encounter.   Maryagnes Small, LCSW   Participation Level: Active  Behavioral Response: CasualAlertAnxious and Depressed  Type of Therapy: Individual Therapy  Treatment Goals addressed:  Active     Bipolar Disorder CCP Problem  1 bipolar disorder depressed      Decrease PHQ-9 below 10  (Progressing)     Start:  12/08/21    Expected End:  04/23/24         Decrease GAD-7 below 5  (Progressing)     Start:  12/08/21    Expected End:  04/23/24         LTG: Stabilize mood and increase goal-directed behavior: Using positive affirmation 1 x daily  (Progressing)     Start:  12/08/21    Expected End:  04/23/24         STG: Randy Braun WILL COOPERATE WITH A PSYCHIATRIC EVALUATION ON 04/28/21 AND DURING ALL FOLLOW-UP VISITS (Completed/Met)     Start:  12/08/21    Expected End:  05/21/22    Resolved:  12/30/21      STG: Randy Braun WILL IDENTIFY COGNITIVE PATTERNS AND BELIEFS THAT INTERFERE WITH THERAPY (Progressing)     Start:  12/08/21    Expected End:  04/23/24          STG: Randy Braun WILL ATTEND AT LEAST 80% OF SCHEDULED FOLLOW-UP MEDICATION MANAGEMENT APPOINTMENTS (Completed/Met)     Start:  12/08/21    Expected End:  11/26/22    Resolved:  09/07/22       Decrease marijuana use to 0/7 days per week  (Progressing)     Start:  06/02/22    Expected End:  04/23/24         WORK WITH Randy Braun TO DISCUSS RISKS AND BENEFITS OF MEDICATION TREATMENT OPTIONS FOR THIS PROBLEM AND PRESCRIBE AS INDICATED (Completed)     Start:  12/08/21    End:  02/16/22      Conduct a review of all medications to evaluate any drug/drug interactions (Completed)     Start:  12/08/21    End:  02/16/22      REVIEW WITH Randy Braun THEIR RESPONSE TO THE PRESCRIBED MEDICATION, INCLUDING ANY SIDE EFFECTS (Completed)     Start:  12/08/21    End:  02/16/22      ENCOURAGE Randy Braun TO KEEP A LOG OF MEDICATION SIDE EFFECTS, QUESTIONS REGARDING MEDICATION, AND SYMPTOMS (Completed)     Start:  12/08/21    End:  02/16/22      INSTRUCT Randy Braun TO TAKE PSYCHOTROPIC MEDICATION AS PRESCRIBED (Completed)     Start:  12/08/21    End:  02/16/22  INSTRUCT Randy Braun TO COMMUNICATE EFFECTS OF PRESCRIBED MEDICATIONS (Completed)     Start:  12/08/21    End:  02/16/22      EDUCATE Randy Braun ON BIPOLAR DISORDER DIAGNOSTIC CRITERIA (Completed)     Start:  12/08/21    End:  02/16/22         ProgressTowards Goals: Progressing  Interventions: CBT, Motivational Interviewing, and Supportive  Summary: Randy Braun is a 38 y.o. male who presents with   Suicidal/Homicidal: Nowithout intent/plan  Therapist Response:     Intervention/Plan:    Plan: Return again in 4 weeks.  Diagnosis: Generalized anxiety disorder  Major depressive disorder, recurrent episode, moderate (HCC)  Collaboration of Care: Other None today   Patient/Guardian was advised Release of Information must be obtained prior to any record release in order to collaborate their care with an outside provider.  Patient/Guardian was advised if they have not already done so to contact the registration department to sign all necessary forms in order for us  to release information regarding their care.   Consent: Patient/Guardian gives verbal consent for treatment and assignment of benefits for services provided during this visit. Patient/Guardian expressed understanding and agreed to proceed.   Maryagnes Small, LCSW 03/29/2024

## 2024-04-12 ENCOUNTER — Encounter (HOSPITAL_COMMUNITY): Admitting: Student

## 2024-04-12 NOTE — Progress Notes (Cosign Needed Addendum)
 BH MD Outpatient Progress Note  04/13/2024 11:46 AM Randy Braun  MRN:  [id number]  Assessment:  [name] presents for follow-up evaluation in-person. 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 Reports symptoms of MDD and GAD are stable. He expresses concern regarding some inattention and memory problems that appear most consistent with poor sleep and THC use. Provided patient with article that evaluated the relationship between [facility] use, sleep, and memory problems. Also encouraged patient to use CBT-I coach app to enhance sleep hygiene and sleep efficiency. There is consideration that memory problem is a component of patient's MDD but given no other exacerbation in other aspects of MDD, it is likely secondary to patient's chronic THC use. He has been taking medications as prescribed and uses propranolol  as needed for social anxiety and when he becomes irritable. No psychotropic adjustments were made to medications today.  Patient will follow-up in 2 to 3 months with Dr. Mercy and is aware of transition of care.  He continues to see Adam Goldammer for psychotherapy.  Identifying Information: Randy Braun is a 38 y.o. male with a history of MDD, anxiety, and insomnia who is an established patient with [facility] for medication management.   Plan:  # MDD, recurrent, moderate Past medication trials: Abilify  Status of problem: active Interventions: -- continue bupropion  XL 150 mg daily -- continue trazodone  50 mg at bedtime -- continue psychotherapy  # Cannabis Use Past medication trials:  Interventions: -- recommend cessation  # Generalized Anxiety Disorder Past medication trials:  Status of problem:  active Interventions: -- Continue propranolol  10 mg bid prn   Return to care in 8 weeks  Patient was given contact information for behavioral health clinic and was instructed to call 911 for emergencies.    Patient and plan of care will be discussed with the Attending MD who agrees with the above statement and plan.   Subjective:  Chief Complaint: Medication Management   Interval History:  Patient presents in person for follow-up.  Denies SI/HI/AVH.  Reports continuing to vape [facility] and did smoke flower once a week ago. 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 Denies any acute concerns today. Reports propranolol  has been helpful with recent irritability from financial stressors. Patient amenable to not changing any of his prescribed medications at this time.  Visit Diagnosis:    ICD-10-CM   1. MDD (major depressive disorder), recurrent episode, moderate (HCC)  F33.1 buPROPion  (WELLBUTRIN  XL) 150 MG 24 hr tablet    propranolol  (INDERAL ) 10 MG tablet    traZODone  (DESYREL ) 50 MG tablet    2. Psychophysiological insomnia  F51.04 traZODone  (DESYREL ) 50 MG tablet          Past Psychiatric History:  Diagnoses: MDD, cannabis use disorder Medication trials: bupropion , abilify  Previous psychiatrist/therapist: Dr. 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 Social History   Socioeconomic History   Marital status: Single    Spouse name: Not on  file   Number of children: Not on file   Years of education: Not on file   Highest education level: Not on file  Occupational History   Not on file  Tobacco Use   Smoking status: Every Day    Types: Cigarettes   Smokeless tobacco: Never   Tobacco comments:    Patient is now vaping (05/06/2022)  Vaping Use   Vaping status: Every Day   Substances: Nicotine, THC  Substance and Sexual Activity   Alcohol use: Yes    Comment: occasionally   Drug use: Yes    Types: Marijuana   Sexual activity: Not on file  Other Topics Concern   Not on file  Social History Narrative   Not on file   Social Drivers of Health   Financial Resource Strain: Medium Risk (04/28/2021)   Overall Financial Resource Strain (CARDIA)    Difficulty of Paying Living Expenses: Somewhat hard  Food Insecurity: Medium Risk (02/01/2024)   Received from [facility]   Hunger Vital Sign    Within the past 12 months, you worried that your food would run out before you got money to buy more: Sometimes true    Within the past 12 months, the food you bought just didn't last and you didn't have money to get more. : Sometimes true  Transportation Needs: No Transportation Needs (02/01/2024)   Received from [facility]    In the past 12 months, has lack of reliable transportation kept you from medical appointments, meetings, work or from getting things needed for daily living? : No  Physical Activity: Inactive (04/28/2021)   Exercise Vital Sign    Days of Exercise per Week: 0 days    Minutes of Exercise per Session: 0 min  Stress: Stress Concern Present (04/28/2021)   [employer] of Occupational Health - Occupational Stress Questionnaire    Feeling of Stress : To some extent  Social Connections: Socially Isolated (04/28/2021)   Social Connection and Isolation Panel    Frequency of Communication with Friends and Family: More than three times a week    Frequency of Social Gatherings with Friends and Family:  Once a week    Attends Religious Services: Never    Database administrator or Organizations: No    Attends Banker Meetings: Never    Marital Status: Never married    Allergies:  Allergies  Allergen Reactions   Vinegar [Acetic Acid] Other (See Comments)    Red Vinegar- flush    Current Medications: Current Outpatient Medications  Medication Sig Dispense Refill   ammonium lactate  (AMLACTIN) 12 % cream Apply 1 Application topically daily. 1401 g 1   buPROPion  (WELLBUTRIN  XL) 150 MG 24 hr tablet Take 1 tablet (150 mg total) by mouth every morning. 90 tablet 0   HYDROcodone -acetaminophen  (NORCO) 10-325 MG tablet Take 1 tablet by mouth every 6 (six) hours as needed for pain. 12 tablet 0   ibuprofen  (ADVIL ) 800 MG tablet Take 800 mg by mouth every 8 (eight) hours as needed.     ibuprofen  (IBU) 800 MG tablet Take 1 tablet by mouth once every 8 hours as needed for pain. 20 tablet 1   meloxicam  (MOBIC ) 7.5 MG tablet Take 1 tablet (7.5 mg total) by  mouth daily. 30 tablet 1   meloxicam  (MOBIC ) 7.5 MG tablet Take 1 tablet (7.5 mg total) by mouth daily. 90 tablet 0   methocarbamol  (ROBAXIN ) 500 MG tablet Take 1 tablet (500 mg total) by mouth 3 (three) times daily for 10 days 30 tablet 0   penicillin  v potassium (VEETID) 500 MG tablet Take 1 tablet (500 mg total) by mouth 4 (four) times daily. 24 tablet 0   propranolol  (INDERAL ) 10 MG tablet Take 1 tablet (10 mg total) by mouth 2 (two) times daily as needed. 180 tablet 0   traMADol  (ULTRAM ) 50 MG tablet Take 1 tablet (50 mg total) by mouth every 6 (six) hours as needed for pain 10 tablet 0   traZODone  (DESYREL ) 50 MG tablet Take 1 tablet (50 mg total) by mouth at bedtime as needed for sleep. 90 tablet 0   No current facility-administered medications for this visit.    ROS: Review of Systems   Objective:  Psychiatric Specialty Exam: There were no vitals taken for this visit.There is no height or weight on file to calculate BMI.   General Appearance: Fairly Groomed  Eye Contact: Good  Speech:  Clear and Coherent and Normal Rate  Volume:  Normal  Mood:  Anxious and Depressed  Affect:  Congruent and Depressed  Thought Content: Logical   Suicidal Thoughts:  No  Homicidal Thoughts:  No  Thought Process:  Coherent, Goal Directed, and Linear  Orientation:  Full (Time, Place, and Person)    Memory: Remote;   Fair  Judgment:  Fair  Insight:  Fair  Concentration:  Concentration: Fair and Attention Span: Fair  Recall: not formally assessed   Fund of Knowledge: Fair  Language: Fair  Psychomotor Activity:  Normal  Akathisia:  Negative  AIMS (if indicated): not done  Assets:  Communication Skills Desire for Improvement Financial Resources/Insurance Housing Leisure Time Physical Health Resilience Social Support Talents/Skills Vocational/Educational  ADL's:  Intact  Cognition: WNL  Sleep:  Fair   PE: General: well-appearing; no acute distress  Pulm: no increased work of breathing on room air  Strength & Muscle Tone: within normal limits Neuro: no focal neurological deficits observed  Gait & Station: normal  Metabolic Disorder Labs: Lab Results  Component Value Date   HGBA1C 6.1 (H) 04/19/2023   No results found for: PROLACTIN Lab Results  Component Value Date   CHOL 148 04/19/2023   TRIG 53 04/19/2023   HDL 51 04/19/2023   CHOLHDL 2.9 04/19/2023   LDLCALC 86 04/19/2023   LDLCALC 106 (H) 12/14/2022   Lab Results  Component Value Date   TSH 1.400 04/19/2023   TSH 1.420 12/14/2022    Therapeutic Level Labs: No results found for: LITHIUM No results found for: VALPROATE No results found for: CBMZ  Screenings: GAD-7    Flowsheet Row Office Visit from 04/19/2023 in [facility] Counselor from 11/10/2022 in [facility] Counselor from 07/07/2022 in [facility] Video Visit from 03/04/2022 in [facility] Counselor from 02/16/2022 in [facility]  Total GAD-7 Score 3 1 9 5 3    PHQ2-9    Flowsheet Row Office Visit from 04/19/2023 in [facility] Counselor from 11/10/2022 in [facility] Counselor from 07/07/2022 in [facility] Video Visit from 03/04/2022 in [facility] Counselor from 02/16/2022 in [facility]  PHQ-2 Total Score 1 0 2 0 0  PHQ-9 Total Score 4 1 4  -- 2   Flowsheet Row Video Visit from 03/04/2022 in [facility] Video Visit from 12/31/2021 in [facility] Video Visit from 11/05/2021 in [facility]  C-SSRS RISK CATEGORY Low Risk Low Risk Moderate Risk    Collaboration of Care: Collaboration of Care:  Patient/Guardian was advised Release of Information must be obtained prior to any record release in order to collaborate their care with an outside provider. Patient/Guardian was advised if they have not already done so to contact the registration department to sign all necessary forms in order for us  to release information regarding their care.   Consent: Patient/Guardian gives verbal consent for treatment and assignment of benefits for services provided during this visit. Patient/Guardian expressed understanding and agreed to proceed.   Prentice Espy, MD 04/13/2024, 11:46 AM

## 2024-04-13 ENCOUNTER — Other Ambulatory Visit: Payer: Self-pay

## 2024-04-13 ENCOUNTER — Ambulatory Visit (HOSPITAL_COMMUNITY): Admitting: Student

## 2024-04-13 DIAGNOSIS — F331 Major depressive disorder, recurrent, moderate: Secondary | ICD-10-CM | POA: Diagnosis not present

## 2024-04-13 DIAGNOSIS — F5104 Psychophysiologic insomnia: Secondary | ICD-10-CM

## 2024-04-13 MED ORDER — PROPRANOLOL HCL 10 MG PO TABS
10.0000 mg | ORAL_TABLET | Freq: Two times a day (BID) | ORAL | 0 refills | Status: DC | PRN
  Filled 2024-04-13: qty 180, 90d supply, fill #0

## 2024-04-13 MED ORDER — TRAZODONE HCL 50 MG PO TABS
50.0000 mg | ORAL_TABLET | Freq: Every evening | ORAL | 0 refills | Status: DC | PRN
  Filled 2024-04-13: qty 90, 90d supply, fill #0

## 2024-04-13 MED ORDER — BUPROPION HCL ER (XL) 150 MG PO TB24
150.0000 mg | ORAL_TABLET | ORAL | 0 refills | Status: DC
  Filled 2024-04-13 – 2024-04-20 (×3): qty 90, 90d supply, fill #0

## 2024-04-13 NOTE — Patient Instructions (Signed)
 Look into CBTi Coach.

## 2024-04-18 ENCOUNTER — Other Ambulatory Visit: Payer: Self-pay

## 2024-04-19 ENCOUNTER — Other Ambulatory Visit: Payer: Self-pay

## 2024-04-19 ENCOUNTER — Ambulatory Visit (INDEPENDENT_AMBULATORY_CARE_PROVIDER_SITE_OTHER): Admitting: Licensed Clinical Social Worker

## 2024-04-19 DIAGNOSIS — F331 Major depressive disorder, recurrent, moderate: Secondary | ICD-10-CM | POA: Diagnosis not present

## 2024-04-19 NOTE — Progress Notes (Signed)
 THERAPIST PROGRESS NOTE  Virtual Visit via Video Note  I connected with Randy Braun on 04/19/24 at 10:00 AM EDT by a video enabled telemedicine application and verified that I am speaking with the correct person using two identifiers.  Location: Patient: [facility]  Provider: Providers Home    I discussed the limitations of evaluation and management by telemedicine and the availability of in person appointments. The patient expressed understanding and agreed to proceed.     I discussed the assessment and treatment plan with the patient. The patient was provided an opportunity to ask questions and all were answered. The patient agreed with the plan and demonstrated an understanding of the instructions.   The patient was advised to call back or seek an in-person evaluation if the symptoms worsen or if the condition fails to improve as anticipated.  I provided 45 minutes of non-face-to-face time during this encounter.   Juliene GORMAN Patee, LCSW   Participation Level: Active  Behavioral Response: CasualAlertAnxious and Depressed  Type of Therapy: Individual Therapy  Treatment Goals addressed:  Active     Bipolar Disorder CCP Problem  1 bipolar disorder depressed      Decrease PHQ-9 below 10  (Progressing)     Start:  12/08/21    Expected End:  04/23/24         Decrease GAD-7 below 5  (Progressing)     Start:  12/08/21    Expected End:  04/23/24         LTG: Stabilize mood and increase goal-directed behavior: Using positive affirmation 1 x daily  (Progressing)     Start:  12/08/21    Expected End:  04/23/24         STG: Randy Braun WILL COOPERATE WITH A PSYCHIATRIC EVALUATION ON 04/28/21 AND DURING ALL FOLLOW-UP VISITS (Completed/Met)     Start:  12/08/21    Expected End:  05/21/22    Resolved:  12/30/21      STG: [name]     Start:  12/08/21    Expected End:  04/23/24          STG: Randy Braun WILL ATTEND AT LEAST 80% OF SCHEDULED FOLLOW-UP MEDICATION MANAGEMENT APPOINTMENTS (Completed/Met)     Start:  12/08/21    Expected End:  11/26/22    Resolved:  09/07/22       Decrease marijuana use to 0/7 days per week  (Progressing)     Start:  06/02/22    Expected End:  04/23/24         WORK WITH Randy Braun TO DISCUSS RISKS AND BENEFITS OF MEDICATION TREATMENT OPTIONS FOR THIS PROBLEM AND PRESCRIBE AS INDICATED (Completed)     Start:  12/08/21    End:  02/16/22      Conduct a review of all medications to evaluate any drug/drug interactions (Completed)     Start:  12/08/21    End:  02/16/22      REVIEW WITH Randy Braun THEIR RESPONSE TO THE PRESCRIBED MEDICATION, INCLUDING ANY SIDE EFFECTS (Completed)     Start:  12/08/21    End:  02/16/22      ENCOURAGE Randy Braun TO KEEP A LOG OF MEDICATION SIDE EFFECTS, QUESTIONS REGARDING MEDICATION, AND SYMPTOMS (Completed)     Start:  12/08/21    End:  02/16/22      INSTRUCT Randy Braun TO TAKE PSYCHOTROPIC MEDICATION AS PRESCRIBED (Completed)     Start:  12/08/21    End:  02/16/22  INSTRUCT Randy Braun TO COMMUNICATE EFFECTS OF PRESCRIBED MEDICATIONS (Completed)     Start:  12/08/21    End:  02/16/22      EDUCATE Randy Braun ON BIPOLAR DISORDER DIAGNOSTIC CRITERIA (Completed)     Start:  12/08/21    End:  02/16/22         ProgressTowards Goals: Progressing  Interventions: CBT, Motivational Interviewing, and Supportive    Suicidal/Homicidal: Nowithout intent/plan  Therapist Response:     Patient was alert and oriented x 5.  He was pleasant, cooperative, maintained good eye contact.  Engaged well in therapy session and was dressed casually. Randy Braun presented with angry and irritable mood/affect.    Orlin comes in with primary stressor as rumination.  Patient has been ruminating on angry thoughts.  He reports that his anger relates into thoughts of outbursts.  Patient states that he is always having bad  things happen to him as evidenced by being charged extra for rent this past month due to a clerk here.  Patient also reports that he was struggling getting the phones from his graduation program that took money out of his check at very 2 weeks and would set it aside once patient achieved his homework he got the money.  Patient reports that there was little leg in the money and that the process was congruent for him.   Intervention/plan: LCSW utilized cognitive behavioral therapy to educate patient on cognitive distortions such as dismissing the positive and catastrophizing.  LCSW utilize DBT to speak with patient about distress tolerance such as radical rationalization and ACCEPTS. ACCEPTS: Action, contributing, comparison, emotions, pushing away, thoughts, and sensation.  LCSW utilized psychoanalytic therapy for patient to express thoughts, feelings and emotions and nonjudgmental environment.  LCSW utilized reframing in session.  LCSW utilized supportive therapy for praise and encouragement.  Plan: Return again in 3 weeks.  Diagnosis: Major depressive disorder, recurrent episode, moderate (HCC)  Collaboration of Care: Other None today   Patient/Guardian was advised Release of Information must be obtained prior to any record release in order to collaborate their care with an outside provider. Patient/Guardian was advised if they have not already done so to contact the registration department to sign all necessary forms in order for us  to release information regarding their care.   Consent: Patient/Guardian gives verbal consent for treatment and assignment of benefits for services provided during this visit. Patient/Guardian expressed understanding and agreed to proceed.   Juliene GORMAN Patee, LCSW 04/19/2024

## 2024-04-20 ENCOUNTER — Other Ambulatory Visit: Payer: Self-pay

## 2024-04-20 ENCOUNTER — Other Ambulatory Visit (HOSPITAL_COMMUNITY): Payer: Self-pay

## 2024-04-25 ENCOUNTER — Other Ambulatory Visit: Payer: Self-pay

## 2024-05-03 ENCOUNTER — Other Ambulatory Visit: Payer: Self-pay

## 2024-05-03 MED ORDER — METHOCARBAMOL 500 MG PO TABS
500.0000 mg | ORAL_TABLET | Freq: Three times a day (TID) | ORAL | 0 refills | Status: DC
  Filled 2024-05-03: qty 30, 10d supply, fill #0

## 2024-05-07 ENCOUNTER — Other Ambulatory Visit: Payer: Self-pay

## 2024-05-07 MED ORDER — FERROUS SULFATE 325 (65 FE) MG PO TABS
325.0000 mg | ORAL_TABLET | Freq: Every day | ORAL | 1 refills | Status: AC
  Filled 2024-05-07: qty 30, 30d supply, fill #0
  Filled 2024-06-11: qty 30, 30d supply, fill #1
  Filled 2024-07-10 (×2): qty 30, 30d supply, fill #2
  Filled 2024-08-16: qty 30, 30d supply, fill #3
  Filled 2024-08-17: qty 30, 30d supply, fill #0
  Filled 2024-09-21: qty 30, 30d supply, fill #1
  Filled 2024-10-26: qty 30, 30d supply, fill #2

## 2024-05-17 ENCOUNTER — Ambulatory Visit (HOSPITAL_COMMUNITY): Admitting: Licensed Clinical Social Worker

## 2024-05-22 ENCOUNTER — Ambulatory Visit (INDEPENDENT_AMBULATORY_CARE_PROVIDER_SITE_OTHER): Admitting: Licensed Clinical Social Worker

## 2024-05-22 DIAGNOSIS — F331 Major depressive disorder, recurrent, moderate: Secondary | ICD-10-CM | POA: Diagnosis not present

## 2024-05-22 DIAGNOSIS — F411 Generalized anxiety disorder: Secondary | ICD-10-CM

## 2024-05-22 DIAGNOSIS — F99 Mental disorder, not otherwise specified: Secondary | ICD-10-CM | POA: Diagnosis not present

## 2024-05-22 DIAGNOSIS — F5105 Insomnia due to other mental disorder: Secondary | ICD-10-CM

## 2024-05-22 NOTE — Progress Notes (Signed)
 THERAPIST PROGRESS NOTE  Session Time: 26  Participation Level: Active  Behavioral Response: CasualAlertAnxious, Dysphoric, Hopeless, and Worthless  Type of Therapy: Individual Therapy  Treatment Goals addressed:  Active     Bipolar Disorder CCP Problem  1 bipolar disorder depressed      Decrease PHQ-9 below 10  (Progressing)     Start:  12/08/21    Expected End:  10/26/24         Decrease GAD-7 below 5  (Progressing)     Start:  12/08/21    Expected End:  10/26/24         LTG: Stabilize mood and increase goal-directed behavior: Using positive affirmation 1 x daily  (Progressing)     Start:  12/08/21    Expected End:  10/26/24         STG: Ziggy WILL COOPERATE WITH A PSYCHIATRIC EVALUATION ON 04/28/21 AND DURING ALL FOLLOW-UP VISITS (Completed/Met)     Start:  12/08/21    Expected End:  05/21/22    Resolved:  12/30/21      STG: [name]     Start:  12/08/21    Expected End:  10/26/24         STG: Oneil WILL ATTEND AT LEAST 80% OF SCHEDULED FOLLOW-UP MEDICATION MANAGEMENT APPOINTMENTS (Completed/Met)     Start:  12/08/21    Expected End:  11/26/22    Resolved:  09/07/22       Decrease marijuana use to 0/7 days per week  (Progressing)     Start:  06/02/22    Expected End:  10/26/24         WORK WITH Terrace TO DISCUSS RISKS AND BENEFITS OF MEDICATION TREATMENT OPTIONS FOR THIS PROBLEM AND PRESCRIBE AS INDICATED (Completed)     Start:  12/08/21    End:  02/16/22      Conduct a review of all medications to evaluate any drug/drug interactions (Completed)     Start:  12/08/21    End:  02/16/22      REVIEW WITH Tyreece THEIR RESPONSE TO THE PRESCRIBED MEDICATION, INCLUDING ANY SIDE EFFECTS (Completed)     Start:  12/08/21    End:  02/16/22      ENCOURAGE Matyas TO KEEP A LOG OF MEDICATION SIDE EFFECTS, QUESTIONS REGARDING MEDICATION, AND SYMPTOMS (Completed)      Start:  12/08/21    End:  02/16/22      INSTRUCT Sohail TO TAKE PSYCHOTROPIC MEDICATION AS PRESCRIBED (Completed)     Start:  12/08/21    End:  02/16/22      INSTRUCT Roy TO COMMUNICATE EFFECTS OF PRESCRIBED MEDICATIONS (Completed)     Start:  12/08/21    End:  02/16/22      EDUCATE Bricen ON BIPOLAR DISORDER DIAGNOSTIC CRITERIA (Completed)     Start:  12/08/21    End:  02/16/22         ProgressTowards Goals: Progressing  Interventions: CBT, Motivational Interviewing, and Supportive   Suicidal/Homicidal: Nowithout intent/plan  Therapist Response:    Intervention/Plan:Patient was alert and oriented x 5.  He was pleasant, cooperative, maintained good eye contact.  He engaged well in therapy session and was dressed casually.  Patient comes in today stating I am a little hung over.  He reports that he had the IPA.  He reports drinking 3 12-ounce cans and felt that this morning when he woke up.  Patient reports that he was drinking  due to the grief and loss of his cousin's son.  He reports that he was utilizing the time calling his family as his version of the wake.  This is because the funeral was going to be in [place] .    Other stressors for patient are a high.  He reports that he spent 3 days talking to multiple different AI apps trying to gain information from them.  Patient reports that this was because he was not talking to his friends because they annoyed him.  Patient reports struggling concentrating between wanting to read and learn, tach, crypto, and AI.  Intervention/Plan: LCSW utilized psychoanalytic therapy for patient to express thoughts LCSW utilized motivational interviewing for reflective listening and open-ended questions.  LCSW educated patient on addiction due to his AI binging.  LCSW educated patient on the use of alcohol while taking mental health medications and how it can have adverse effects.  Plan: Return again in 8 weeks.  Diagnosis:  Major depressive disorder, recurrent episode, moderate (HCC)  Generalized anxiety disorder  Insomnia due to other mental disorder  Collaboration of Care: Other Continued individual therapy   Patient/Guardian was advised Release of Information must be obtained prior to any record release in order to collaborate their care with an outside provider. Patient/Guardian was advised if they have not already done so to contact the registration department to sign all necessary forms in order for us  to release information regarding their care.   Consent: Patient/Guardian gives verbal consent for treatment and assignment of benefits for services provided during this visit. Patient/Guardian expressed understanding and agreed to proceed.   Juliene GORMAN Patee, LCSW 05/22/2024

## 2024-06-11 ENCOUNTER — Other Ambulatory Visit (HOSPITAL_COMMUNITY): Payer: Self-pay

## 2024-06-11 ENCOUNTER — Other Ambulatory Visit: Payer: Self-pay

## 2024-06-12 ENCOUNTER — Other Ambulatory Visit: Payer: Self-pay

## 2024-06-26 ENCOUNTER — Encounter (HOSPITAL_COMMUNITY): Admitting: Psychiatry
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
## 2024-07-03 ENCOUNTER — Other Ambulatory Visit: Payer: Self-pay

## 2024-07-03 ENCOUNTER — Ambulatory Visit (INDEPENDENT_AMBULATORY_CARE_PROVIDER_SITE_OTHER): Admitting: Psychiatry

## 2024-07-03 ENCOUNTER — Encounter (HOSPITAL_COMMUNITY): Payer: Self-pay | Admitting: Psychiatry

## 2024-07-03 VITALS — BP 133/79 | HR 68 | Ht 70.0 in | Wt 175.4 lb

## 2024-07-03 DIAGNOSIS — F5104 Psychophysiologic insomnia: Secondary | ICD-10-CM

## 2024-07-03 DIAGNOSIS — F411 Generalized anxiety disorder: Secondary | ICD-10-CM | POA: Diagnosis not present

## 2024-07-03 DIAGNOSIS — F331 Major depressive disorder, recurrent, moderate: Secondary | ICD-10-CM | POA: Diagnosis not present

## 2024-07-03 MED ORDER — BUPROPION HCL ER (XL) 150 MG PO TB24
150.0000 mg | ORAL_TABLET | ORAL | 0 refills | Status: DC
  Filled 2024-07-03 – 2024-07-16 (×2): qty 90, 90d supply, fill #0

## 2024-07-03 MED ORDER — TRAZODONE HCL 50 MG PO TABS
50.0000 mg | ORAL_TABLET | Freq: Every evening | ORAL | 0 refills | Status: DC | PRN
  Filled 2024-07-03 (×2): qty 90, 90d supply, fill #0

## 2024-07-03 MED ORDER — MELATONIN 3 MG PO TABS: 3.0000 mg | ORAL_TABLET | Freq: Every day | ORAL | Status: DC

## 2024-07-03 NOTE — Patient Instructions (Signed)
 Thank you for attending your appointment today.  -- START melatonin 1-3 mg nightly to be taken 3 hours prior to desired bedtime -- Continue other medications as prescribed.  Please do not make any changes to medications without first discussing with your provider. If you are experiencing a psychiatric emergency, please call 911 or present to your nearest emergency department. Additional crisis, medication management, and therapy resources are included below.  [facility]  [address] [place], [place] [address] [phone] WALK-IN URGENT CARE 24/7 FOR ANYONE [address] [place], [place]  [phone] Fax: [phone] [email or web address] *Interpreters available *Accepts all insurance and uninsured for Urgent Care needs *Accepts Medicaid and uninsured for outpatient treatment (below)      ONLY FOR [facility]  Below:    Outpatient New Patient Assessment/Therapy Walk-ins:        Monday, Wednesday, and Thursday 8am until slots are full (first come, first served)                   New Patient Psychiatry/Medication Management        Monday-Friday 8am-11am (first come, first served)               For all walk-ins we ask that you arrive by 7:15am, because patients will be seen in the order of arrival.

## 2024-07-06 ENCOUNTER — Other Ambulatory Visit: Payer: Self-pay

## 2024-07-10 ENCOUNTER — Other Ambulatory Visit: Payer: Self-pay

## 2024-07-16 ENCOUNTER — Other Ambulatory Visit: Payer: Self-pay

## 2024-07-17 ENCOUNTER — Other Ambulatory Visit: Payer: Self-pay

## 2024-07-25 ENCOUNTER — Ambulatory Visit (HOSPITAL_COMMUNITY): Admitting: Licensed Clinical Social Worker

## 2024-08-07 ENCOUNTER — Ambulatory Visit (INDEPENDENT_AMBULATORY_CARE_PROVIDER_SITE_OTHER): Admitting: Licensed Clinical Social Worker

## 2024-08-07 DIAGNOSIS — F331 Major depressive disorder, recurrent, moderate: Secondary | ICD-10-CM

## 2024-08-07 DIAGNOSIS — F411 Generalized anxiety disorder: Secondary | ICD-10-CM

## 2024-08-07 NOTE — Progress Notes (Signed)
 THERAPIST PROGRESS NOTE  Session Time: 13   Participation Level: Active  Behavioral Response: CasualAlertAnxious  Type of Therapy: Individual Therapy  Treatment Goals addressed:  Active     Bipolar Disorder CCP Problem  1 bipolar disorder depressed      Decrease PHQ-9 below 10  (Progressing)     Start:  12/08/21    Expected End:  10/26/24         Decrease GAD-7 below 5  (Progressing)     Start:  12/08/21    Expected End:  10/26/24         LTG: Stabilize mood and increase goal-directed behavior: Using positive affirmation 1 x daily  (Progressing)     Start:  12/08/21    Expected End:  10/26/24         STG: Ilia WILL COOPERATE WITH A PSYCHIATRIC EVALUATION ON 04/28/21 AND DURING ALL FOLLOW-UP VISITS (Completed/Met)     Start:  12/08/21    Expected End:  05/21/22    Resolved:  12/30/21      STG: [name]     Start:  12/08/21    Expected End:  10/26/24         STG: Brayden WILL ATTEND AT LEAST 80% OF SCHEDULED FOLLOW-UP MEDICATION MANAGEMENT APPOINTMENTS (Completed/Met)     Start:  12/08/21    Expected End:  11/26/22    Resolved:  09/07/22       Decrease marijuana use to 0/7 days per week  (Progressing)     Start:  06/02/22    Expected End:  10/26/24         WORK WITH Dale TO DISCUSS RISKS AND BENEFITS OF MEDICATION TREATMENT OPTIONS FOR THIS PROBLEM AND PRESCRIBE AS INDICATED (Completed)     Start:  12/08/21    End:  02/16/22      Conduct a review of all medications to evaluate any drug/drug interactions (Completed)     Start:  12/08/21    End:  02/16/22      REVIEW WITH Jemario THEIR RESPONSE TO THE PRESCRIBED MEDICATION, INCLUDING ANY SIDE EFFECTS (Completed)     Start:  12/08/21    End:  02/16/22      ENCOURAGE Loden TO KEEP A LOG OF MEDICATION SIDE EFFECTS, QUESTIONS REGARDING MEDICATION, AND SYMPTOMS (Completed)     Start:  12/08/21    End:   02/16/22      INSTRUCT Otoniel TO TAKE PSYCHOTROPIC MEDICATION AS PRESCRIBED (Completed)     Start:  12/08/21    End:  02/16/22      INSTRUCT Baer TO COMMUNICATE EFFECTS OF PRESCRIBED MEDICATIONS (Completed)     Start:  12/08/21    End:  02/16/22      EDUCATE Artavis ON BIPOLAR DISORDER DIAGNOSTIC CRITERIA (Completed)     Start:  12/08/21    End:  02/16/22         ProgressTowards Goals: Progressing  Interventions: CBT, Motivational Interviewing, and Supportive  Summary: Durk Virden is a 38 y.o. male who presents with euphoric mood\affect.  Patient was pleasant, cooperative, maintained good eye contact.  He engaged well in therapy session was dressed casually.  Patient reports primary stressors as his current political climate.  He also reports stressors for work.  Patient would like to engage in more activities to set him up for future income such as getting certifications and AI and cyber security.  Patient reports that he would also like to  look into moving out of the country but reports there have been some barriers such as visas and his current income.  Patient reports that in recent months he has become a nerd.  Stating maybe have always been under good.  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  This is something that he legitimately believes he can engage in and create an income from.  Suicidal/Homicidal: Nowithout intent/plan  Therapist Response:     Intervention/Plan: LCSW utilized psychoanalytic therapy for patient to express thoughts, feelings and concerns and nonjudgmental environment.  LCSW utilized supportive therapy for praise and encouragement.  LCSW utilized cognitive behavioral therapy for cognitive restructuring and reframing.  LCSW utilized person centered therapy for empowerment.  Plan: Return again in 4 weeks.  Diagnosis: Major depressive disorder, recurrent  episode, moderate (HCC)  Generalized anxiety disorder  Collaboration of Care: Other Continued individual therapy   Patient/Guardian was advised Release of Information must be obtained prior to any record release in order to collaborate their care with an outside provider. Patient/Guardian was advised if they have not already done so to contact the registration department to sign all necessary forms in order for us  to release information regarding their care.   Consent: Patient/Guardian gives verbal consent for treatment and assignment of benefits for services provided during this visit. Patient/Guardian expressed understanding and agreed to proceed.   Juliene GORMAN Patee, LCSW 08/07/2024

## 2024-08-17 ENCOUNTER — Other Ambulatory Visit (HOSPITAL_COMMUNITY): Payer: Self-pay

## 2024-08-17 ENCOUNTER — Other Ambulatory Visit: Payer: Self-pay

## 2024-09-04 ENCOUNTER — Ambulatory Visit (INDEPENDENT_AMBULATORY_CARE_PROVIDER_SITE_OTHER): Admitting: Licensed Clinical Social Worker

## 2024-09-04 DIAGNOSIS — F6381 Intermittent explosive disorder: Secondary | ICD-10-CM

## 2024-09-04 DIAGNOSIS — F411 Generalized anxiety disorder: Secondary | ICD-10-CM | POA: Diagnosis not present

## 2024-09-04 DIAGNOSIS — F5104 Psychophysiologic insomnia: Secondary | ICD-10-CM

## 2024-09-04 DIAGNOSIS — F331 Major depressive disorder, recurrent, moderate: Secondary | ICD-10-CM
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
## 2024-09-11 ENCOUNTER — Encounter (HOSPITAL_COMMUNITY): Admitting: Psychiatry
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
## 2024-09-13 ENCOUNTER — Other Ambulatory Visit: Payer: Self-pay

## 2024-09-13 ENCOUNTER — Other Ambulatory Visit (HOSPITAL_COMMUNITY): Payer: Self-pay

## 2024-09-13 ENCOUNTER — Ambulatory Visit (INDEPENDENT_AMBULATORY_CARE_PROVIDER_SITE_OTHER)

## 2024-09-13 DIAGNOSIS — F5104 Psychophysiologic insomnia: Secondary | ICD-10-CM | POA: Diagnosis not present

## 2024-09-13 DIAGNOSIS — F331 Major depressive disorder, recurrent, moderate: Secondary | ICD-10-CM

## 2024-09-13 MED ORDER — PROPRANOLOL HCL 10 MG PO TABS
10.0000 mg | ORAL_TABLET | Freq: Two times a day (BID) | ORAL | 0 refills | Status: AC | PRN
  Filled 2024-09-13 (×3): qty 180, 90d supply, fill #0

## 2024-09-13 MED ORDER — ACETAMINOPHEN 500 MG PO TABS
1000.0000 mg | ORAL_TABLET | Freq: Four times a day (QID) | ORAL | 0 refills | Status: AC | PRN
  Filled 2024-09-13: qty 30, 4d supply, fill #0

## 2024-09-13 MED ORDER — MELATONIN 3 MG PO TABS
3.0000 mg | ORAL_TABLET | Freq: Every day | ORAL | 0 refills | Status: DC
  Filled 2024-09-13: qty 120, 120d supply, fill #0
  Filled 2024-09-13: qty 31, 31d supply, fill #0
  Filled 2024-09-13: qty 60, 60d supply, fill #0

## 2024-09-13 MED ORDER — BUPROPION HCL ER (XL) 150 MG PO TB24
150.0000 mg | ORAL_TABLET | ORAL | 0 refills | Status: DC
  Filled 2024-09-13 – 2024-11-07 (×2): qty 90, 90d supply, fill #0

## 2024-09-13 MED ORDER — TRAZODONE HCL 50 MG PO TABS
50.0000 mg | ORAL_TABLET | Freq: Every evening | ORAL | 0 refills | Status: DC | PRN
  Filled 2024-09-13: qty 90, 90d supply, fill #0

## 2024-09-21 ENCOUNTER — Other Ambulatory Visit (HOSPITAL_COMMUNITY): Payer: Self-pay

## 2024-09-21 ENCOUNTER — Other Ambulatory Visit: Payer: Self-pay

## 2024-09-26 ENCOUNTER — Other Ambulatory Visit: Payer: Self-pay

## 2024-09-26 MED ORDER — PREDNISONE 10 MG PO TABS
ORAL_TABLET | ORAL | 0 refills | Status: AC
  Filled 2024-09-26: qty 48, 12d supply, fill #0

## 2024-09-27 ENCOUNTER — Other Ambulatory Visit: Payer: Self-pay

## 2024-10-02 ENCOUNTER — Other Ambulatory Visit (HOSPITAL_COMMUNITY): Payer: Self-pay

## 2024-10-02 ENCOUNTER — Ambulatory Visit (INDEPENDENT_AMBULATORY_CARE_PROVIDER_SITE_OTHER): Admitting: Licensed Clinical Social Worker

## 2024-10-02 ENCOUNTER — Other Ambulatory Visit (HOSPITAL_BASED_OUTPATIENT_CLINIC_OR_DEPARTMENT_OTHER): Payer: Self-pay

## 2024-10-02 ENCOUNTER — Other Ambulatory Visit: Payer: Self-pay

## 2024-10-02 DIAGNOSIS — F411 Generalized anxiety disorder: Secondary | ICD-10-CM

## 2024-10-02 DIAGNOSIS — F331 Major depressive disorder, recurrent, moderate: Secondary | ICD-10-CM

## 2024-10-02 MED ORDER — DIAZEPAM 10 MG PO TABS
10.0000 mg | ORAL_TABLET | Freq: Once | ORAL | 0 refills | Status: AC
  Filled 2024-10-02 (×2): qty 1, 1d supply, fill #0

## 2024-10-02 NOTE — Progress Notes (Signed)
 THERAPIST PROGRESS NOTE  Session Time: 16  Participation Level: Active  Behavioral Response: CasualAlertAnxious and Depressed  Type of Therapy: Individual Therapy  Treatment Goals addressed:  Active     Bipolar Disorder CCP Problem  1 bipolar disorder depressed      Decrease PHQ-9 below 10  (Progressing)     Start:  12/08/21    Expected End:  10/26/24         Decrease GAD-7 below 5  (Progressing)     Start:  12/08/21    Expected End:  10/26/24         LTG: Stabilize mood and increase goal-directed behavior: Using positive affirmation 1 x daily  (Progressing)     Start:  12/08/21    Expected End:  10/26/24         STG: Meshilem WILL COOPERATE WITH A PSYCHIATRIC EVALUATION ON 04/28/21 AND DURING ALL FOLLOW-UP VISITS (Completed/Met)     Start:  12/08/21    Expected End:  05/21/22    Resolved:  12/30/21      STG: [name]     Start:  12/08/21    Expected End:  10/26/24         STG: Jaxan WILL ATTEND AT LEAST 80% OF SCHEDULED FOLLOW-UP MEDICATION MANAGEMENT APPOINTMENTS (Completed/Met)     Start:  12/08/21    Expected End:  11/26/22    Resolved:  09/07/22       Decrease marijuana use to 0/7 days per week  (Progressing)     Start:  06/02/22    Expected End:  10/26/24         WORK WITH Tyson TO DISCUSS RISKS AND BENEFITS OF MEDICATION TREATMENT OPTIONS FOR THIS PROBLEM AND PRESCRIBE AS INDICATED (Completed)     Start:  12/08/21    End:  02/16/22      Conduct a review of all medications to evaluate any drug/drug interactions (Completed)     Start:  12/08/21    End:  02/16/22      REVIEW WITH Stace THEIR RESPONSE TO THE PRESCRIBED MEDICATION, INCLUDING ANY SIDE EFFECTS (Completed)     Start:  12/08/21    End:  02/16/22      ENCOURAGE Aerion TO KEEP A LOG OF MEDICATION SIDE EFFECTS, QUESTIONS REGARDING MEDICATION, AND SYMPTOMS (Completed)     Start:  12/08/21     End:  02/16/22      INSTRUCT Fern TO TAKE PSYCHOTROPIC MEDICATION AS PRESCRIBED (Completed)     Start:  12/08/21    End:  02/16/22      INSTRUCT Jaise TO COMMUNICATE EFFECTS OF PRESCRIBED MEDICATIONS (Completed)     Start:  12/08/21    End:  02/16/22      EDUCATE Darold ON BIPOLAR DISORDER DIAGNOSTIC CRITERIA (Completed)     Start:  12/08/21    End:  02/16/22         ProgressTowards Goals: Progressing  Interventions: CBT, Motivational Interviewing, and Supportive    Suicidal/Homicidal: Nowithout intent/plan  Therapist Response:   S: Subjective  Chistopher presents alert and oriented 5. He reports that things have been going "well." He states, "At the start of the year I was not very optimistic, but toward the end of the year things have really ramped up," as evidenced by obtaining items he wanted, including a PS4 for playing WWE 2024 and a [employer] Pixel 9. He expresses excitement about the phone's expected seven years of software  updates compared to other brands, which he reports often limit updates after three to four generations.  Terral states he is beginning to explore future career goals, particularly in technology or an office-based setting. He reports that he is working on expanding his education through chiropodist courses and hopes this will help him move out of his current job at a [employer]. He states he has been using [employer] as an "AI companion" to support his learning. LCSW and patient discussed the pros and cons of AI systems.  Patient and LCSW explored career planning, including pursuing certifications (e.g., [employer], Python) and identifying keywords for job applications. He denies suicidal or homicidal ideation, as well as auditory or visual hallucinations. He endorses symptoms of tension, worry, and insomnia. Patient verbally agreed to transfer to a new therapist at [facility] due to LCSW relocating to the  [facility] office.  O: Objective  Alert and oriented 5  Pleasant, cooperative, maintained good eye contact  Casually dressed; engaged well in session  Mood anxious; affect appropriate  Demonstrates motivation for self-improvement and future-oriented thinking  No evidence of psychosis or acute safety risk  A: Assessment Gurshaan presents with anxiety symptoms characterized by tension, worry, and insomnia. He appears future-focused and motivated to improve his career and educational trajectory. He demonstrates good insight and active engagement in therapy. No current safety concerns noted.  P: Plan  Continue psychodynamic therapy to explore thoughts, feelings, and emotions in a nonjudgmental environment.  Utilize cognitive behavioral therapy for reframing and cognitive restructuring.  Support patient's career exploration and encourage continued engagement in certification courses.  Encourage healthy lifestyle habits, including regular exercise and proper nutrition.  Educate patient on advocating for his physical health (arthritis, back pain, oral health).  Proceed with transfer to a new therapist at [facility]; patient verbally agreed and will await scheduling call.   Diagnosis: Major depressive disorder, recurrent episode, moderate (HCC)  Generalized anxiety disorder  Collaboration of Care: Other Transfer to new therapist at [facility]   Patient/Guardian was advised Release of Information must be obtained prior to any record release in order to collaborate their care with an outside provider. Patient/Guardian was advised if they have not already done so to contact the registration department to sign all necessary forms in order for us  to release information regarding their care.   Consent: Patient/Guardian gives verbal consent for treatment and assignment of benefits for services provided during this visit.  Patient/Guardian expressed understanding and agreed to proceed.   Juliene GORMAN Patee, LCSW 10/02/2024

## 2024-10-04 ENCOUNTER — Other Ambulatory Visit (HOSPITAL_BASED_OUTPATIENT_CLINIC_OR_DEPARTMENT_OTHER): Payer: Self-pay

## 2024-10-04 ENCOUNTER — Other Ambulatory Visit: Payer: Self-pay

## 2024-10-04 MED ORDER — METHYLPREDNISOLONE 4 MG PO TBPK
ORAL_TABLET | ORAL | 0 refills | Status: AC
  Filled 2024-10-04: qty 21, 6d supply, fill #0

## 2024-10-04 MED ORDER — AMOXICILLIN 500 MG PO CAPS
500.0000 mg | ORAL_CAPSULE | Freq: Three times a day (TID) | ORAL | 0 refills | Status: AC
  Filled 2024-10-04: qty 15, 5d supply, fill #0

## 2024-10-04 MED ORDER — HYDROCODONE-ACETAMINOPHEN 10-325 MG PO TABS
1.0000 | ORAL_TABLET | Freq: Four times a day (QID) | ORAL | 0 refills | Status: AC | PRN
  Filled 2024-10-04: qty 12, 3d supply, fill #0

## 2024-10-04 MED ORDER — HYDROCODONE-ACETAMINOPHEN 10-325 MG PO TABS
1.0000 | ORAL_TABLET | Freq: Four times a day (QID) | ORAL | 0 refills | Status: AC
  Filled 2024-10-04: qty 12, 3d supply, fill #0

## 2024-10-26 ENCOUNTER — Other Ambulatory Visit: Payer: Self-pay
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
## 2024-10-30 ENCOUNTER — Ambulatory Visit (INDEPENDENT_AMBULATORY_CARE_PROVIDER_SITE_OTHER)

## 2024-10-30 ENCOUNTER — Ambulatory Visit (HOSPITAL_COMMUNITY)

## 2024-10-30 DIAGNOSIS — F331 Major depressive disorder, recurrent, moderate: Secondary | ICD-10-CM

## 2024-10-30 DIAGNOSIS — F411 Generalized anxiety disorder: Secondary | ICD-10-CM

## 2024-10-30 NOTE — Progress Notes (Signed)
" ° °  THERAPIST PROGRESS NOTE  Session Time: 11:05am - 12:01pm  Participation Level: Active  Behavioral Response: Neat Alert Negative and Irritable  Type of Therapy: Individual Therapy  Treatment Goals addressed:    LTG: Stabilize mood and increase goal-directed behavior: Using positive affirmation 1 x daily  (Progressing)   STG: Regino WILL IDENTIFY COGNITIVE PATTERNS AND BELIEFS THAT INTERFERE WITH THERAPY       ProgressTowards Goals: Progressing  Interventions: CBT, Motivational Interviewing, and Anger Management Training  Summary: Baila Rouse Kappes is a 39 y.o. male who presents as alert and oriented 5. He reports that things have been going well, although he expresses frustration with his current job and desires a change. Teller is beginning to explore future career goals, specifically in technology or an office-based setting. He is actively working on expanding his education through chiropodist courses, hoping this will facilitate his transition from his current position at a [employer]. He mentions utilizing [employer] as an AI companion to aid in his learning process. During the session, the LCSW and Eliah discussed the advantages and disadvantages of AI systems.The patient and the LCSW engaged in career planning, focusing on the pursuit of certifications and identifying key terms for job applications. Jameer denies any suicidal or homicidal ideation, as well as experiencing auditory or visual hallucinations. He acknowledges symptoms of tension, worry, and insomnia, reporting feelings of anger and frustration at times. While he expresses a desire to hit things, he also recognizes the importance of maintaining control and refrains from acting on these impulses..   Suicidal/Homicidal: No without intent/plan  Therapist Response: The LCSW utilized Cognitive Behavioral Therapy (CBT) techniques to help Dewel identify and work through his negative thoughts  surrounding his job and frustrations. Motivational Interviewing (MI) was employed to facilitate open-ended discussions about his current employment and future career aspirations. The LCSW provided psychoeducation on understanding anger, emphasizing the importance of developing healthy coping skills. Additionally, a worksheet on anger management was provided to [place], equipping him with practical strategies to better manage his emotions and responses. This approach aimed to empower him in navigating his feelings and enhancing his coping mechanisms as he contemplates his career transition.  Plan: Return again in 4 weeks.  Diagnosis:   Generalized anxiety disorder  Major depressive disorder, recurrent episode, moderate (HCC)  Collaboration of Care: Other None  Patient/Guardian was advised Release of Information must be obtained prior to any record release in order to collaborate their care with an outside provider. Patient/Guardian was advised if they have not already done so to contact the registration department to sign all necessary forms in order for us  to release information regarding their care.   Consent: Patient/Guardian gives verbal consent for treatment and assignment of benefits for services provided during this visit. Patient/Guardian expressed understanding and agreed to proceed.   Sanjit Mcmichael L. Robynn, LCSW  10/30/2024  "

## 2024-11-07 ENCOUNTER — Other Ambulatory Visit: Payer: Self-pay

## 2024-11-07 ENCOUNTER — Other Ambulatory Visit (HOSPITAL_COMMUNITY): Payer: Self-pay

## 2024-11-08 ENCOUNTER — Encounter (HOSPITAL_COMMUNITY)
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
## 2024-11-22 ENCOUNTER — Other Ambulatory Visit (HOSPITAL_COMMUNITY): Payer: Self-pay

## 2024-11-22 ENCOUNTER — Other Ambulatory Visit: Payer: Self-pay

## 2024-11-22 ENCOUNTER — Ambulatory Visit (HOSPITAL_COMMUNITY)

## 2024-11-22 DIAGNOSIS — F5104 Psychophysiologic insomnia: Secondary | ICD-10-CM

## 2024-11-22 DIAGNOSIS — F331 Major depressive disorder, recurrent, moderate: Secondary | ICD-10-CM

## 2024-11-22 MED ORDER — MELATONIN 3 MG PO TABS
3.0000 mg | ORAL_TABLET | Freq: Every day | ORAL | 0 refills | Status: AC
  Filled 2024-11-22 (×2): qty 90, 90d supply, fill #0

## 2024-11-22 MED ORDER — BUPROPION HCL ER (XL) 150 MG PO TB24
150.0000 mg | ORAL_TABLET | ORAL | 0 refills | Status: AC
  Filled 2024-11-22: qty 90, 90d supply, fill #0

## 2024-11-22 MED ORDER — TRAZODONE HCL 50 MG PO TABS
50.0000 mg | ORAL_TABLET | Freq: Every evening | ORAL | 0 refills | Status: AC | PRN
  Filled 2024-11-22 (×2): qty 90, 90d supply, fill #0

## 2024-11-29 ENCOUNTER — Other Ambulatory Visit (HOSPITAL_COMMUNITY): Payer: Self-pay

## 2024-11-29 ENCOUNTER — Ambulatory Visit (INDEPENDENT_AMBULATORY_CARE_PROVIDER_SITE_OTHER)

## 2024-11-29 DIAGNOSIS — F411 Generalized anxiety disorder: Secondary | ICD-10-CM

## 2024-11-29 DIAGNOSIS — F331 Major depressive disorder, recurrent, moderate: Secondary | ICD-10-CM | POA: Diagnosis not present

## 2024-11-29 DIAGNOSIS — F3161 Bipolar disorder, current episode mixed, mild: Secondary | ICD-10-CM

## 2024-11-29 NOTE — Progress Notes (Signed)
 "  THERAPIST PROGRESS NOTE  Virtual Visit via Video Note  I connected with Dougles Nierenberg on 11/29/24 at 11:00 AM EST by a video enabled telemedicine application and verified that I am speaking with the correct person using two identifiers.  Location: Patient: Location on file Provider: [facility]   I discussed the limitations of evaluation and management by telemedicine and the availability of in person appointments. The patient expressed understanding and agreed to proceed.   I discussed the assessment and treatment plan with the patient. The patient was provided an opportunity to ask questions and all were answered. The patient agreed with the plan and demonstrated an understanding of the instructions.   The patient was advised to call back or seek an in-person evaluation if the symptoms worsen or if the condition fails to improve as anticipated.  I provided 53 minutes of non-face-to-face time during this encounter.   Mahrosh Donnell L. Ruwayda Curet, LCSW   Session Time: 10:59am - 11:52am  Participation Level: Active  Behavioral Response: Neat Alert Negative and Angry  Type of Therapy: Individual Therapy  Treatment Goals addressed:   LTG: Stabilize mood and increase goal-directed behavior: Using positive affirmation 1 x daily  (Progressing)    STG: [place] WILL IDENTIFY COGNITIVE PATTERNS AND BELIEFS THAT INTERFERE WITH THERAPY   \ ProgressTowards Goals: Progressing  Interventions: CBT, Motivational Interviewing, and Supportive  Summary: Rahkeem Rindfleisch is a 39 y.o. male who presents with diagnoses of Bipolar Disorder I, Major Depressive Disorder (MDD), and Generalized Anxiety Disorder (GAD). He expresses frustration with his work and some of his friendships. During the session, the therapist employed open-ended questions to gain deeper insights into his feelings. Kelsie shares dissatisfaction with his job, citing concerns about upper management's lack of control, inconsistent safety  protocols, and a belief that someone could be injured as a result. He recently had a job interview, which the therapist praised as a proactive step.Robel also discusses tensions with friends who frequently engage in political discussions. He uses AI to grasp these topics, but a friend's comment labeling him as stupid emerged as a trigger for his anger. This feeling is compounded by [place]'s past experiences of feeling inadequate in his learning abilities. The therapist provides psychoeducation about learning styles, emphasizing that everyone processes information differently.Using Cognitive Behavioral Therapy (CBT) techniques, the therapist helps Gad process his negative thought patterns and explores strategies to alter them. Asante agrees to practice reframing these thoughts. Throughout the session, he remains engaged and alert, reporting no suicidal or homicidal ideation.   Suicidal/Homicidal: No without intent/plan  Therapist Response: the therapist utilized open-ended questions to facilitate deeper engagement and understanding of Tait's feelings and experiences. The therapist validated Yidel's frustrations regarding his job and interpersonal relationships, acknowledging the impact of these stressors on his mental health. To address Johnwesley's concerns about not feeling understood, the therapist provided psychoeducation on different learning styles, emphasizing that individuals process information in various ways. Additionally, the therapist employed Cognitive Behavioral Therapy (CBT) techniques to help Clovis identify and reframe negative thought patterns. Through these approaches, the therapist created a supportive environment that encouraged Ebbie to practice new strategies for managing his thoughts and emotions effectively.  Plan: Return again in 4 weeks.  Diagnosis:   MDD (major depressive disorder), recurrent episode, moderate (HCC)  Generalized anxiety  disorder  Bipolar disorder, current episode mixed, mild (HCC)  Collaboration of Care: Other None  Patient/Guardian was advised Release of Information must be obtained prior to any record release in order to collaborate their care  with an outside provider. Patient/Guardian was advised if they have not already done so to contact the registration department to sign all necessary forms in order for us  to release information regarding their care.   Consent: Patient/Guardian gives verbal consent for treatment and assignment of benefits for services provided during this visit. Patient/Guardian expressed understanding and agreed to proceed.   Amadou Katzenstein L. Robynn, LCSW  11/29/2024  "

## 2025-01-03 ENCOUNTER — Ambulatory Visit (HOSPITAL_COMMUNITY)

## 2025-01-10 ENCOUNTER — Encounter (HOSPITAL_COMMUNITY)

## 2025-01-17 ENCOUNTER — Ambulatory Visit (HOSPITAL_COMMUNITY)
